# Patient Record
Sex: Female | Born: 1985 | Race: White | Hispanic: No | Marital: Married | Smoking: Never smoker
Health system: Southern US, Community
[De-identification: ages and names within clinical notes are randomized; demographics above are authoritative.]

## PROBLEM LIST (undated history)

## (undated) ENCOUNTER — Inpatient Hospital Stay (HOSPITAL_COMMUNITY): Payer: Self-pay

## (undated) DIAGNOSIS — D649 Anemia, unspecified: Secondary | ICD-10-CM

## (undated) DIAGNOSIS — O26649 Intrahepatic cholestasis of pregnancy, unspecified trimester: Secondary | ICD-10-CM

## (undated) DIAGNOSIS — R030 Elevated blood-pressure reading, without diagnosis of hypertension: Secondary | ICD-10-CM

## (undated) DIAGNOSIS — F32A Depression, unspecified: Secondary | ICD-10-CM

## (undated) DIAGNOSIS — O24419 Gestational diabetes mellitus in pregnancy, unspecified control: Secondary | ICD-10-CM

## (undated) DIAGNOSIS — Z974 Presence of external hearing-aid: Secondary | ICD-10-CM

## (undated) DIAGNOSIS — N920 Excessive and frequent menstruation with regular cycle: Secondary | ICD-10-CM

## (undated) DIAGNOSIS — N946 Dysmenorrhea, unspecified: Secondary | ICD-10-CM

## (undated) DIAGNOSIS — F419 Anxiety disorder, unspecified: Secondary | ICD-10-CM

## (undated) DIAGNOSIS — Z973 Presence of spectacles and contact lenses: Secondary | ICD-10-CM

## (undated) DIAGNOSIS — T7840XA Allergy, unspecified, initial encounter: Secondary | ICD-10-CM

## (undated) DIAGNOSIS — K831 Obstruction of bile duct: Secondary | ICD-10-CM

## (undated) DIAGNOSIS — R011 Cardiac murmur, unspecified: Secondary | ICD-10-CM

## (undated) DIAGNOSIS — E559 Vitamin D deficiency, unspecified: Secondary | ICD-10-CM

## (undated) DIAGNOSIS — H9190 Unspecified hearing loss, unspecified ear: Secondary | ICD-10-CM

## (undated) DIAGNOSIS — O26619 Liver and biliary tract disorders in pregnancy, unspecified trimester: Secondary | ICD-10-CM

## (undated) DIAGNOSIS — I10 Essential (primary) hypertension: Secondary | ICD-10-CM

## (undated) DIAGNOSIS — E538 Deficiency of other specified B group vitamins: Secondary | ICD-10-CM

## (undated) DIAGNOSIS — H6993 Unspecified Eustachian tube disorder, bilateral: Secondary | ICD-10-CM

## (undated) DIAGNOSIS — E119 Type 2 diabetes mellitus without complications: Secondary | ICD-10-CM

## (undated) DIAGNOSIS — F909 Attention-deficit hyperactivity disorder, unspecified type: Secondary | ICD-10-CM

## (undated) DIAGNOSIS — R7303 Prediabetes: Secondary | ICD-10-CM

## (undated) DIAGNOSIS — M1990 Unspecified osteoarthritis, unspecified site: Secondary | ICD-10-CM

## (undated) DIAGNOSIS — F329 Major depressive disorder, single episode, unspecified: Secondary | ICD-10-CM

## (undated) DIAGNOSIS — K219 Gastro-esophageal reflux disease without esophagitis: Secondary | ICD-10-CM

## (undated) HISTORY — PX: CHOLECYSTECTOMY: SHX55

## (undated) HISTORY — DX: Prediabetes: R73.03

## (undated) HISTORY — PX: WISDOM TOOTH EXTRACTION: SHX21

## (undated) HISTORY — DX: Unspecified osteoarthritis, unspecified site: M19.90

## (undated) HISTORY — DX: Gastro-esophageal reflux disease without esophagitis: K21.9

## (undated) HISTORY — DX: Unspecified hearing loss, unspecified ear: H91.90

## (undated) HISTORY — DX: Depression, unspecified: F32.A

## (undated) HISTORY — DX: Anxiety disorder, unspecified: F41.9

## (undated) HISTORY — PX: TONSILLECTOMY: SUR1361

## (undated) HISTORY — DX: Allergy, unspecified, initial encounter: T78.40XA

## (undated) HISTORY — DX: Gestational diabetes mellitus in pregnancy, unspecified control: O24.419

## (undated) HISTORY — DX: Attention-deficit hyperactivity disorder, unspecified type: F90.9

## (undated) HISTORY — DX: Cardiac murmur, unspecified: R01.1

## (undated) HISTORY — DX: Essential (primary) hypertension: I10

## (undated) HISTORY — DX: Major depressive disorder, single episode, unspecified: F32.9

---
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
## 2007-01-12 ENCOUNTER — Emergency Department (HOSPITAL_COMMUNITY): Admission: EM | Admit: 2007-01-12 | Discharge: 2007-01-13 | Payer: Self-pay | Admitting: Emergency Medicine

## 2007-11-25 HISTORY — PX: LAPAROSCOPIC CHOLECYSTECTOMY: SUR755

## 2011-01-07 ENCOUNTER — Other Ambulatory Visit (HOSPITAL_COMMUNITY)
Admission: RE | Admit: 2011-01-07 | Discharge: 2011-01-07 | Disposition: A | Discharge status: Home / Self Care | Payer: BC Managed Care – PPO | Source: Ambulatory Visit | Attending: Family Medicine | Admitting: Family Medicine

## 2011-01-07 DIAGNOSIS — Z124 Encounter for screening for malignant neoplasm of cervix: Secondary | ICD-10-CM | POA: Insufficient documentation

## 2011-11-25 ENCOUNTER — Ambulatory Visit (INDEPENDENT_AMBULATORY_CARE_PROVIDER_SITE_OTHER): Payer: BC Managed Care – PPO

## 2011-11-25 DIAGNOSIS — N3000 Acute cystitis without hematuria: Secondary | ICD-10-CM

## 2012-02-19 ENCOUNTER — Other Ambulatory Visit (HOSPITAL_COMMUNITY)
Admission: RE | Admit: 2012-02-19 | Discharge: 2012-02-19 | Disposition: A | Discharge status: Home / Self Care | Payer: BC Managed Care – PPO | Source: Ambulatory Visit | Attending: Family Medicine | Admitting: Family Medicine

## 2012-02-19 DIAGNOSIS — Z124 Encounter for screening for malignant neoplasm of cervix: Secondary | ICD-10-CM | POA: Insufficient documentation

## 2013-03-24 ENCOUNTER — Ambulatory Visit (INDEPENDENT_AMBULATORY_CARE_PROVIDER_SITE_OTHER): Payer: BC Managed Care – PPO | Admitting: Internal Medicine

## 2013-03-24 VITALS — BP 130/86 | HR 77 | Temp 98.3°F | Resp 18 | Wt 265.0 lb

## 2013-03-24 DIAGNOSIS — R6883 Chills (without fever): Secondary | ICD-10-CM

## 2013-03-24 DIAGNOSIS — M25559 Pain in unspecified hip: Secondary | ICD-10-CM

## 2013-03-24 DIAGNOSIS — R1110 Vomiting, unspecified: Secondary | ICD-10-CM

## 2013-03-24 DIAGNOSIS — M25569 Pain in unspecified knee: Secondary | ICD-10-CM

## 2013-03-24 DIAGNOSIS — K529 Noninfective gastroenteritis and colitis, unspecified: Secondary | ICD-10-CM

## 2013-03-24 LAB — POCT CBC
Granulocyte percent: 77.8 %G (ref 37–80)
HCT, POC: 45.2 % (ref 37.7–47.9)
Hemoglobin: 14.6 g/dL (ref 12.2–16.2)
Lymph, poc: 1.1 (ref 0.6–3.4)
MCV: 88.7 fL (ref 80–97)
POC Granulocyte: 5 (ref 2–6.9)
POC LYMPH PERCENT: 17.6 %L (ref 10–50)
RDW, POC: 13.9 %

## 2013-03-24 LAB — POCT UA - MICROSCOPIC ONLY
Casts, Ur, LPF, POC: NEGATIVE
Mucus, UA: NEGATIVE
RBC, urine, microscopic: NEGATIVE

## 2013-03-24 LAB — POCT URINALYSIS DIPSTICK
Bilirubin, UA: NEGATIVE
Glucose, UA: NEGATIVE
Leukocytes, UA: NEGATIVE
Nitrite, UA: NEGATIVE
pH, UA: 6.5

## 2013-03-24 LAB — POCT URINE PREGNANCY: Preg Test, Ur: NEGATIVE

## 2013-03-24 MED ORDER — IBUPROFEN 600 MG PO TABS: 600.0000 mg | ORAL_TABLET | Freq: Three times a day (TID) | ORAL | Status: DC | PRN

## 2013-03-24 MED ORDER — ONDANSETRON HCL 8 MG PO TABS: 8.0000 mg | ORAL_TABLET | Freq: Three times a day (TID) | ORAL | Status: DC | PRN

## 2013-03-24 NOTE — Progress Notes (Signed)
  Subjective:    Patient ID: Tanya Dorsey, female    DOB: [birth year], 27 y.o.   MRN: [id number]  HPI Nausea and vomiting, no diarrhea or abd pain, has chills and myalgias and skin hurts.    Review of Systems     Objective:   Physical Exam  Vitals reviewed. Constitutional: She is oriented to person, place, and time. She appears well-developed and well-nourished. No distress.  HENT:  Right Ear: External ear normal.  Left Ear: External ear normal.  Nose: Nose normal.  Mouth/Throat: Oropharynx is clear and moist.  Eyes: EOM are normal. Pupils are equal, round, and reactive to light. No scleral icterus.  Neck: Neck supple. No thyromegaly present.  Cardiovascular: Normal rate, regular rhythm and normal heart sounds.   Pulmonary/Chest: Effort normal and breath sounds normal.  Abdominal: Soft. Bowel sounds are normal. She exhibits no distension and no mass. There is tenderness. There is no rebound and no guarding.  Musculoskeletal: Normal range of motion. She exhibits tenderness.  Neurological: She is alert and oriented to person, place, and time. She exhibits normal muscle tone. Coordination normal.  Skin: Skin is warm and dry. No rash noted.  Psychiatric: She has a normal mood and affect. Her behavior is normal.     Results for orders placed in visit on 03/24/13  POCT CBC      Result Value Range   WBC 6.4  4.6 - 10.2 K/uL   Lymph, poc 1.1  0.6 - 3.4   POC LYMPH PERCENT 17.6  10 - 50 %L   MID (cbc) 0.3  0 - 0.9   POC MID % 4.6  0 - 12 %M   POC Granulocyte 5.0  2 - 6.9   Granulocyte percent 77.8  37 - 80 %G   RBC 5.10  4.04 - 5.48 M/uL   Hemoglobin 14.6  12.2 - 16.2 g/dL   HCT, POC 44.0  10.2 - 47.9 %   MCV 88.7  80 - 97 fL   MCH, POC 28.6  27 - 31.2 pg   MCHC 32.3  31.8 - 35.4 g/dL   RDW, POC 72.5     Platelet Count, POC 311  142 - 424 K/uL   MPV 9.7  0 - 99.8 fL  POCT URINE PREGNANCY      Result Value Range   Preg Test, Ur Negative    POCT URINALYSIS DIPSTICK   Result Value Range   Color, UA yellow     Clarity, UA clear     Glucose, UA neg     Bilirubin, UA neg     Ketones, UA trace     Spec Grav, UA 1.015     Blood, UA neg     pH, UA 6.5     Protein, UA neg     Urobilinogen, UA 0.2     Nitrite, UA neg     Leukocytes, UA Negative    POCT UA - MICROSCOPIC ONLY      Result Value Range   WBC, Ur, HPF, POC 0-1     RBC, urine, microscopic neg     Bacteria, U Microscopic trace     Mucus, UA neg     Epithelial cells, urine per micros 2-6     Crystals, Ur, HPF, POC neg     Casts, Ur, LPF, POC neg     Yeast, UA neg          Assessment & Plan:  Gastroenteritis Arthralgias

## 2013-03-24 NOTE — Patient Instructions (Signed)
Viral Gastroenteritis Viral gastroenteritis is also known as stomach flu. This condition affects the stomach and intestinal tract. It can cause sudden diarrhea and vomiting. The illness typically lasts 3 to 8 days. Most people develop an immune response that eventually gets rid of the virus. While this natural response develops, the virus can make you quite ill. CAUSES  Many different viruses can cause gastroenteritis, such as rotavirus or noroviruses. You can catch one of these viruses by consuming contaminated food or water. You may also catch a virus by sharing utensils or other personal items with an infected person or by touching a contaminated surface. SYMPTOMS  The most common symptoms are diarrhea and vomiting. These problems can cause a severe loss of body fluids (dehydration) and a body salt (electrolyte) imbalance. Other symptoms may include:  Fever.  Headache.  Fatigue.  Abdominal pain. DIAGNOSIS  Your caregiver can usually diagnose viral gastroenteritis based on your symptoms and a physical exam. A stool sample may also be taken to test for the presence of viruses or other infections. TREATMENT  This illness typically goes away on its own. Treatments are aimed at rehydration. The most serious cases of viral gastroenteritis involve vomiting so severely that you are not able to keep fluids down. In these cases, fluids must be given through an intravenous line (IV). HOME CARE INSTRUCTIONS   Drink enough fluids to keep your urine clear or pale yellow. Drink small amounts of fluids frequently and increase the amounts as tolerated.  Ask your caregiver for specific rehydration instructions.  Avoid:  Foods high in sugar.  Alcohol.  Carbonated drinks.  Tobacco.  Juice.  Caffeine drinks.  Extremely hot or cold fluids.  Fatty, greasy foods.  Too much intake of anything at one time.  Dairy products until 24 to 48 hours after diarrhea stops.  You may consume probiotics.  Probiotics are active cultures of beneficial bacteria. They may lessen the amount and number of diarrheal stools in adults. Probiotics can be found in yogurt with active cultures and in supplements.  Wash your hands well to avoid spreading the virus.  Only take over-the-counter or prescription medicines for pain, discomfort, or fever as directed by your caregiver. Do not give aspirin to children. Antidiarrheal medicines are not recommended.  Ask your caregiver if you should continue to take your regular prescribed and over-the-counter medicines.  Keep all follow-up appointments as directed by your caregiver. SEEK IMMEDIATE MEDICAL CARE IF:   You are unable to keep fluids down.  You do not urinate at least once every 6 to 8 hours.  You develop shortness of breath.  You notice blood in your stool or vomit. This may look like coffee grounds.  You have abdominal pain that increases or is concentrated in one small area (localized).  You have persistent vomiting or diarrhea.  You have a fever.  The patient is a child younger than 3 months, and he or she has a fever.  The patient is a child older than 3 months, and he or she has a fever and persistent symptoms.  The patient is a child older than 3 months, and he or she has a fever and symptoms suddenly get worse.  The patient is a baby, and he or she has no tears when crying. MAKE SURE YOU:   Understand these instructions.  Will watch your condition.  Will get help right away if you are not doing well or get worse. Document Released: 11/10/2005 Document Revised: 02/02/2012 Document Reviewed: 08/27/2011   ExitCare Patient Information 2013 [employer].  

## 2013-09-06 ENCOUNTER — Ambulatory Visit (INDEPENDENT_AMBULATORY_CARE_PROVIDER_SITE_OTHER): Payer: BC Managed Care – PPO | Admitting: Internal Medicine

## 2013-09-06 VITALS — BP 130/80 | HR 99 | Temp 98.8°F | Resp 16 | Ht 65.5 in | Wt 257.0 lb

## 2013-09-06 DIAGNOSIS — S61409A Unspecified open wound of unspecified hand, initial encounter: Secondary | ICD-10-CM

## 2013-09-06 DIAGNOSIS — M79609 Pain in unspecified limb: Secondary | ICD-10-CM

## 2013-09-06 DIAGNOSIS — S61401A Unspecified open wound of right hand, initial encounter: Secondary | ICD-10-CM

## 2013-09-06 NOTE — Progress Notes (Signed)
This chart was scribed for Tanya Sia, MD by Tanya Dorsey, ED Scribe. This patient was seen in room Room 7 and the patient's care was started at 2:28 PM   Subjective:    Patient ID: Tanya Dorsey, female    DOB: 1986/11/03, 27 y.o.   MRN: [id number]  HPI Tanya Dorsey is a 27 y.o. female who presents to the [facility] complaining of right pointer digit injury. Pt presents with a wound and controled bleeding. Pt states she was outdoors working in the yard and injured her finger on a ceramic metal bird feeder that shattered. Pts last tetanus was within the last 5 years. Pt has pain with bending.  There are no active problems to display for this patient.    Review of Systems  Skin: Positive for wound.      Objective:   Physical Exam  Nursing note and vitals reviewed. Constitutional: She is oriented to person, place, and time. She appears well-developed and well-nourished. No distress.  HENT:  Head: Normocephalic and atraumatic.  Eyes: Pupils are equal, round, and reactive to light.  Neck: Neck supple.  Cardiovascular: Normal rate and regular rhythm.   Pulmonary/Chest: Effort normal. No respiratory distress.  Musculoskeletal: Normal range of motion.  R index with 1.5-2 cm lac at rad aspect of 2nd PIP sens intact incl 2pt discr Tendon not visible Pain w/ extens ag resis jt space not invaded  Neurological: She is alert and oriented to person, place, and time.  Skin: Skin is warm and dry.  Psychiatric: She has a normal mood and affect. Her behavior is normal.    Proc by Tanya Dorsey    Assessment & Plan:  Wound finger  Wound care  sr 7-10d I have completed the patient encounter in its entirety as documented by the scribe, with editing by me where necessary. Tanya Dorsey P. Merla Riches, M.D.

## 2013-09-06 NOTE — Progress Notes (Signed)
Procedure Note: Verbal consent obtained from the patient.  Digital block with 3 cc Lidocaine 2% without epinephrine.  Wound scrubbed with soap and water.  Wound explored.  No foreign bodies or deep structure injury noted.  Wound closed with #3 sutures of 5-0 ethilon (#2 HM, #1SI).  Area cleansed and dressed.  Wound care discussed.  Pt tolerated very well.  Anticipate suture removal in 10 days

## 2013-09-06 NOTE — Patient Instructions (Signed)

## 2013-12-20 LAB — OB RESULTS CONSOLE ANTIBODY SCREEN: Antibody Screen: NEGATIVE

## 2013-12-20 LAB — OB RESULTS CONSOLE HIV ANTIBODY (ROUTINE TESTING): HIV: NONREACTIVE

## 2013-12-20 LAB — OB RESULTS CONSOLE RUBELLA ANTIBODY, IGM: RUBELLA: IMMUNE

## 2013-12-20 LAB — OB RESULTS CONSOLE HEPATITIS B SURFACE ANTIGEN: HEP B S AG: NEGATIVE

## 2013-12-20 LAB — OB RESULTS CONSOLE ABO/RH: ABO/RH(D): NEGATIVE

## 2013-12-20 LAB — OB RESULTS CONSOLE GC/CHLAMYDIA
CHLAMYDIA, DNA PROBE: NEGATIVE
Gonorrhea: NEGATIVE

## 2013-12-20 LAB — OB RESULTS CONSOLE RPR: RPR: NONREACTIVE

## 2013-12-31 ENCOUNTER — Encounter (HOSPITAL_COMMUNITY): Payer: Self-pay | Admitting: Obstetrics and Gynecology

## 2013-12-31 ENCOUNTER — Inpatient Hospital Stay (HOSPITAL_COMMUNITY)
Admission: AD | Admit: 2013-12-31 | Discharge: 2013-12-31 | Disposition: A | Discharge status: Home / Self Care | Payer: 59 | Source: Ambulatory Visit | Attending: Obstetrics and Gynecology | Admitting: Obstetrics and Gynecology

## 2013-12-31 DIAGNOSIS — O360990 Maternal care for other rhesus isoimmunization, unspecified trimester, not applicable or unspecified: Secondary | ICD-10-CM | POA: Insufficient documentation

## 2013-12-31 DIAGNOSIS — N390 Urinary tract infection, site not specified: Secondary | ICD-10-CM | POA: Insufficient documentation

## 2013-12-31 DIAGNOSIS — O209 Hemorrhage in early pregnancy, unspecified: Secondary | ICD-10-CM

## 2013-12-31 DIAGNOSIS — O26859 Spotting complicating pregnancy, unspecified trimester: Secondary | ICD-10-CM | POA: Insufficient documentation

## 2013-12-31 DIAGNOSIS — O2390 Unspecified genitourinary tract infection in pregnancy, unspecified trimester: Secondary | ICD-10-CM | POA: Insufficient documentation

## 2013-12-31 LAB — HCG, QUANTITATIVE, PREGNANCY: HCG, BETA CHAIN, QUANT, S: 14817 m[IU]/mL — AB (ref <5)

## 2013-12-31 LAB — ABO/RH: ABO/RH(D): O NEG

## 2013-12-31 MED ORDER — RHO D IMMUNE GLOBULIN 1500 UNIT/2ML IJ SOLN
300.0000 ug | Freq: Once | INTRAMUSCULAR | Status: AC
  Administered 2013-12-31: 300 ug via INTRAMUSCULAR
  Filled 2013-12-31: qty 2

## 2013-12-31 MED ORDER — AMPICILLIN 500 MG PO CAPS: 500.0000 mg | ORAL_CAPSULE | Freq: Three times a day (TID) | ORAL | Status: DC

## 2013-12-31 NOTE — MAU Provider Note (Signed)
History   CSN: [id number]  Arrival date and time: 12/31/13 1503   Chief Complaint  Patient presents with  . Labs Only   HPI Pt presents at 6w 5d for repeat quant hcg and rhogam as instructed by Dr. Stefano GaulStringer.  Pt reports scant amt of brown spotting this past Tuesday but none since.  She denies pain, weakness or cramping.  Per Dr. Debria GarretStringer's note, pt also to be treated for UTI due to positive urine culture with enterococcus.  Pt had been called by [facility] and messages left.  She returned the call this AM.   OB History   Grav Para Term Preterm Abortions TAB SAB Ect Mult Living   1               Past Medical History  Diagnosis Date  . Depression   . Hypertension     Past Surgical History  Procedure Laterality Date  . Cholecystectomy      Family History  Problem Relation Age of Onset  . Diabetes Mother   . Hypertension Mother   . Hypertension Father     History  Substance Use Topics  . Smoking status: Never Smoker   . Smokeless tobacco: Not on file  . Alcohol Use: No    Allergies: No Known Allergies  Prescriptions prior to admission  Medication Sig Dispense Refill  . BuPROPion HCl ER, XL, (FORFIVO XL) 450 MG TB24 Take by mouth.      Marland Kitchen. ibuprofen (ADVIL,MOTRIN) 600 MG tablet Take 1 tablet (600 mg total) by mouth every 8 (eight) hours as needed for pain.  30 tablet  0  . lisdexamfetamine (VYVANSE) 40 MG capsule Take 40 mg by mouth every morning.      . Norgestimate-Ethinyl Estradiol Triphasic (ORTHO TRI-CYCLEN LO) 0.18/0.215/0.25 MG-25 MCG tab Take 1 tablet by mouth daily.      . ondansetron (ZOFRAN) 8 MG tablet Take 1 tablet (8 mg total) by mouth every 8 (eight) hours as needed for nausea.  20 tablet  0    Review of Systems  Constitutional: Negative.   HENT: Negative.   Eyes: Negative.   Respiratory: Negative.   Cardiovascular: Negative.   Gastrointestinal: Negative.   Genitourinary: Negative.   Musculoskeletal: Negative.   Skin: Negative.   Neurological:  Negative.   Endo/Heme/Allergies: Negative.   Psychiatric/Behavioral: Negative.    Physical Exam   Blood pressure 133/82, pulse 98, temperature 98 F (36.7 C), resp. rate 18, last menstrual period 11/14/2013.  Physical Exam  Constitutional: She is oriented to person, place, and time. She appears well-developed and well-nourished.  HENT:  Head: Normocephalic.  Right Ear: External ear normal.  Left Ear: External ear normal.  Nose: Nose normal.  Eyes: Conjunctivae are normal. Pupils are equal, round, and reactive to light.  Neck: Normal range of motion.  Cardiovascular: Normal rate, regular rhythm and intact distal pulses.   Respiratory: Effort normal and breath sounds normal.  GI: Soft. Bowel sounds are normal. There is no tenderness.  Genitourinary:  Pelvic exam deferred  Musculoskeletal: Normal range of motion.  Neurological: She is alert and oriented to person, place, and time. She has normal reflexes.  Skin: Skin is warm and dry.  Psychiatric: She has a normal mood and affect. Her behavior is normal.    MAU Course  Procedures Results for orders placed during the hospital encounter of 12/31/13 (from the past 24 hour(s))  RH IG WORKUP (INCLUDES ABO/RH)     Status: None   Collection Time  12/31/13  3:32 PM      Result Value Range   Gestational Age(Wks) 6     ABO/RH(D) O NEG     Antibody Screen NEG     Unit Number [id number]     Blood Component Type RHIG     Unit division 00     Status of Unit ISSUED     Transfusion Status OK TO TRANSFUSE    HCG, QUANTITATIVE, PREGNANCY     Status: Abnormal   Collection Time    12/31/13  3:32 PM      Result Value Range   hCG, Beta Chain, Quant, S 14817 (*) <5 mIU/mL    Assessment and Plan  Pregnancy at 6w 5d with increased quant hCG Vaginal bleeding - resolved Rh negative Enterococcus UTI  Rhogam ordered and pt received. Rx Ampicillin 500mg  tid x 7 days to pharmacy with instructions to complete all medications. Pt to f/u  at [facility] within the upcoming week for ultrasound to confirm viability-pt to call office to sched appt on Monday  Teri Legacy O. 12/31/2013, 4:36 PM

## 2013-12-31 NOTE — MAU Note (Signed)
Pt presents for repeat labs and rhogam. Denies any pain but had some spotting on Tuesday that brownish in color

## 2013-12-31 NOTE — Discharge Instructions (Signed)
° °Pelvic Rest °Pelvic rest is sometimes recommended for women when:  °· The placenta is partially or completely covering the opening of the cervix (placenta previa). °· There is bleeding between the uterine wall and the amniotic sac in the first trimester (subchorionic hemorrhage). °· The cervix begins to open without labor starting (incompetent cervix, cervical insufficiency). °· The labor is too early (preterm labor). °HOME CARE INSTRUCTIONS °· Do not have sexual intercourse, stimulation, or an orgasm. °· Do not use tampons, douche, or put anything in the vagina. °· Do not lift anything over 10 pounds (4.5 kg). °· Avoid strenuous activity or straining your pelvic muscles. °SEEK MEDICAL CARE IF:  °· You have any vaginal bleeding during pregnancy. Treat this as a potential emergency. °· You have cramping pain felt low in the stomach (stronger than menstrual cramps). °· You notice vaginal discharge (watery, mucus, or bloody). °· You have a low, dull backache. °· There are regular contractions or uterine tightening. °SEEK IMMEDIATE MEDICAL CARE IF: °You have vaginal bleeding and have placenta previa.  °Document Released: 03/07/2011 Document Revised: 02/02/2012 Document Reviewed: 03/07/2011 °ExitCare® Patient Information ©2014 [employer]. ° °Vaginal Bleeding During Pregnancy, First Trimester °A small amount of bleeding (spotting) from the vagina is relatively common in early pregnancy. It usually stops on its own. Various things may cause bleeding or spotting in early pregnancy. Some bleeding may be related to the pregnancy, and some may not. In most cases, the bleeding is normal and is not a problem. However, bleeding can also be a sign of something serious. Be sure to tell your health care provider about any vaginal bleeding right away. °Some possible causes of vaginal bleeding during the first trimester include: °· Infection or inflammation of the cervix. °· Growths (polyps) on the cervix. °· Miscarriage or  threatened miscarriage. °· Pregnancy tissue has developed outside of the uterus and in a fallopian tube (tubal pregnancy). °· Tiny cysts have developed in the uterus instead of pregnancy tissue (molar pregnancy). °HOME CARE INSTRUCTIONS  °Watch your condition for any changes. The following actions may help to lessen any discomfort you are feeling: °· Follow your health care provider's instructions for limiting your activity. If your health care provider orders bed rest, you may need to stay in bed and only get up to use the bathroom. However, your health care provider may allow you to continue light activity. °· If needed, make plans for someone to help with your regular activities and responsibilities while you are on bed rest. °· Keep track of the number of pads you use each day, how often you change pads, and how soaked (saturated) they are. Write this down. °· Do not use tampons. Do not douche. °· Do not have sexual intercourse or orgasms until approved by your health care provider. °· If you pass any tissue from your vagina, save the tissue so you can show it to your health care provider. °· Only take over-the-counter or prescription medicines as directed by your health care provider. °· Do not take aspirin because it can make you bleed. °· Keep all follow-up appointments as directed by your health care provider. °SEEK MEDICAL CARE IF: °· You have any vaginal bleeding during any part of your pregnancy. °· You have cramps or labor pains. °SEEK IMMEDIATE MEDICAL CARE IF:  °· You have severe cramps in your back or belly (abdomen). °· You have a fever, not controlled by medicine. °· You pass large clots or tissue from your vagina. °· Your bleeding   increases. °· You feel lightheaded or weak, or you have fainting episodes. °· You have chills. °· You are leaking fluid or have a gush of fluid from your vagina. °· You pass out while having a bowel movement. °MAKE SURE YOU: °· Understand these instructions. °· Will watch  your condition. °· Will get help right away if you are not doing well or get worse. °Document Released: 08/20/2005 Document Revised: 08/31/2013 Document Reviewed: 07/18/2013 °ExitCare® Patient Information ©2014 [employer]. ° °

## 2014-01-01 LAB — RH IG WORKUP (INCLUDES ABO/RH)
ABO/RH(D): O NEG
Antibody Screen: NEGATIVE
GESTATIONAL AGE(WKS): 6
Unit division: 0

## 2014-06-28 ENCOUNTER — Encounter: Payer: 59 | Attending: Certified Nurse Midwife

## 2014-06-28 VITALS — Ht 64.0 in | Wt 270.8 lb

## 2014-06-28 DIAGNOSIS — Z713 Dietary counseling and surveillance: Secondary | ICD-10-CM | POA: Diagnosis present

## 2014-06-28 DIAGNOSIS — O99810 Abnormal glucose complicating pregnancy: Secondary | ICD-10-CM | POA: Insufficient documentation

## 2014-06-30 NOTE — Progress Notes (Signed)
  Patient was seen on 06/28/14 for Gestational Diabetes self-management class at the [facility]. The following learning objectives were met by the patient during this course:   States the definition of Gestational Diabetes  States why dietary management is important in controlling blood glucose  Describes the effects of carbohydrates on blood glucose levels  Demonstrates ability to create a balanced meal plan  Demonstrates carbohydrate counting   States when to check blood glucose levels  Demonstrates proper blood glucose monitoring techniques  States the effect of stress and exercise on blood glucose levels  States the importance of limiting caffeine and abstaining from alcohol and smoking  Plan:  Aim for 2 Carb Choices per meal (30 grams) +/- 1 either way for breakfast Aim for 3 Carb Choices per meal (45 grams) +/- 1 either way from lunch and dinner Aim for 1-2 Carbs per snack Begin reading food labels for Total Carbohydrate and sugar grams of foods Consider  increasing your activity level by walking daily as tolerated Begin checking BG before breakfast and 1-2 hours after first bit of breakfast, lunch and dinner after  as directed by MD  Take medication  as directed by MD  Blood glucose monitor given: Accu Chek Nano BG Monitoring Kit Lot [id number] Exp: 02/22/15 Blood glucose reading:119m/dl  Patient instructed to monitor glucose levels: FBS: 60 - <90 2 hour: <120  Patient received the following handouts:  Nutrition Diabetes and Pregnancy  Carbohydrate Counting List  Meal Planning worksheet  Patient will be seen for follow-up as needed.

## 2014-07-18 ENCOUNTER — Other Ambulatory Visit: Payer: Self-pay

## 2014-07-25 ENCOUNTER — Other Ambulatory Visit (HOSPITAL_COMMUNITY): Payer: 59

## 2014-07-26 ENCOUNTER — Telehealth (HOSPITAL_COMMUNITY): Payer: Self-pay | Admitting: *Deleted

## 2014-07-26 ENCOUNTER — Encounter (HOSPITAL_COMMUNITY): Payer: Self-pay | Admitting: *Deleted

## 2014-07-26 NOTE — Telephone Encounter (Signed)
Preadmission screen  

## 2014-07-28 ENCOUNTER — Ambulatory Visit (HOSPITAL_COMMUNITY)
Admission: RE | Admit: 2014-07-28 | Discharge: 2014-07-28 | Disposition: A | Discharge status: Home / Self Care | Payer: 59 | Source: Ambulatory Visit | Attending: Family Medicine | Admitting: Family Medicine

## 2014-07-28 ENCOUNTER — Encounter (HOSPITAL_COMMUNITY): Payer: Self-pay

## 2014-07-28 ENCOUNTER — Ambulatory Visit (HOSPITAL_COMMUNITY)
Admission: RE | Admit: 2014-07-28 | Discharge: 2014-07-28 | Disposition: A | Discharge status: Home / Self Care | Payer: 59 | Source: Ambulatory Visit | Attending: Obstetrics and Gynecology | Admitting: Obstetrics and Gynecology

## 2014-07-28 DIAGNOSIS — O26619 Liver and biliary tract disorders in pregnancy, unspecified trimester: Secondary | ICD-10-CM | POA: Insufficient documentation

## 2014-07-28 DIAGNOSIS — K838 Other specified diseases of biliary tract: Secondary | ICD-10-CM | POA: Insufficient documentation

## 2014-07-28 DIAGNOSIS — O26613 Liver and biliary tract disorders in pregnancy, third trimester: Secondary | ICD-10-CM

## 2014-07-28 DIAGNOSIS — O99810 Abnormal glucose complicating pregnancy: Secondary | ICD-10-CM | POA: Diagnosis not present

## 2014-07-28 DIAGNOSIS — K831 Obstruction of bile duct: Secondary | ICD-10-CM

## 2014-07-28 HISTORY — DX: Liver and biliary tract disorders in pregnancy, unspecified trimester: O26.619

## 2014-07-28 HISTORY — DX: Intrahepatic cholestasis of pregnancy, unspecified trimester: O26.649

## 2014-07-28 HISTORY — DX: Obstruction of bile duct: K83.1

## 2014-07-28 NOTE — Consult Note (Signed)
Maternal Fetal Medicine Consultation  Requesting Provider(s): Eustace Moore, NP; Kirkland Hun, MD  Reason for consultation: Cholestasis of pregnancy   HPI: Tanya Dorsey is a 28 year-old G1P0 currently at 36w 4d who was seen for consultation due to a recent diagnosis of Cholestasis of pregnancy.  Tanya Dorsey reports that in late August, she developed diffuse itching and swelling of Tanya lower extremities.  She underwent a work up for cholestasis that revealed mildly elevated bile Dorsey of 11 and normal LFTs.  She was started on Ursodeoxycholic acid with some improvement in her symptoms.  Since that time, she has been undergoing antepartum fetal testing and is tentatively scheduled for induction of labor at 37 weeks.  Tanya Dorsey prenatal course has also been complicated by A2 GDM.  She is currently on Glyburide 5 mg q AM and 2.5 mg qHS.  She reports that her fingerstick values are mostly within target range and well-controlled on Tanya regimen. She is without complaints today.  She denies uterine contractions, vaginal bleeding or leakage of fluid.  OB History: OB History   Grav Para Term Preterm Abortions TAB SAB Ect Mult Living        PMH:  Past Medical History  Diagnosis Date  . Depression   . Hypertension   . Gestational diabetes mellitus, antepartum   . Cholestasis of pregnancy     PSH:  Past Surgical History  Procedure Laterality Date  . Cholecystectomy    . Wisdom tooth extraction     Meds:  Actigal, Glyburide, Prenatal vitamins  Allergies: No Known Allergies  FH:  Family History  Problem Relation Age of Onset  . Diabetes Mother   . Hypertension Mother   . Hypertension Father    Soc:  History   Social History  . Marital Status: Married    Spouse Name: N/A    Number of Children: N/A  . Years of Education: N/A   Occupational History  . Not on file.   Social History Main Topics  . Smoking status: Never Smoker   . Smokeless tobacco: Not  on file  . Alcohol Use: No  . Drug Use: No  . Sexual Activity: Yes    Birth Control/ Protection: Pill   Other Topics Concern  . Not on file   Social History Narrative  . No narrative on file     NST - reactive.  No contractions noted.  PE:  124/88, 98, 278#  A/P: 1) Single IUP at 36w 4d         2) A2 GDM - currently well controlled on Glyburide 2.5 mg / 5 mg daily per Dorsey report.         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  We briefly reviewed Tanya management of cholestasis of pregnancy.  There is a higher risk of stillbirth with cholestasis of pregnancy.  While Tanya risk of still birth appears to be correlated to Tanya level of bile Dorsey abnormality, there is still a risk of stillbirth at lower abnormal values.  Recommend continued antepartum fetal testing and concur with planned induction at 37 weeks as scheduled.   Thank you for Tanya opportunity to be a part of Tanya care of [facility]. Please contact our office if we can be of further assistance.   I spent approximately 15 minutes with Tanya Dorsey with over  50% of time spent in face-to-face counseling.  Alpha Gula, MD Maternal Fetal Medicine

## 2014-07-29 DIAGNOSIS — O24419 Gestational diabetes mellitus in pregnancy, unspecified control: Secondary | ICD-10-CM

## 2014-07-29 DIAGNOSIS — O26613 Liver and biliary tract disorders in pregnancy, third trimester: Secondary | ICD-10-CM

## 2014-07-29 DIAGNOSIS — F988 Other specified behavioral and emotional disorders with onset usually occurring in childhood and adolescence: Secondary | ICD-10-CM | POA: Insufficient documentation

## 2014-07-29 DIAGNOSIS — F32A Depression, unspecified: Secondary | ICD-10-CM | POA: Insufficient documentation

## 2014-07-29 DIAGNOSIS — O26899 Other specified pregnancy related conditions, unspecified trimester: Secondary | ICD-10-CM | POA: Insufficient documentation

## 2014-07-29 DIAGNOSIS — K831 Obstruction of bile duct: Secondary | ICD-10-CM | POA: Insufficient documentation

## 2014-07-29 DIAGNOSIS — F411 Generalized anxiety disorder: Secondary | ICD-10-CM | POA: Insufficient documentation

## 2014-07-29 DIAGNOSIS — Z6791 Unspecified blood type, Rh negative: Secondary | ICD-10-CM | POA: Insufficient documentation

## 2014-07-29 DIAGNOSIS — F329 Major depressive disorder, single episode, unspecified: Secondary | ICD-10-CM | POA: Insufficient documentation

## 2014-07-29 DIAGNOSIS — M722 Plantar fascial fibromatosis: Secondary | ICD-10-CM | POA: Insufficient documentation

## 2014-07-29 HISTORY — DX: Gestational diabetes mellitus in pregnancy, unspecified control: O24.419

## 2014-07-29 NOTE — H&P (Signed)
Tanya Dorsey is a 28 y.o. female, G1P0 at 37.1 weeks, presenting for IOL for ICP. Also A2GDM. Pt reports active fetus. Denies VB, LOF or ctxs.  Patient Active Problem List   Diagnosis Date Noted  . GDM, class A2 07/29/2014  . 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 Depression 07/29/2014  . ADD (attention deficit disorder) 07/29/2014  . Rh negative, maternal 07/29/2014  . Severe obesity (BMI >= 40) 07/29/2014  . Plantar fasciitis 07/29/2014  . Anxiety state, unspecified 07/29/2014    History of present pregnancy: Patient entered care at 5.1 weeks.   EDC of 08/21/14 was established by sure LMP and that was consistent w/ 7.4 wk sono.   Anatomy scan: 19.4 weeks, with anterior placenta. 4 chamber, LVOT, 3VC and placental cord insertion not well seen; f/u suggested.   Additional [place] evaluations: 7.4 wks for dating and viability: No obvious sign of SCH. 12.4 wks; first trimester screen - Singleton pregnancy. Anteverted uterus. Amnion seen. Normal fluid. Placenta appears to be anterior. Normal NT. Cervix closed. Normal adnexa. 24 wks: EFW 63rd%tile, AFI normal, anterior placenta, normal linear growth, transverse presentation (head left), 4 chamber and LVOT seen; cx closed, anatomy scan complete. 32.3 wks: EFW 6lbs6oz, >97th%tile, normal fluid, BPP 8/8, vtx. 33.4 wks: BPP 8/8, cephalic, AFI 17 cm. 34.2 wks: EFW 6lbs11oz (96.7%), AFI 19.46 cm (70%), BPP 8/8, anterior placenta. 35.4 wks: EFW 3737g (8lbs 4 oz) (90%), AFI 20.6 cm (75%tile), BPP 8/8. 36.1 wks: BPP 8/8.  Significant prenatal events: Spotting around 5 wks, evaluated in MAU and received Rhophylac, received injection again at 28 wks. Struggled w/ plantar fascitis throughout the pregnancy and diffuse itching and swelling of lower extremities  around 35 wks. Was started on UDCA for suspected ICP which improved her sxs some. Was to f/u with Podiatrist for persistent issues w/ plantar fascitis and the  possible need for cortisone shots. Elevated 1hr and abnormal 3hr; started on Glyburide at 33.1 wks. Underwent antepartum fetal testing at 32 wks.  Last evaluation: @ 36.1 wks. Cervix 0/20/-3.  OB History   Grav Para Term Preterm Abortions TAB SAB Ect Mult Living       Past Medical History  Diagnosis Date  . Depression   . Hypertension   . Gestational diabetes mellitus, antepartum   . Cholestasis of pregnancy    Past Surgical History  Procedure Laterality Date  . Cholecystectomy    . Wisdom tooth extraction     Family History: family history includes Diabetes in her mother; Hypertension in her father and mother.Heart disease, mitral valve d/o and h/o tuberculosis in her MGM, heart disease, asthma, DM, h/o CVA in her MGF, hypothyroidism in her mother and h/o kidney infection in her father and sister. Social History:  reports that she has never smoked. She does not have any smokeless tobacco history on file. She reports that she does not drink alcohol or use illicit drugs.Pt is a Caucasian female who has a post graduate degree and works as a Veterinary surgeon. She is married to [place] who is present and supportive. Pt received both Tdap and flu vaccines this pregnancy.   Prenatal Transfer Tool  Maternal Diabetes: Yes:  Diabetes Type:  Insulin/Medication controlled Genetic Screening: Normal first trimester screen and AFP Maternal Ultrasounds/Referrals: Normal Fetal Ultrasounds or other Referrals:  Referred to [facility] Fetal Medicine MFM consultation on 07/28/14 due to ICP; MFM rec IOL at 37 wks and to continue  APFT until that time. Reactive NST Maternal Substance Abuse:  No Significant Maternal Medications:  Meds include: Other: Glyburide 5 mg q AM and 2.5 mg QHS, PNV, Ursodeoxycholic acid 300 mg po bid (started around 35 wks)  Significant Maternal Lab Results: Lab values include: Group B Strep negative, Rh negative    ROS: +FM  No Known Allergies other than seasonal      Last menstrual period 11/14/2013.  Chest clear Heart RRR without murmur Abd gravid, NT, FH = S>D Pelvic: closed/thick/high Ext: 2+DTRs bilaterally, no clonus, trace edema.  FHR: Reactive NST at 35.4, 36.1 and 36.4 wks UCs: None  Prenatal labs: ABO, Rh: --/--/O NEG (02/07 1532) Antibody: NEG (02/07 1532) Rubella:   Immune RPR: Nonreactive (01/27 0000)  HBsAg: Negative (01/27 0000)  HIV: Non-reactive (01/27 0000)  GBS:  Neg Sickle cell/Hgb electrophoresis: Not done Pap: 11/26/11 GC: Neg on 12/20/13 Chlamydia: Neg on 12/20/13 Genetic screenings: Normal first trimester screen, normal AFP Glucola: Abnormal 1hr and 3hr Other: NOB Hbg 14.1   Urine culture pos for >100K enterococcus species - treated; TOC neg 07/18/14: Mildly elevated bile acids of 11; normal LFTs    Assessment/Plan: IUP at 37.1 wks IOL for ICP A2GDM Unfavorable cervix Elevated BMI (>40) RH neg H/O depression - no meds H/O anxiety - no meds ADD - no meds GBS neg  Plan: Admitted to BS per c/w Dr. Sallye Ober. Routine [facility] orders. CBGs q 4 hrs Continue UDCA Continue Glyburide Rv'd options for induction w/ pt, including cytotec, foley bulb, AROM and pitocin. In light of unfavorable cx, will proceed w/ cervical ripening w/ cytotec. R&B of induction rv'd w/ pt and family, including failure method, need for serial induction, requirement for C/S, need for pitocin or further intervention. Pt and family seem to understand these risks and wish to proceed w/ induction.  Robyne Askew, MS 08/01/14 @ 8:50 PM

## 2014-08-01 ENCOUNTER — Inpatient Hospital Stay (HOSPITAL_COMMUNITY)
Admission: RE | Admit: 2014-08-01 | Discharge: 2014-08-05 | DRG: 765 | Disposition: A | Discharge status: Home / Self Care | Payer: 59 | Source: Ambulatory Visit | Attending: Obstetrics and Gynecology | Admitting: Obstetrics and Gynecology

## 2014-08-01 ENCOUNTER — Encounter (HOSPITAL_COMMUNITY): Payer: Self-pay

## 2014-08-01 VITALS — BP 119/65 | HR 104 | Temp 97.8°F | Resp 18 | Ht 64.0 in | Wt 278.0 lb

## 2014-08-01 DIAGNOSIS — Z8249 Family history of ischemic heart disease and other diseases of the circulatory system: Secondary | ICD-10-CM | POA: Diagnosis not present

## 2014-08-01 DIAGNOSIS — Z825 Family history of asthma and other chronic lower respiratory diseases: Secondary | ICD-10-CM

## 2014-08-01 DIAGNOSIS — K838 Other specified diseases of biliary tract: Secondary | ICD-10-CM | POA: Diagnosis present

## 2014-08-01 DIAGNOSIS — Z6841 Body Mass Index (BMI) 40.0 and over, adult: Secondary | ICD-10-CM

## 2014-08-01 DIAGNOSIS — O99814 Abnormal glucose complicating childbirth: Secondary | ICD-10-CM | POA: Diagnosis present

## 2014-08-01 DIAGNOSIS — O99344 Other mental disorders complicating childbirth: Secondary | ICD-10-CM | POA: Diagnosis present

## 2014-08-01 DIAGNOSIS — M722 Plantar fascial fibromatosis: Secondary | ICD-10-CM | POA: Diagnosis present

## 2014-08-01 DIAGNOSIS — F329 Major depressive disorder, single episode, unspecified: Secondary | ICD-10-CM | POA: Diagnosis present

## 2014-08-01 DIAGNOSIS — Z833 Family history of diabetes mellitus: Secondary | ICD-10-CM

## 2014-08-01 DIAGNOSIS — O9902 Anemia complicating childbirth: Secondary | ICD-10-CM | POA: Diagnosis present

## 2014-08-01 DIAGNOSIS — O3663X1 Maternal care for excessive fetal growth, third trimester, fetus 1: Secondary | ICD-10-CM

## 2014-08-01 DIAGNOSIS — F3289 Other specified depressive episodes: Secondary | ICD-10-CM | POA: Diagnosis present

## 2014-08-01 DIAGNOSIS — O26619 Liver and biliary tract disorders in pregnancy, unspecified trimester: Secondary | ICD-10-CM | POA: Diagnosis present

## 2014-08-01 DIAGNOSIS — O3660X0 Maternal care for excessive fetal growth, unspecified trimester, not applicable or unspecified: Secondary | ICD-10-CM | POA: Diagnosis present

## 2014-08-01 DIAGNOSIS — K831 Obstruction of bile duct: Secondary | ICD-10-CM | POA: Diagnosis present

## 2014-08-01 DIAGNOSIS — O26649 Intrahepatic cholestasis of pregnancy, unspecified trimester: Secondary | ICD-10-CM | POA: Diagnosis present

## 2014-08-01 DIAGNOSIS — Z823 Family history of stroke: Secondary | ICD-10-CM | POA: Diagnosis not present

## 2014-08-01 DIAGNOSIS — O99214 Obesity complicating childbirth: Secondary | ICD-10-CM

## 2014-08-01 DIAGNOSIS — O24419 Gestational diabetes mellitus in pregnancy, unspecified control: Secondary | ICD-10-CM

## 2014-08-01 DIAGNOSIS — F32A Depression, unspecified: Secondary | ICD-10-CM

## 2014-08-01 DIAGNOSIS — D649 Anemia, unspecified: Secondary | ICD-10-CM | POA: Diagnosis present

## 2014-08-01 DIAGNOSIS — E669 Obesity, unspecified: Secondary | ICD-10-CM | POA: Diagnosis present

## 2014-08-01 DIAGNOSIS — F411 Generalized anxiety disorder: Secondary | ICD-10-CM

## 2014-08-01 DIAGNOSIS — F988 Other specified behavioral and emotional disorders with onset usually occurring in childhood and adolescence: Secondary | ICD-10-CM

## 2014-08-01 DIAGNOSIS — O360990 Maternal care for other rhesus isoimmunization, unspecified trimester, not applicable or unspecified: Secondary | ICD-10-CM | POA: Diagnosis present

## 2014-08-01 DIAGNOSIS — O26613 Liver and biliary tract disorders in pregnancy, third trimester: Secondary | ICD-10-CM

## 2014-08-01 LAB — OB RESULTS CONSOLE GBS: GBS: NEGATIVE

## 2014-08-01 LAB — CBC
HEMATOCRIT: 35.1 % — AB (ref 36.0–46.0)
Hemoglobin: 11.6 g/dL — ABNORMAL LOW (ref 12.0–15.0)
MCH: 26.7 pg (ref 26.0–34.0)
MCHC: 33 g/dL (ref 30.0–36.0)
MCV: 80.9 fL (ref 78.0–100.0)
Platelets: 254 10*3/uL (ref 150–400)
RBC: 4.34 MIL/uL (ref 3.87–5.11)
RDW: 14.1 % (ref 11.5–15.5)
WBC: 11.6 10*3/uL — ABNORMAL HIGH (ref 4.0–10.5)

## 2014-08-01 LAB — GLUCOSE, CAPILLARY: GLUCOSE-CAPILLARY: 111 mg/dL — AB (ref 70–99)

## 2014-08-01 MED ORDER — ACETAMINOPHEN 325 MG PO TABS: 650.0000 mg | ORAL_TABLET | ORAL | Status: DC | PRN

## 2014-08-01 MED ORDER — FLEET ENEMA 7-19 GM/118ML RE ENEM: 1.0000 | ENEMA | RECTAL | Status: DC | PRN

## 2014-08-01 MED ORDER — TERBUTALINE SULFATE 1 MG/ML IJ SOLN: 0.2500 mg | Freq: Once | INTRAMUSCULAR | Status: AC | PRN

## 2014-08-01 MED ORDER — LACTATED RINGERS IV SOLN
INTRAVENOUS | Status: DC
  Administered 2014-08-01: 21:00:00 via INTRAVENOUS

## 2014-08-01 MED ORDER — GLYBURIDE 2.5 MG PO TABS
2.5000 mg | ORAL_TABLET | Freq: Every day | ORAL | Status: DC
  Administered 2014-08-01: 2.5 mg via ORAL
  Filled 2014-08-01 (×2): qty 1

## 2014-08-01 MED ORDER — ONDANSETRON HCL 4 MG/2ML IJ SOLN: 4.0000 mg | Freq: Four times a day (QID) | INTRAMUSCULAR | Status: DC | PRN

## 2014-08-01 MED ORDER — GLYBURIDE 5 MG PO TABS
5.0000 mg | ORAL_TABLET | Freq: Every day | ORAL | Status: DC
  Administered 2014-08-02: 5 mg via ORAL
  Filled 2014-08-01 (×2): qty 1

## 2014-08-01 MED ORDER — CITRIC ACID-SODIUM CITRATE 334-500 MG/5ML PO SOLN
30.0000 mL | ORAL | Status: DC | PRN
Start: 2014-08-01 — End: 2014-08-02
  Administered 2014-08-02: 30 mL via ORAL
  Filled 2014-08-01: qty 15

## 2014-08-01 MED ORDER — OXYTOCIN 40 UNITS IN LACTATED RINGERS INFUSION - SIMPLE MED: 62.5000 mL/h | INTRAVENOUS | Status: DC

## 2014-08-01 MED ORDER — MISOPROSTOL 25 MCG QUARTER TABLET
25.0000 ug | ORAL_TABLET | ORAL | Status: DC | PRN
  Administered 2014-08-01 – 2014-08-02 (×4): 25 ug via VAGINAL
  Filled 2014-08-01 (×4): qty 0.25

## 2014-08-01 MED ORDER — OXYTOCIN BOLUS FROM INFUSION: 500.0000 mL | INTRAVENOUS | Status: DC

## 2014-08-01 MED ORDER — OXYCODONE-ACETAMINOPHEN 5-325 MG PO TABS: 1.0000 | ORAL_TABLET | ORAL | Status: DC | PRN

## 2014-08-01 MED ORDER — LIDOCAINE HCL (PF) 1 % IJ SOLN: 30.0000 mL | INTRAMUSCULAR | Status: DC | PRN

## 2014-08-01 MED ORDER — OXYCODONE-ACETAMINOPHEN 5-325 MG PO TABS: 2.0000 | ORAL_TABLET | ORAL | Status: DC | PRN

## 2014-08-01 MED ORDER — URSODIOL 300 MG PO CAPS
300.0000 mg | ORAL_CAPSULE | Freq: Two times a day (BID) | ORAL | Status: DC
  Administered 2014-08-01 – 2014-08-02 (×2): 300 mg via ORAL
  Filled 2014-08-01 (×4): qty 1

## 2014-08-01 MED ORDER — LACTATED RINGERS IV SOLN: 500.0000 mL | INTRAVENOUS | Status: DC | PRN

## 2014-08-01 MED ORDER — NALBUPHINE HCL 10 MG/ML IJ SOLN: 5.0000 mg | INTRAMUSCULAR | Status: DC | PRN

## 2014-08-02 ENCOUNTER — Encounter (HOSPITAL_COMMUNITY): Payer: Self-pay

## 2014-08-02 ENCOUNTER — Encounter (HOSPITAL_COMMUNITY): Payer: 59 | Admitting: Anesthesiology

## 2014-08-02 ENCOUNTER — Inpatient Hospital Stay (HOSPITAL_COMMUNITY): Payer: 59 | Admitting: Anesthesiology

## 2014-08-02 ENCOUNTER — Encounter (HOSPITAL_COMMUNITY)
Admission: RE | Disposition: A | Discharge status: Home / Self Care | Payer: Self-pay | Source: Ambulatory Visit | Attending: Obstetrics and Gynecology

## 2014-08-02 ENCOUNTER — Inpatient Hospital Stay (HOSPITAL_COMMUNITY): Payer: 59

## 2014-08-02 LAB — CBC
HCT: 35 % — ABNORMAL LOW (ref 36.0–46.0)
HEMOGLOBIN: 11.4 g/dL — AB (ref 12.0–15.0)
MCH: 26.2 pg (ref 26.0–34.0)
MCHC: 32.6 g/dL (ref 30.0–36.0)
MCV: 80.5 fL (ref 78.0–100.0)
Platelets: 264 10*3/uL (ref 150–400)
RBC: 4.35 MIL/uL (ref 3.87–5.11)
RDW: 14.3 % (ref 11.5–15.5)
WBC: 16.3 10*3/uL — ABNORMAL HIGH (ref 4.0–10.5)

## 2014-08-02 LAB — GLUCOSE, CAPILLARY
GLUCOSE-CAPILLARY: 89 mg/dL (ref 70–99)
Glucose-Capillary: 124 mg/dL — ABNORMAL HIGH (ref 70–99)
Glucose-Capillary: 75 mg/dL (ref 70–99)
Glucose-Capillary: 77 mg/dL (ref 70–99)
Glucose-Capillary: 82 mg/dL (ref 70–99)
Glucose-Capillary: 93 mg/dL (ref 70–99)

## 2014-08-02 LAB — CREATININE, SERUM
CREATININE: 0.64 mg/dL (ref 0.50–1.10)
GFR calc Af Amer: >90 mL/min (ref >90)
GFR calc non Af Amer: >90 mL/min (ref >90)

## 2014-08-02 LAB — RPR

## 2014-08-02 SURGERY — Surgical Case
Anesthesia: Spinal

## 2014-08-02 MED ORDER — BUPIVACAINE HCL (PF) 0.25 % IJ SOLN
INTRAMUSCULAR | Status: AC
  Filled 2014-08-02: qty 10

## 2014-08-02 MED ORDER — DIPHENHYDRAMINE HCL 50 MG/ML IJ SOLN: 12.5000 mg | INTRAMUSCULAR | Status: DC | PRN

## 2014-08-02 MED ORDER — ONDANSETRON HCL 4 MG/2ML IJ SOLN
INTRAMUSCULAR | Status: DC | PRN
Start: 2014-08-02 — End: 2014-08-02
  Administered 2014-08-02: 4 mg via INTRAVENOUS

## 2014-08-02 MED ORDER — OXYTOCIN 10 UNIT/ML IJ SOLN
INTRAMUSCULAR | Status: AC
  Filled 2014-08-02: qty 4

## 2014-08-02 MED ORDER — PHENYLEPHRINE 40 MCG/ML (10ML) SYRINGE FOR IV PUSH (FOR BLOOD PRESSURE SUPPORT)
PREFILLED_SYRINGE | INTRAVENOUS | Status: AC
  Filled 2014-08-02: qty 5

## 2014-08-02 MED ORDER — DIPHENHYDRAMINE HCL 50 MG/ML IJ SOLN: 25.0000 mg | INTRAMUSCULAR | Status: DC | PRN

## 2014-08-02 MED ORDER — MEPERIDINE HCL 25 MG/ML IJ SOLN: 6.2500 mg | INTRAMUSCULAR | Status: DC | PRN

## 2014-08-02 MED ORDER — LACTATED RINGERS IV SOLN
INTRAVENOUS | Status: DC
Start: 2014-08-02 — End: 2014-08-05
  Administered 2014-08-03: 02:00:00 via INTRAVENOUS

## 2014-08-02 MED ORDER — SIMETHICONE 80 MG PO CHEW
80.0000 mg | CHEWABLE_TABLET | ORAL | Status: DC
  Administered 2014-08-02 – 2014-08-04 (×3): 80 mg via ORAL
  Filled 2014-08-02 (×3): qty 1

## 2014-08-02 MED ORDER — LACTATED RINGERS IV SOLN
INTRAVENOUS | Status: DC | PRN
  Administered 2014-08-02: 18:00:00 via INTRAVENOUS

## 2014-08-02 MED ORDER — METHYLERGONOVINE MALEATE 0.2 MG PO TABS: 0.2000 mg | ORAL_TABLET | ORAL | Status: DC | PRN

## 2014-08-02 MED ORDER — SENNOSIDES-DOCUSATE SODIUM 8.6-50 MG PO TABS
2.0000 | ORAL_TABLET | ORAL | Status: DC
  Administered 2014-08-02 – 2014-08-04 (×3): 2 via ORAL
  Filled 2014-08-02 (×3): qty 2

## 2014-08-02 MED ORDER — FENTANYL CITRATE 0.05 MG/ML IJ SOLN
INTRAMUSCULAR | Status: AC
  Filled 2014-08-02: qty 2

## 2014-08-02 MED ORDER — SIMETHICONE 80 MG PO CHEW: 80.0000 mg | CHEWABLE_TABLET | ORAL | Status: DC | PRN

## 2014-08-02 MED ORDER — EPHEDRINE 5 MG/ML INJ
INTRAVENOUS | Status: AC
  Filled 2014-08-02: qty 10

## 2014-08-02 MED ORDER — MEASLES, MUMPS & RUBELLA VAC ~~LOC~~ INJ
0.5000 mL | INJECTION | Freq: Once | SUBCUTANEOUS | Status: DC
  Filled 2014-08-02: qty 0.5

## 2014-08-02 MED ORDER — ONDANSETRON HCL 4 MG/2ML IJ SOLN
INTRAMUSCULAR | Status: AC
  Filled 2014-08-02: qty 2

## 2014-08-02 MED ORDER — CEFAZOLIN SODIUM-DEXTROSE 2-3 GM-% IV SOLR
INTRAVENOUS | Status: AC
  Filled 2014-08-02: qty 50

## 2014-08-02 MED ORDER — METHYLERGONOVINE MALEATE 0.2 MG/ML IJ SOLN: 0.2000 mg | INTRAMUSCULAR | Status: DC | PRN

## 2014-08-02 MED ORDER — DIPHENHYDRAMINE HCL 25 MG PO CAPS: 25.0000 mg | ORAL_CAPSULE | Freq: Four times a day (QID) | ORAL | Status: DC | PRN

## 2014-08-02 MED ORDER — NALBUPHINE HCL 10 MG/ML IJ SOLN: 5.0000 mg | INTRAMUSCULAR | Status: DC | PRN

## 2014-08-02 MED ORDER — IBUPROFEN 600 MG PO TABS
600.0000 mg | ORAL_TABLET | Freq: Four times a day (QID) | ORAL | Status: DC
  Administered 2014-08-02 – 2014-08-05 (×11): 600 mg via ORAL
  Filled 2014-08-02 (×11): qty 1

## 2014-08-02 MED ORDER — MENTHOL 3 MG MT LOZG: 1.0000 | LOZENGE | OROMUCOSAL | Status: DC | PRN

## 2014-08-02 MED ORDER — BUPIVACAINE IN DEXTROSE 0.75-8.25 % IT SOLN
INTRATHECAL | Status: DC | PRN
  Administered 2014-08-02: 1.6 mL via INTRATHECAL

## 2014-08-02 MED ORDER — SCOPOLAMINE 1 MG/3DAYS TD PT72
1.0000 | MEDICATED_PATCH | Freq: Once | TRANSDERMAL | Status: DC
  Administered 2014-08-02: 1.5 mg via TRANSDERMAL

## 2014-08-02 MED ORDER — FERROUS SULFATE 325 (65 FE) MG PO TABS
325.0000 mg | ORAL_TABLET | Freq: Two times a day (BID) | ORAL | Status: DC
  Administered 2014-08-03 – 2014-08-05 (×5): 325 mg via ORAL
  Filled 2014-08-02 (×5): qty 1

## 2014-08-02 MED ORDER — OXYTOCIN 40 UNITS IN LACTATED RINGERS INFUSION - SIMPLE MED: 62.5000 mL/h | INTRAVENOUS | Status: AC

## 2014-08-02 MED ORDER — PHENYLEPHRINE 8 MG IN D5W 100 ML (0.08MG/ML) PREMIX OPTIME
INJECTION | INTRAVENOUS | Status: DC | PRN
  Administered 2014-08-02: 60 ug/min via INTRAVENOUS

## 2014-08-02 MED ORDER — MORPHINE SULFATE 0.5 MG/ML IJ SOLN
INTRAMUSCULAR | Status: AC
  Filled 2014-08-02: qty 10

## 2014-08-02 MED ORDER — SCOPOLAMINE 1 MG/3DAYS TD PT72
MEDICATED_PATCH | TRANSDERMAL | Status: AC
  Filled 2014-08-02: qty 1

## 2014-08-02 MED ORDER — WITCH HAZEL-GLYCERIN EX PADS: 1.0000 "application " | MEDICATED_PAD | CUTANEOUS | Status: DC | PRN

## 2014-08-02 MED ORDER — KETOROLAC TROMETHAMINE 30 MG/ML IJ SOLN: 30.0000 mg | Freq: Four times a day (QID) | INTRAMUSCULAR | Status: AC | PRN

## 2014-08-02 MED ORDER — DIBUCAINE 1 % RE OINT: 1.0000 "application " | TOPICAL_OINTMENT | RECTAL | Status: DC | PRN

## 2014-08-02 MED ORDER — SIMETHICONE 80 MG PO CHEW
80.0000 mg | CHEWABLE_TABLET | Freq: Three times a day (TID) | ORAL | Status: DC
  Administered 2014-08-03 – 2014-08-05 (×7): 80 mg via ORAL
  Filled 2014-08-02 (×7): qty 1

## 2014-08-02 MED ORDER — ZOLPIDEM TARTRATE 5 MG PO TABS: 5.0000 mg | ORAL_TABLET | Freq: Every evening | ORAL | Status: DC | PRN

## 2014-08-02 MED ORDER — FENTANYL CITRATE 0.05 MG/ML IJ SOLN
INTRAMUSCULAR | Status: DC | PRN
  Administered 2014-08-02: 10 ug via INTRATHECAL

## 2014-08-02 MED ORDER — MORPHINE SULFATE (PF) 0.5 MG/ML IJ SOLN
INTRAMUSCULAR | Status: DC | PRN
  Administered 2014-08-02: .2 mg via INTRATHECAL

## 2014-08-02 MED ORDER — HEPARIN SODIUM (PORCINE) 5000 UNIT/ML IJ SOLN
5000.0000 [IU] | Freq: Three times a day (TID) | INTRAMUSCULAR | Status: DC
  Administered 2014-08-03 – 2014-08-05 (×7): 5000 [IU] via SUBCUTANEOUS
  Filled 2014-08-02 (×11): qty 1

## 2014-08-02 MED ORDER — PHENYLEPHRINE HCL 10 MG/ML IJ SOLN
INTRAMUSCULAR | Status: AC
  Filled 2014-08-02: qty 1

## 2014-08-02 MED ORDER — KETOROLAC TROMETHAMINE 30 MG/ML IJ SOLN
15.0000 mg | Freq: Once | INTRAMUSCULAR | Status: DC | PRN
Start: 2014-08-02 — End: 2014-08-02

## 2014-08-02 MED ORDER — SODIUM CHLORIDE 0.9 % IJ SOLN: 3.0000 mL | INTRAMUSCULAR | Status: DC | PRN

## 2014-08-02 MED ORDER — IBUPROFEN 600 MG PO TABS: 600.0000 mg | ORAL_TABLET | Freq: Four times a day (QID) | ORAL | Status: DC

## 2014-08-02 MED ORDER — LACTATED RINGERS IV SOLN
INTRAVENOUS | Status: DC | PRN
  Administered 2014-08-02 (×3): via INTRAVENOUS

## 2014-08-02 MED ORDER — CEFAZOLIN SODIUM-DEXTROSE 2-3 GM-% IV SOLR
INTRAVENOUS | Status: DC | PRN
  Administered 2014-08-02: 2 g via INTRAVENOUS

## 2014-08-02 MED ORDER — DIPHENHYDRAMINE HCL 25 MG PO CAPS: 25.0000 mg | ORAL_CAPSULE | ORAL | Status: DC | PRN

## 2014-08-02 MED ORDER — HYDROMORPHONE HCL PF 1 MG/ML IJ SOLN: 0.2500 mg | INTRAMUSCULAR | Status: DC | PRN

## 2014-08-02 MED ORDER — PRENATAL MULTIVITAMIN CH
1.0000 | ORAL_TABLET | Freq: Every day | ORAL | Status: DC
  Administered 2014-08-03 – 2014-08-05 (×3): 1 via ORAL
  Filled 2014-08-02 (×3): qty 1

## 2014-08-02 MED ORDER — NALOXONE HCL 0.4 MG/ML IJ SOLN: 0.4000 mg | INTRAMUSCULAR | Status: DC | PRN

## 2014-08-02 MED ORDER — OXYCODONE-ACETAMINOPHEN 5-325 MG PO TABS
1.0000 | ORAL_TABLET | ORAL | Status: DC | PRN
  Administered 2014-08-04: 1 via ORAL
  Filled 2014-08-02: qty 1

## 2014-08-02 MED ORDER — PROMETHAZINE HCL 25 MG/ML IJ SOLN: 6.2500 mg | INTRAMUSCULAR | Status: DC | PRN

## 2014-08-02 MED ORDER — FENTANYL 2.5 MCG/ML BUPIVACAINE 1/10 % EPIDURAL INFUSION (WH - ANES): INTRAMUSCULAR | Status: DC | PRN

## 2014-08-02 MED ORDER — URSODIOL 300 MG PO CAPS
300.0000 mg | ORAL_CAPSULE | Freq: Two times a day (BID) | ORAL | Status: DC
Start: 2014-08-02 — End: 2014-08-03
  Administered 2014-08-02: 300 mg via ORAL
  Filled 2014-08-02 (×4): qty 1

## 2014-08-02 MED ORDER — ZOLPIDEM TARTRATE 5 MG PO TABS
5.0000 mg | ORAL_TABLET | Freq: Every evening | ORAL | Status: DC | PRN
  Administered 2014-08-02: 5 mg via ORAL
  Filled 2014-08-02: qty 1

## 2014-08-02 MED ORDER — LANOLIN HYDROUS EX OINT: 1.0000 "application " | TOPICAL_OINTMENT | CUTANEOUS | Status: DC | PRN

## 2014-08-02 MED ORDER — OXYCODONE-ACETAMINOPHEN 5-325 MG PO TABS
2.0000 | ORAL_TABLET | ORAL | Status: DC | PRN
  Administered 2014-08-03 – 2014-08-04 (×2): 2 via ORAL
  Filled 2014-08-02 (×2): qty 2

## 2014-08-02 MED ORDER — KETOROLAC TROMETHAMINE 30 MG/ML IJ SOLN
INTRAMUSCULAR | Status: AC
  Administered 2014-08-02: 30 mg via INTRAMUSCULAR
  Filled 2014-08-02: qty 1

## 2014-08-02 MED ORDER — ONDANSETRON HCL 4 MG/2ML IJ SOLN: 4.0000 mg | Freq: Three times a day (TID) | INTRAMUSCULAR | Status: DC | PRN

## 2014-08-02 MED ORDER — METOCLOPRAMIDE HCL 5 MG/ML IJ SOLN: 10.0000 mg | Freq: Three times a day (TID) | INTRAMUSCULAR | Status: DC | PRN

## 2014-08-02 MED ORDER — KETOROLAC TROMETHAMINE 30 MG/ML IJ SOLN
30.0000 mg | Freq: Four times a day (QID) | INTRAMUSCULAR | Status: AC | PRN
  Administered 2014-08-02: 30 mg via INTRAMUSCULAR

## 2014-08-02 MED ORDER — ONDANSETRON HCL 4 MG PO TABS: 4.0000 mg | ORAL_TABLET | ORAL | Status: DC | PRN

## 2014-08-02 MED ORDER — CEFAZOLIN SODIUM-DEXTROSE 2-3 GM-% IV SOLR
2.0000 g | Freq: Once | INTRAVENOUS | Status: DC
  Filled 2014-08-02: qty 50

## 2014-08-02 MED ORDER — TETANUS-DIPHTH-ACELL PERTUSSIS 5-2.5-18.5 LF-MCG/0.5 IM SUSP: 0.5000 mL | Freq: Once | INTRAMUSCULAR | Status: DC

## 2014-08-02 MED ORDER — BUPIVACAINE HCL (PF) 0.25 % IJ SOLN
INTRAMUSCULAR | Status: DC | PRN
  Administered 2014-08-02: 20 mL

## 2014-08-02 MED ORDER — PHENYLEPHRINE 8 MG IN D5W 100 ML (0.08MG/ML) PREMIX OPTIME
INJECTION | INTRAVENOUS | Status: AC
  Filled 2014-08-02: qty 100

## 2014-08-02 MED ORDER — OXYTOCIN 40 UNITS IN LACTATED RINGERS INFUSION - SIMPLE MED
INTRAVENOUS | Status: DC | PRN
  Administered 2014-08-02: 40 [IU] via INTRAVENOUS

## 2014-08-02 MED ORDER — PHENYLEPHRINE HCL 10 MG/ML IJ SOLN
INTRAMUSCULAR | Status: DC | PRN
  Administered 2014-08-02: 80 ug via INTRAVENOUS

## 2014-08-02 MED ORDER — NALOXONE HCL 1 MG/ML IJ SOLN
1.0000 ug/kg/h | INTRAVENOUS | Status: DC | PRN
  Filled 2014-08-02: qty 2

## 2014-08-02 MED ORDER — IBUPROFEN 600 MG PO TABS: 600.0000 mg | ORAL_TABLET | Freq: Four times a day (QID) | ORAL | Status: DC | PRN

## 2014-08-02 MED ORDER — ONDANSETRON HCL 4 MG/2ML IJ SOLN: 4.0000 mg | INTRAMUSCULAR | Status: DC | PRN

## 2014-08-02 SURGICAL SUPPLY — 42 items
APL SKNCLS STERI-STRIP NONHPOA (GAUZE/BANDAGES/DRESSINGS) ×1
BENZOIN TINCTURE PRP APPL 2/3 (GAUZE/BANDAGES/DRESSINGS) ×2 IMPLANT
BLADE SURG 10 STRL SS (BLADE) ×4 IMPLANT
BOOTIES KNEE HIGH SLOAN (MISCELLANEOUS) ×4 IMPLANT
CLAMP CORD UMBIL (MISCELLANEOUS) IMPLANT
CLOTH BEACON ORANGE TIMEOUT ST (SAFETY) ×2 IMPLANT
DRAIN JACKSON PRT FLT 10 (DRAIN) ×1 IMPLANT
DRAPE LG THREE QUARTER DISP (DRAPES) IMPLANT
DRSG OPSITE POSTOP 4X10 (GAUZE/BANDAGES/DRESSINGS) ×2 IMPLANT
DRSG PAD ABDOMINAL 8X10 ST (GAUZE/BANDAGES/DRESSINGS) ×1 IMPLANT
DURAPREP 26ML APPLICATOR (WOUND CARE) ×2 IMPLANT
ELECT REM PT RETURN 9FT ADLT (ELECTROSURGICAL) ×2
ELECTRODE REM PT RTRN 9FT ADLT (ELECTROSURGICAL) ×1 IMPLANT
EVACUATOR SILICONE 100CC (DRAIN) IMPLANT
EXTRACTOR VACUUM M CUP 4 TUBE (SUCTIONS) IMPLANT
GLOVE BIOGEL PI IND STRL 7.0 (GLOVE) ×1 IMPLANT
GLOVE BIOGEL PI INDICATOR 7.0 (GLOVE) ×1
GLOVE ECLIPSE 6.5 STRL STRAW (GLOVE) ×2 IMPLANT
GOWN STRL REUS W/TWL LRG LVL3 (GOWN DISPOSABLE) ×4 IMPLANT
KIT ABG SYR 3ML LUER SLIP (SYRINGE) IMPLANT
NDL HYPO 25X5/8 SAFETYGLIDE (NEEDLE) IMPLANT
NEEDLE HYPO 22GX1.5 SAFETY (NEEDLE) ×2 IMPLANT
NEEDLE HYPO 25X5/8 SAFETYGLIDE (NEEDLE) IMPLANT
NS IRRIG 1000ML POUR BTL (IV SOLUTION) ×4 IMPLANT
PACK C SECTION WH (CUSTOM PROCEDURE TRAY) ×2 IMPLANT
PAD OB MATERNITY 4.3X12.25 (PERSONAL CARE ITEMS) ×2 IMPLANT
RETRACTOR WOUND ALXS 34CM XLRG (MISCELLANEOUS) IMPLANT
RTRCTR C-SECT PINK 25CM LRG (MISCELLANEOUS) ×2 IMPLANT
RTRCTR WOUND ALEXIS 34CM XLRG (MISCELLANEOUS) ×2
STRIP CLOSURE SKIN 1/2X4 (GAUZE/BANDAGES/DRESSINGS) ×2 IMPLANT
SUT CHROMIC GUT AB #0 18 (SUTURE) IMPLANT
SUT MNCRL AB 3-0 PS2 27 (SUTURE) ×2 IMPLANT
SUT PLAIN 2 0 XLH (SUTURE) ×2 IMPLANT
SUT SILK 2 0 FS (SUTURE) ×1 IMPLANT
SUT SILK 2 0 FSL 18 (SUTURE) IMPLANT
SUT VIC AB 0 CTX 36 (SUTURE) ×4
SUT VIC AB 0 CTX36XBRD ANBCTRL (SUTURE) ×2 IMPLANT
SUT VIC AB 1 CT1 36 (SUTURE) ×4 IMPLANT
SYRINGE 20CC LL (MISCELLANEOUS) ×2 IMPLANT
TOWEL OR 17X24 6PK STRL BLUE (TOWEL DISPOSABLE) ×2 IMPLANT
TRAY FOLEY CATH 14FR (SET/KITS/TRAYS/PACK) ×2 IMPLANT
WATER STERILE IRR 1000ML POUR (IV SOLUTION) ×2 IMPLANT

## 2014-08-02 NOTE — Anesthesia Procedure Notes (Signed)
Spinal  Patient location during procedure: OR Start time: 08/02/2014 5:03 PM End time: 08/02/2014 5:06 PM Staffing Anesthesiologist: Leilani Able Performed by: anesthesiologist  Preanesthetic Checklist Completed: patient identified, surgical consent, pre-op evaluation, timeout performed, IV checked, risks and benefits discussed and monitors and equipment checked Spinal Block Patient position: sitting Prep: site prepped and draped and DuraPrep Patient monitoring: heart rate, cardiac monitor, continuous pulse ox and blood pressure Approach: midline Location: L3-4 Injection technique: single-shot Needle Needle type: Pencan  Needle gauge: 24 G Needle length: 9 cm Needle insertion depth: 5 cm Assessment Sensory level: T4

## 2014-08-02 NOTE — Transfer of Care (Signed)
Immediate Anesthesia Transfer of Care Note  Patient: Tanya Dorsey  Procedure(s) Performed: Procedure(s): CESAREAN SECTION (N/A)  Patient Location: PACU  Anesthesia Type:Spinal  Level of Consciousness: awake  Airway & Oxygen Therapy: Patient Spontanous Breathing  Post-op Assessment: Report given to PACU RN  Post vital signs: Reviewed and stable  Complications: No apparent anesthesia complications

## 2014-08-02 NOTE — Progress Notes (Signed)
Clinical findings reviewed:   Has completed Cytotec x 4 with no improvement on cervical exam Bedside ultrasound with EFW 4534 g ( 9 lbs 15 oz) Class A2 diabetes Discussed with patient and husband recommendation to proceed with cesarean birth due to increased risk of shoulder dystocia. Shoulder dystocia reviewed as an obstetrical emergency that is unpredictable and requires a series of maneuvers that could lead to neonatal complications such as clavicle fracture, Erb's palsy and fetal death.  Cesarean section was also reviewed with pt with R&B including but not limited to:  bleeding, infection, injury to other organs. Low transverse approach planned which will allow vaginal delivery with future pregnancies. Should a vertical incision or inverted T be needed, patient is aware that repeat cesarean sections would be recommended in the future. Expected hospital stay and recovery also discussed.  All questions were answered and patient declines cesarean birth and desires to proceed with Induction of labor. Patient and husband are informed that once they are in active labor, we will need to follow a strict labor curve and that under no circumstances will we offer delivery assistance with forceps / vacuum.  Both voiced understanding/

## 2014-08-02 NOTE — Progress Notes (Signed)
Labor Progress  Subjective: Pt disappointed that she can't have a waterbirth as planned.  FOB at the bedside  Objective: BP 143/89  Pulse 102  Temp(Src) 98.5 F (36.9 C) (Oral)  Resp 18  Ht  (1.626 m)  Wt 126.1 kg (278 lb)  BMI 47.70 kg/m2  LMP 11/14/2013     FHT: 150, + accel, moderate variability, no decel CTX:  occassiona; Uterus gravid, soft non tender SVE:  Dilation: Fingertip Effacement (%): Thick Station: -3 Exam by:: V. Boomer Winders   Assessment:  IUP at 37.2 weeks NICHD: Category 1 Membranes:  intact Labor progress: IOL GBS: negative  Plan: Continue labor plan Continuous monitoring Rest Ambulate Frequent position changes to facilitate fetal descent  Will reassess with cervical exam at 1000 or earlier if necessary    Tanya Dorsey, CNM, MSN 08/02/2014. 3:26 PM

## 2014-08-02 NOTE — Progress Notes (Signed)
  Subjective: Unable to sleep. Not feeling ctxs, but very active fetus. Denies VB.  On admission, stated was planning a WB. Has doula on stand-by.  Objective: BP 130/76  Pulse 98  Temp(Src) 98.2 F (36.8 C) (Oral)  Resp 18  Ht  (1.626 m)  Wt 278 lb (126.1 kg)  BMI 47.70 kg/m2  LMP 11/14/2013     FHT: BL FHR 150 w/ mod variability, abundant accels, no decels UC: Irregular, uterine irritability SVE:   Dilation: Closed Effacement (%): Thick Station: Ballotable Exam by:: Caryl Never CNM Cytotec #2 placed w/o difficulty CBG 124  Assessment:  IUP at 37.2 wks IOL for ICP A2GDM GBS neg  Plan: Reviewed exclusionary criteria for WB and hydrotherapy, i.e. A2GDM and ICP. Pt. Verbalized understanding. Continue current plan. Re-evaluate in 4 hrs, sooner if needed. Ambien.  Sherre Scarlet CNM 08/02/2014, 12:50 AM

## 2014-08-02 NOTE — Anesthesia Postprocedure Evaluation (Signed)
Anesthesia Post Note  Patient: Tanya Dorsey  Procedure(s) Performed: Procedure(s) (LRB): CESAREAN SECTION (N/A)  Anesthesia type: Spinal  Patient location: PACU  Post pain: Pain level controlled  Post assessment: Post-op Vital signs reviewed  Last Vitals:  Filed Vitals:   08/02/14 1600  BP: 118/79  Pulse: 99  Temp: 36.7 C  Resp: 18    Post vital signs: Reviewed  Level of consciousness: awake  Complications: No apparent anesthesia complications

## 2014-08-02 NOTE — Progress Notes (Signed)
Labor Progress  Subjective: Benefits and risk of proceeding with a vaginal delivery of a macrosomic infant was reviewed.  Q&A on the CS process and recovery.  Pt very teary eyed.  Pt agreed to proceed with CS  Objective: BP 143/89  Pulse 102  Temp(Src) 98.5 F (36.9 C) (Oral)  Resp 18  Ht  (1.626 m)  Wt 126.1 kg (278 lb)  BMI 47.70 kg/m2  LMP 11/14/2013     FHT: 160 CTX:  irregular Uterus gravid, soft non tender SVE:  Dilation: Fingertip Effacement (%): Thick Station: -3 Exam by:: V. Mithcell Schumpert cytotec x4  Assessment:  IUP at 37.2 weeks NICHD: Category 1 Membranes:  intact Labor progress: early GBS: negative Last ate at 0800  Plan: Proceed with primary CS NPO Dr Estanislado Pandy informed  Tanya Dorsey, CNM, MSN 08/02/2014. 2:18 PM

## 2014-08-02 NOTE — Progress Notes (Signed)
Labor Progress  Subjective: Comfortable.  Denies pain, vb or lof w+fm  Objective: BP 143/89  Pulse 102  Temp(Src) 98.5 F (36.9 C) (Oral)  Resp 18  Ht  (1.626 m)  Wt 126.1 kg (278 lb)  BMI 47.70 kg/m2  LMP 11/14/2013     FHT: 155, moderate variability, + accel, no decel CTX:  irregular, every 2-6 minutes Uterus gravid, soft non tender SVE:  Dilation: Fingertip Effacement (%): Thick Station: -3 Exam by:: V. Ilissa Rosner  Assessment:  IUP at 37.2 weeks NICHD: Category Membranes:  intact Labor progress: IOL cytotec x 3 GBS: neg  Plan: Continue labor plan Continuous monitoring Frequent position changes to facilitate fetal descent  cytotec x4 if possible per protocol to be placed by the nurse Will reassess with cervical exam at 1400 or earlier if necessary   Tanya Dorsey, CNM, MSN 08/02/2014. 3:30 PM

## 2014-08-02 NOTE — Progress Notes (Signed)
Addendum  [place] report EFW 9lb 15oz (4513g) and AFI 13.1

## 2014-08-02 NOTE — Progress Notes (Signed)
Addendum Pt informed we will proceed with the CS at 1700 Pt understand she must remain NPO

## 2014-08-02 NOTE — Op Note (Signed)
Preoperative diagnosis: Intrauterine pregnancy at 37 weeks and 1 day with cholestasis of pregnancy, gestational diabetes and suspected macrosomia  Post operative diagnosis: Same  Anesthesia: Spinal  Anesthesiologist: Dr. Arby Barrette  Procedure: Primary low transverse cesarean section  Surgeon: Dr. Dois Davenport Maddon Horton  Assistant: Venus Standard CNM  Estimated blood loss: 900 cc  Procedure:  After being informed of the planned procedure and possible complications including bleeding, infection, injury to other organs, informed consent is obtained. The patient is taken to OR #1 and given spinal anesthesia without complication. She is placed in the dorsal decubitus position with the pelvis tilted to the left. She is then prepped and draped in a sterile fashion. A Foley catheter is inserted in her bladder.  After assessing adequate level of anesthesia, we infiltrate the suprapubic area with 20 cc of Marcaine 0.25 and perform a Pfannenstiel incision which is brought down sharply to the fascia. The fascia is entered in a low transverse fashion. Linea alba is dissected. Peritoneum is entered in a midline fashion. An Alexis retractor is easily positioned.   The myometrium is then entered in a low transverse fashion, 2 cm above the vesico-uterine junction ; first with knife and then extended bluntly. Amniotic fluid is clear. We assist the birth of a female  infant in vertex presentation. Mouth and nose are suctioned. The baby is delivered. The cord is clamped and sectioned. The baby is given to the neonatologist present in the room.  10 cc of blood is drawn from the umbilical vein.The placenta is allowed to deliver spontaneously. It is complete and the cord has 3 vessels. Uterine revision is negative.  We proceed with closure of the myometrium in 2 layers: First with a running locked suture of 0 Vicryl, then with a Lembert suture of 0 Vicryl imbricating the first one. Hemostasis is completed with cauterization  on peritoneal edges.  Both paracolic gutters are cleaned. Both tubes and ovaries are assessed and normal. The pelvis is profusely irrigated with warm saline to confirm a satisfactory hemostasis.  Retractors and sponges are removed. Under fascia hemostasis is completed with cauterization. The fascia is then closed with 2 running sutures of 0 Vicryl meeting midline. The wound is irrigated with warm saline and hemostasis is completed with cauterization. A #10 Jp drain is placed in the incision ans sutured to the skin with 2-0 silk. The subcuticular space is closed with interrupted sutures of 2-0 Plain.The skin is closed with a subcuticular suture of 3-0 Monocryl and Steri-Strips.  Instrument and sponge count is complete x2. Estimated blood loss is 900 cc.  The procedure is well tolerated by the patient who is taken to recovery room in a well and stable condition.  female baby named Autumn was born at 17:38 and received an Apgar of 8  at 1 minute and 9 at 5 minutes.    Specimen: Placenta sent to L & D   Adriana Lina A MD 9/9/20155:04 PM

## 2014-08-02 NOTE — Anesthesia Preprocedure Evaluation (Signed)
Anesthesia Evaluation  Patient identified by MRN, date of birth, ID band Patient awake    Reviewed: Allergy & Precautions, H&P , NPO status , Patient's Chart, lab work & pertinent test results  Airway Mallampati: III TM Distance: >3 FB Neck ROM: full    Dental no notable dental hx.    Pulmonary neg pulmonary ROS,    Pulmonary exam normal       Cardiovascular hypertension, Rhythm:regular     Neuro/Psych negative neurological ROS     GI/Hepatic negative GI ROS, Neg liver ROS,   Endo/Other  diabetes, Gestational, Oral Hypoglycemic AgentsMorbid obesity  Renal/GU negative Renal ROS     Musculoskeletal   Abdominal (+) + obese,   Peds  Hematology negative hematology ROS (+)   Anesthesia Other Findings   Reproductive/Obstetrics (+) Pregnancy                           Anesthesia Physical Anesthesia Plan  ASA: III  Anesthesia Plan: Spinal   Post-op Pain Management:    Induction:   Airway Management Planned:   Additional Equipment:   Intra-op Plan:   Post-operative Plan:   Informed Consent: I have reviewed the patients History and Physical, chart, labs and discussed the procedure including the risks, benefits and alternatives for the proposed anesthesia with the patient or authorized representative who has indicated his/her understanding and acceptance.     Plan Discussed with: CRNA and Surgeon  Anesthesia Plan Comments:         Anesthesia Quick Evaluation

## 2014-08-02 NOTE — Progress Notes (Addendum)
  Subjective: Sleeping, easily aroused. Spouse at bedside. Reports no itching since admission.  Objective: BP 112/78  Pulse 102  Temp(Src) 98.5 F (36.9 C) (Oral)  Resp 18  Ht  (1.626 m)  Wt 278 lb (126.1 kg)  BMI 47.70 kg/m2  LMP 11/14/2013     FHT: BL 140 w/ mod variability, +accels, no decels UC:   irregular SVE:   Dilation: Fingertip Effacement (%): Thick Station: Ballotable Exam by:: Auriel RN   3rd dose of Cytotec placed  CBGs 111, 124, 93  EFW at 35.4 wks = 3737 g or 8lbs 4oz  Assessment:  IOL for ICP at 37.2 wks A2GDM; stable sugars GBS neg Elevated BMI NICHD, Cat 1  Plan: Continue current plan Consult prn  Sherre Scarlet CNM 08/02/2014, 0500

## 2014-08-03 ENCOUNTER — Encounter (HOSPITAL_COMMUNITY): Payer: Self-pay | Admitting: Obstetrics and Gynecology

## 2014-08-03 LAB — CBC
HEMATOCRIT: 31.3 % — AB (ref 36.0–46.0)
Hemoglobin: 10.1 g/dL — ABNORMAL LOW (ref 12.0–15.0)
MCH: 26.2 pg (ref 26.0–34.0)
MCHC: 32.3 g/dL (ref 30.0–36.0)
MCV: 81.1 fL (ref 78.0–100.0)
Platelets: 235 10*3/uL (ref 150–400)
RBC: 3.86 MIL/uL — ABNORMAL LOW (ref 3.87–5.11)
RDW: 14.5 % (ref 11.5–15.5)
WBC: 9.4 10*3/uL (ref 4.0–10.5)

## 2014-08-03 LAB — GLUCOSE, CAPILLARY
Glucose-Capillary: 68 mg/dL — ABNORMAL LOW (ref 70–99)
Glucose-Capillary: 111 mg/dL — ABNORMAL HIGH (ref 70–99)
Glucose-Capillary: 65 mg/dL — ABNORMAL LOW (ref 70–99)

## 2014-08-03 NOTE — Lactation Note (Signed)
This note was copied from the chart of Tanya Halah Whiteside. Lactation Consultation Note     Initial consult with this mom of a NICU baby, now 2 hours old, and 37 3/7 weeks CGA, LGA, weighing over 9 pounds, in NICU with hypoglycemia, IGDM. I assisted mom with latching baby for the first time, in the nICU. Mom has lots of easily hand expressed colostrum, and flat nipples. The baby is used to a bottle, and would not latch without a nipple shield. i fitted mom for a 24 shield, and the baby latched well. Mom has very compressible breast tissue, and I was able to obtain a deep latch for mom and baby. I fed her 10 mls of colostrum into the shield, with a curved tip syringe, and she continued to intermittently suckle for about 10 minutes after this. Mom then supplemented  baby with formula, as per order. Mom and dad very receptive to teaching. I will do mor teaching from the NICU booklet at a later time. Baby left skin to skin with mom, and dad present also. Lactation services and availability of lactation o/p also reviewed with mom.   Patient Name: Tanya Dorsey ONGEX'B Date: 08/03/2014 Reason for consult: Follow-up assessment;NICU baby;Other (Comment) (early term, IGDM, LGA - weighin over 9 pounds, hypoglycemia)   Maternal Data Formula Feeding for Exclusion: Yes (baby in NICU) Does the patient have breastfeeding experience prior to this delivery?: Yes  Feeding Feeding Type: Breast Fed Nipple Type: Regular Length of feed: 15 min  LATCH Score/Interventions Latch: Repeated attempts needed to sustain latch, nipple held in mouth throughout feeding, stimulation needed to elicit sucking reflex. (baby latched with 24 nipple shiled) Intervention(s): Adjust position;Assist with latch;Breast compression  Audible Swallowing: A few with stimulation  Type of Nipple: Flat  Comfort (Breast/Nipple): Soft / non-tender     Hold (Positioning): Assistance needed to correctly position infant at breast and  maintain latch.  LATCH Score: 6  Lactation Tools Discussed/Used WIC Program: No Pump Review: Setup, frequency, and cleaning;Milk Storage;Other (comment) (hand expression taught, with good demonstration from mom, review of NICU booklet on EBM, premie setting) Initiated by:: bedside RN Date initiated:: 08/02/14   Consult Status Consult Status: Follow-up Date: 08/04/14 Follow-up type: In-patient    Alfred Levins 08/03/2014, 11:42 AM

## 2014-08-03 NOTE — Progress Notes (Signed)
Pt Fasting CBG checked at 0514 and pt was pumping at the time, blood sugar result of 68. Pt was given cranberry juice and graham crackers. CBG rechecked 15 minutes later and result of 65. Pt given repeat of apple juice, peanut butter and crackers. CBG checked once again 15 minutes later and blood sugar result of 111. Will continue to monitor pt.

## 2014-08-03 NOTE — Addendum Note (Signed)
Addendum created 08/03/14 0749 by Yolonda Kida, CRNA   Modules edited: Notes Section   Notes Section:  File: [id number]

## 2014-08-03 NOTE — Progress Notes (Signed)
Patient ID: Tanya Dorsey, female   DOB: 1986/10/12, 28 y.o.   MRN: [id number]  I saw patient while she was in NICU visiting her baby.  I agree with findings, assessment and plan as laid out by [facility] in her progress note.

## 2014-08-03 NOTE — Anesthesia Postprocedure Evaluation (Signed)
  Anesthesia Post-op Note  Patient: Tanya Dorsey  Procedure(s) Performed: Procedure(s): CESAREAN SECTION (N/A)  Patient Location: PACU and Women's Unit  Anesthesia Type:Spinal  Level of Consciousness: awake, alert , oriented and patient cooperative  Airway and Oxygen Therapy: Patient Spontanous Breathing  Post-op Pain: none  Post-op Assessment: Post-op Vital signs reviewed, Patient's Cardiovascular Status Stable, Respiratory Function Stable, Patent Airway, No signs of Nausea or vomiting, Pain level controlled, No headache, No backache, No residual numbness and No residual motor weakness  Post-op Vital Signs: Reviewed and stable  Last Vitals:  Filed Vitals:   08/03/14 0601  BP: 117/67  Pulse: 80  Temp: 37.1 C  Resp: 18    Complications: No apparent anesthesia complications

## 2014-08-03 NOTE — Progress Notes (Signed)
Subjective: Postpartum Day 1: Cesarean Delivery due to cholestasis of pregnancy, gestational diabetes and suspected macrosomia Patient up ad lib, reports no syncope or dizziness. Pt ambulating in room unassisted and without difficulty Denies severe pain, needing very little pain medication Feeding:  breastfeeding Contraceptive plan:  unsure  Objective: Vital signs in last 24 hours: Temp:  [98.1 F (36.7 C)-98.8 F (37.1 C)] 98.8 F (37.1 C) (09/10 0601) Pulse Rate:  [70-102] 80 (09/10 0601) Resp:  [14-23] 18 (09/10 0601) BP: (107-143)/(51-89) 117/67 mmHg (09/10 0601) SpO2:  [96 %-100 %] 98 % (09/10 0601)  Physical Exam:  General: alert and cooperative Lochia: appropriate Uterine Fundus: firm Abdomen:  + bowel sounds, non distended Incision: healing well  Pressure dressing CDI DVT Evaluation: No evidence of DVT seen on physical exam. Homan's sign: Negative   Recent Labs  08/01/14 2105 08/02/14 2155  HGB 11.6* 11.4*  HCT 35.1* 35.0*  WBC 11.6* 16.3*   Assessment: Status post Cesarean section day 1. Doing well postoperatively.  Honeycomb dressing in place, no significant drainage JP drain in place, 12 cc last 12 hours  Anemia - hemodynamicly stable.   Infant in NICU d/t low blood sugars  Plan: Continue current care. Breastfeeding and Lactation consult   Kerline Trahan, CNM, MSN 08/03/2014. 8:12 AM

## 2014-08-04 LAB — BIRTH TISSUE RECOVERY COLLECTION (PLACENTA DONATION)

## 2014-08-04 LAB — GLUCOSE, CAPILLARY: GLUCOSE-CAPILLARY: 139 mg/dL — AB (ref 70–99)

## 2014-08-04 MED ORDER — INFLUENZA VAC SPLIT QUAD 0.5 ML IM SUSY
0.5000 mL | PREFILLED_SYRINGE | INTRAMUSCULAR | Status: AC
  Administered 2014-08-05: 0.5 mL via INTRAMUSCULAR
  Filled 2014-08-04: qty 0.5

## 2014-08-04 NOTE — Lactation Note (Signed)
This note was copied from the chart of Tanya Dorsey. Lactation Consultation Note       Follow up consult with this mom and NICU baby, now 55 hours old, and 37 4/7 weeks CGA. I assisted mom with latching the baby in cross cradle hold. I t took a few tries to get her latched, due to flat nipples, but once latched, she suckled well for a good 15 minutes, mom denies pinching or discomfort, with good breast movement noted. On exam, I was able to express only tiny drops of colostrum today. Mom originally had lots of colostrum. I explained that this can be normal, and hopefully by tomorrow at this time, she should see an increase in her supply. I gave mom the paper work for a 2 week DEP rental, and explained to mom that she needed to call her insurance company to begin the process of getting a pump.   Patient Name: Tanya Dorsey [id number] Date: 08/04/2014 Reason for consult: Follow-up assessment;NICU baby   Maternal Data    Feeding Feeding Type: Breast Fed Length of feed: 15 min  LATCH Score/Interventions Latch: Repeated attempts needed to sustain latch, nipple held in mouth throughout feeding, stimulation needed to elicit sucking reflex. Intervention(s): Adjust position;Assist with latch;Breast massage;Breast compression  Audible Swallowing: None Intervention(s): Skin to skin;Hand expression  Type of Nipple: Flat (sosft, compressible tissue - lartched well without nipple shield) Intervention(s): Double electric pump  Comfort (Breast/Nipple): Filling, red/small blisters or bruises, mild/mod discomfort  Problem noted: Mild/Moderate discomfort;Cracked, bleeding, blisters, bruises Interventions  (Cracked/bleeding/bruising/blister): Expressed breast milk to nipple Interventions (Mild/moderate discomfort): Hand expression  Hold (Positioning): Assistance needed to correctly position infant at breast and maintain latch. Intervention(s): Breastfeeding basics reviewed;Support  Pillows;Position options;Skin to skin  LATCH Score: 4  Lactation Tools Discussed/Used     Consult Status Consult Status: Follow-up Date: 08/05/14 Follow-up type: In-patient    Alfred Levins 08/04/2014, 3:30 PM

## 2014-08-04 NOTE — Lactation Note (Signed)
This note was copied from the chart of Tanya Zully Frane. Lactation Consultation Note   Follow up consult with this mom of a NICU baby, now 85 hours old, and 37 4/7 weeks CGA. I rented mom a 2 week DEP. Mom will be discharged tomorrow, and will get to room inwith the baby tomorrow night, and if all goes well, baby will be discharged on Sunday.  Patient Name: Tanya Dorsey [id number] Date: 08/04/2014 Reason for consult: Follow-up assessment   Maternal Data    Feeding Feeding Type: Breast Fed Length of feed: 15 min  LATCH Score/Interventions Latch: Repeated attempts needed to sustain latch, nipple held in mouth throughout feeding, stimulation needed to elicit sucking reflex. Intervention(s): Adjust position;Assist with latch;Breast massage;Breast compression  Audible Swallowing: A few with stimulation Intervention(s): Skin to skin;Hand expression  Type of Nipple: Flat Intervention(s): Double electric pump  Comfort (Breast/Nipple): Filling, red/small blisters or bruises, mild/mod discomfort  Problem noted: Cracked, bleeding, blisters, bruises;Mild/Moderate discomfort Interventions  (Cracked/bleeding/bruising/blister): Expressed breast milk to nipple Interventions (Mild/moderate discomfort): Hand expression  Hold (Positioning): Assistance needed to correctly position infant at breast and maintain latch. Intervention(s): Breastfeeding basics reviewed;Support Pillows;Position options;Skin to skin  LATCH Score: 5  Lactation Tools Discussed/Used Pump Review: Setup, frequency, and cleaning   Consult Status Consult Status: PRN Date: 08/05/14 Follow-up type: In-patient (NICU)    Tanya Dorsey 08/04/2014, 5:27 PM

## 2014-08-04 NOTE — Progress Notes (Signed)
Subjective: Postpartum Day 2: Cesarean Delivery due to cholestasis of pregnancy, gestational diabetes and suspected macrosomia Patient up ad lib, reports no syncope or dizziness. Feeding:  breastfeeding Contraceptive plan:  unsure  Objective: Vital signs in last 24 hours: Temp:  [97.7 F (36.5 C)-98 F (36.7 C)] 97.7 F (36.5 C) (09/10 2153) Pulse Rate:  [80-90] 82 (09/10 2153) Resp:  [18] 18 (09/10 2153) BP: (108-123)/(50-75) 116/50 mmHg (09/10 2153) SpO2:  [100 %] 100 % (09/10 2153)  Physical Exam:  General: alert and cooperative Lochia: appropriate Uterine Fundus: firm Abdomen:  + bowel sounds, non distended Incision: healing well, no significant drainage  Honeycomb dressing CDI DVT Evaluation: No evidence of DVT seen on physical exam. Homan's sign: Negative   Recent Labs  08/01/14 2105 08/02/14 2155 08/03/14 0506  HGB 11.6* 11.4* 10.1*  HCT 35.1* 35.0* 31.3*  WBC 11.6* 16.3* 9.4    Assessment: Status post Cesarean section day 2. Doing well postoperatively.  Pressure dressing in place, no significant drainage JP drain in place,44.5cc last 24 hours Anemia - hemodynamicly stable.    Plan: Continue current care. Plan for discharge tomorrow, Breastfeeding and Lactation consult Remove outer dressing   Tanya Dorsey, CNM, MSN 08/04/2014. 9:33 AM

## 2014-08-05 LAB — TYPE AND SCREEN
ABO/RH(D): O NEG
Antibody Screen: POSITIVE
DAT, IgG: NEGATIVE
Unit division: 0
Unit division: 0

## 2014-08-05 LAB — GLUCOSE, CAPILLARY: GLUCOSE-CAPILLARY: 90 mg/dL (ref 70–99)

## 2014-08-05 MED ORDER — OXYCODONE-ACETAMINOPHEN 5-325 MG PO TABS: 1.0000 | ORAL_TABLET | ORAL | Status: DC | PRN

## 2014-08-05 MED ORDER — BREAST PUMP MISC: Status: DC

## 2014-08-05 MED ORDER — IBUPROFEN 600 MG PO TABS: 600.0000 mg | ORAL_TABLET | Freq: Four times a day (QID) | ORAL | Status: DC | PRN

## 2014-08-05 NOTE — Discharge Instructions (Signed)

## 2014-08-05 NOTE — Discharge Summary (Signed)
Cesarean Section Delivery Discharge Summary  Tanya Dorsey  DOB:    [birth year] MR[place] Morrical045 CSN:    [id number]  Date of admission:                  08/01/14  Date of discharge:                   08/05/14  Procedures this admission:  Primary LTCS due to LGA, magnesium sulfate for pre-eclampsia  Date of Delivery: 08/02/14  Newborn Data:  Live born female  Birth Weight: 9 lb 6.4 oz (4265 g) APGAR: 8, 9  Home with mother. Name: Autumn  History of Present Illness:  Ms. Tanya Dorsey is a 28 y.o. female, G1P1001, who presents at [redacted]w[redacted]d weeks gestation. The patient has been followed at the [facility] division of [facility].    Her pregnancy has been complicated by:  Patient Active Problem List   Diagnosis Date Noted  . Cesarean delivery delivered 08/02/2014  . Intrahepatic cholestasis of pregnancy 08/01/2014  . GDM, class A2 07/29/2014  . 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 Depression 07/29/2014  . ADD (attention deficit disorder) 07/29/2014  . Rh negative, maternal 07/29/2014  . Severe obesity (BMI >= 40) 07/29/2014  . Plantar fasciitis 07/29/2014  . Anxiety state, unspecified 07/29/2014    Hospital Course--Unscheduled Cesarean:  Admitted 08/01/14 for induction due to cholestasis of pregnancy and GDM, managed with Glyburide.  She had planned a waterbirth, but this was not an option due to her GDM with medication management.  Negative GBS.  Cervical ripening was initiated with Cytotech, with 3 doses placed.  She never had any changed in her cervix, which remained FT and thick, with vtx -3.  [place] was done, with EFW 9+15.  Due to GDM and LGA findings, as well as unfavorable cervix, she was consented for C/S.  Dr. Estanislado Pandy performed a primary LTCS under spinal anesthesia, with delivery of a viable female, with weight and Apgars as listed below. Infant was in good condition and remained at  the patient's bedside.  The patient was taken to recovery in good condition.  Patient planned to breastfeed.  Baby was taken to NICU due to hypoglycemia, and remained there for 4 days.  On post-op day 1, patient was doing well, tolerating a regular diet, with Hgb of 11.4.  Throughout her stay, her physical exam was WNL, her incision was CDI, and her vital signs remained stable.  She had a JP drain that was removed on day 3.  By post-op day 3, she was up ad lib, tolerating a regular diet, with good pain control with po med.  She was deemed to have received the full benefit of her hospital stay, and was discharged home in stable condition.  Contraceptive choice was undecided at the time of d/c. She was going to room-in with the baby after her own d/c, with anticipated d/c of baby the following day.  Feeding:  breast  Contraception:  Undecided at present.  Discharge hemoglobin:  Hemoglobin  Date Value Ref Range Status  08/03/2014 10.1* 12.0 - 15.0 g/dL Final  06/01/2955 21.3* 08.6 - 15.0 g/dL Final  03/30/8468 62.9* 52.8 - 15.0 g/dL Final  02/23/3243 01.0  27.2 - 16.2 g/dL Final     HCT  Date Value Ref Range Status  08/03/2014 31.3* 36.0 - 46.0 % Final  08/02/2014 35.0* 36.0 - 46.0 % Final  08/01/2014  35.1* 36.0 - 46.0 % Final     HCT, POC  Date Value Ref Range Status  03/24/2013 45.2  37.7 - 47.9 % Final     WBC  Date Value Ref Range Status  08/03/2014 9.4  4.0 - 10.5 K/uL Final  08/02/2014 16.3* 4.0 - 10.5 K/uL Final  08/01/2014 11.6* 4.0 - 10.5 K/uL Final  03/24/2013 6.4  4.6 - 10.2 K/uL Final    Discharge Physical Exam:   General: alert Lochia: appropriate Uterine Fundus: firm Abdomen:  + bowel sounds Incision: Honeycomb dressing CDI DVT Evaluation: No evidence of DVT seen on physical exam. Negative Homan's sign.  Intrapartum Procedures: cesarean: low cervical, transverse Postpartum Procedures: Rho(D) Ig Complications-Operative and Postpartum: none  Discharge Diagnoses: Term  Pregnancy-delivered and primary LTCS due to LGA, unfavorable cervix, cholestasis of pregnancy, gestational diabetes  Discharge Information:  Activity:           pelvic rest Diet:                routine Medications: Ibuprofen and Percocet Condition:      stable Instructions:  Discharge to: home  Follow-up Information   Follow up with [facility]. Schedule an appointment as soon as possible for a visit in 5 weeks. (Call for any questions or concerns.)    Specialty:  Obstetrics and Gynecology   Contact information:   [address] [place] [address] [phone]       Nigel Bridgeman [facility] 08/05/2014 9:37 AM

## 2014-08-06 ENCOUNTER — Emergency Department (HOSPITAL_COMMUNITY)
Admission: EM | Admit: 2014-08-06 | Discharge: 2014-08-06 | Disposition: A | Discharge status: Home / Self Care | Payer: 59 | Attending: Emergency Medicine | Admitting: Emergency Medicine

## 2014-08-06 ENCOUNTER — Encounter (HOSPITAL_COMMUNITY): Payer: Self-pay | Admitting: Emergency Medicine

## 2014-08-06 DIAGNOSIS — Z79899 Other long term (current) drug therapy: Secondary | ICD-10-CM | POA: Insufficient documentation

## 2014-08-06 DIAGNOSIS — Z8632 Personal history of gestational diabetes: Secondary | ICD-10-CM | POA: Diagnosis not present

## 2014-08-06 DIAGNOSIS — Z8659 Personal history of other mental and behavioral disorders: Secondary | ICD-10-CM | POA: Insufficient documentation

## 2014-08-06 DIAGNOSIS — H539 Unspecified visual disturbance: Secondary | ICD-10-CM | POA: Diagnosis not present

## 2014-08-06 DIAGNOSIS — O909 Complication of the puerperium, unspecified: Secondary | ICD-10-CM | POA: Diagnosis not present

## 2014-08-06 DIAGNOSIS — M7989 Other specified soft tissue disorders: Secondary | ICD-10-CM

## 2014-08-06 DIAGNOSIS — O26899 Other specified pregnancy related conditions, unspecified trimester: Secondary | ICD-10-CM | POA: Insufficient documentation

## 2014-08-06 DIAGNOSIS — O165 Unspecified maternal hypertension, complicating the puerperium: Secondary | ICD-10-CM | POA: Diagnosis present

## 2014-08-06 DIAGNOSIS — O1200 Gestational edema, unspecified trimester: Secondary | ICD-10-CM

## 2014-08-06 DIAGNOSIS — R000 Tachycardia, unspecified: Secondary | ICD-10-CM | POA: Diagnosis not present

## 2014-08-06 LAB — COMPREHENSIVE METABOLIC PANEL
ALT: 54 U/L — AB (ref 0–35)
AST: 39 U/L — AB (ref 0–37)
Albumin: 2.4 g/dL — ABNORMAL LOW (ref 3.5–5.2)
Alkaline Phosphatase: 109 U/L (ref 39–117)
Anion gap: 14 (ref 5–15)
BUN: 9 mg/dL (ref 6–23)
CALCIUM: 8.8 mg/dL (ref 8.4–10.5)
CO2: 20 meq/L (ref 19–32)
CREATININE: 0.61 mg/dL (ref 0.50–1.10)
Chloride: 105 mEq/L (ref 96–112)
GFR calc Af Amer: >90 mL/min (ref >90)
GFR calc non Af Amer: >90 mL/min (ref >90)
Glucose, Bld: 101 mg/dL — ABNORMAL HIGH (ref 70–99)
Potassium: 4.1 mEq/L (ref 3.7–5.3)
SODIUM: 139 meq/L (ref 137–147)
TOTAL PROTEIN: 6.2 g/dL (ref 6.0–8.3)
Total Bilirubin: <0.2 mg/dL — ABNORMAL LOW (ref 0.3–1.2)

## 2014-08-06 LAB — URINE MICROSCOPIC-ADD ON

## 2014-08-06 LAB — URINALYSIS, ROUTINE W REFLEX MICROSCOPIC
Bilirubin Urine: NEGATIVE
Glucose, UA: NEGATIVE mg/dL
KETONES UR: NEGATIVE mg/dL
LEUKOCYTES UA: NEGATIVE
NITRITE: NEGATIVE
Protein, ur: NEGATIVE mg/dL
SPECIFIC GRAVITY, URINE: 1.008 (ref 1.005–1.030)
Urobilinogen, UA: 0.2 mg/dL (ref 0.0–1.0)
pH: 6.5 (ref 5.0–8.0)

## 2014-08-06 LAB — CBC
HCT: 33 % — ABNORMAL LOW (ref 36.0–46.0)
Hemoglobin: 10.6 g/dL — ABNORMAL LOW (ref 12.0–15.0)
MCH: 26 pg (ref 26.0–34.0)
MCHC: 32.1 g/dL (ref 30.0–36.0)
MCV: 80.9 fL (ref 78.0–100.0)
PLATELETS: 367 10*3/uL (ref 150–400)
RBC: 4.08 MIL/uL (ref 3.87–5.11)
RDW: 14.9 % (ref 11.5–15.5)
WBC: 9 10*3/uL (ref 4.0–10.5)

## 2014-08-06 LAB — PROTIME-INR
INR: 0.87 (ref 0.00–1.49)
PROTHROMBIN TIME: 11.8 s (ref 11.6–15.2)

## 2014-08-06 NOTE — Progress Notes (Signed)
VASCULAR LAB PRELIMINARY  PRELIMINARY  PRELIMINARY  PRELIMINARY  Bilateral lower extremity venous Dopplers completed.    Preliminary report:  There is no DVT or SVT noted in the bilateral lower extremities.    Dennette Faulconer, RVT 08/06/2014, 7:01 PM

## 2014-08-06 NOTE — ED Notes (Signed)
MD at the bedside  

## 2014-08-06 NOTE — ED Notes (Signed)
Initially she had some visual problems also

## 2014-08-06 NOTE — Discharge Instructions (Signed)
Peripheral Edema °You have swelling in your legs (peripheral edema). This swelling is due to excess accumulation of salt and water in your body. Edema may be a sign of heart, kidney or liver disease, or a side effect of a medication. It may also be due to problems in the leg veins. Elevating your legs and using special support stockings may be very helpful, if the cause of the swelling is due to poor venous circulation. Avoid long periods of standing, whatever the cause. °Treatment of edema depends on identifying the cause. Chips, pretzels, pickles and other salty foods should be avoided. Restricting salt in your diet is almost always needed. Water pills (diuretics) are often used to remove the excess salt and water from your body via urine. These medicines prevent the kidney from reabsorbing sodium. This increases urine flow. °Diuretic treatment may also result in lowering of potassium levels in your body. Potassium supplements may be needed if you have to use diuretics daily. Daily weights can help you keep track of your progress in clearing your edema. You should call your caregiver for follow up care as recommended. °SEEK IMMEDIATE MEDICAL CARE IF:  °· You have increased swelling, pain, redness, or heat in your legs. °· You develop shortness of breath, especially when lying down. °· You develop chest or abdominal pain, weakness, or fainting. °· You have a fever. °Document Released: 12/18/2004 Document Revised: 02/02/2012 Document Reviewed: 11/28/2009 °ExitCare® Patient Information ©2015 [employer]. This information is not intended to replace advice given to you by your health care provider. Make sure you discuss any questions you have with your health care provider. ° °

## 2014-08-06 NOTE — ED Notes (Signed)
Patient returned from Vascular 

## 2014-08-06 NOTE — ED Notes (Signed)
The pt just had a c-section this past Wednesday.  Normal everything  Today she has high bp and she has lower extremity swelling rt leg more swollen than the lt.  No calf pain.  Swollen lt wrist area also.

## 2014-08-06 NOTE — ED Provider Notes (Signed)
CSN: [id number]     Arrival date & time 08/06/14  1614 History   First MD Initiated Contact with Patient 08/06/14 1724     Chief Complaint  Patient presents with  . Hypertension     (Consider location/radiation/quality/duration/timing/severity/associated sxs/prior Treatment) Patient is a 28 y.o. female presenting with hypertension and eye problem. The history is provided by the patient.  Hypertension This is a new problem. The current episode started 1 to 2 hours ago. The problem occurs constantly. The problem has been gradually improving. Pertinent negatives include no abdominal pain and no shortness of breath. Nothing aggravates the symptoms. Nothing relieves the symptoms.  Eye Problem Location:  Both Quality: Lightning bolt across her vision. Severity:  Moderate Onset quality:  Sudden Duration:  10 minutes Timing:  Constant Progression:  Resolved Chronicity:  New Context: not chemical exposure, not contact lens problem, not smoke exposure and not tanning booth use   Relieved by:  Nothing Worsened by:  Nothing tried Associated symptoms: no vomiting     Past Medical History  Diagnosis Date  . Depression   . Hypertension   . Gestational diabetes mellitus, antepartum   . Cholestasis of pregnancy    Past Surgical History  Procedure Laterality Date  . Cholecystectomy    . Wisdom tooth extraction    . Cesarean section N/A 08/02/2014    Procedure: CESAREAN SECTION;  Surgeon: Esmeralda Arthur, MD;  Location: [facility];  Service: Obstetrics;  Laterality: N/A;   Family History  Problem Relation Age of Onset  . Diabetes Mother   . Hypertension Mother   . Hypertension Father    History  Substance Use Topics  . Smoking status: Never Smoker   . Smokeless tobacco: Not on file  . Alcohol Use: No   OB History   Grav Para Term Preterm Abortions TAB SAB Ect Mult Living   0 0 0 0 0 0 1     Review of Systems  Constitutional: Negative for fever.  Respiratory: Negative for  cough and shortness of breath.   Gastrointestinal: Negative for vomiting and abdominal pain.  All other systems reviewed and are negative.     Allergies  Review of patient's allergies indicates no known allergies.  Home Medications   Prior to Admission medications   Medication Sig Start Date End Date Taking? Authorizing Provider  ibuprofen (ADVIL,MOTRIN) 600 MG tablet Take 1 tablet (600 mg total) by mouth every 6 (six) hours as needed for mild pain. 08/05/14   Nigel Bridgeman, CNM  Misc. Devices (BREAST PUMP) MISC Dispense 1 breast pump to patient. 08/05/14   Nigel Bridgeman, CNM  oxyCODONE-acetaminophen (PERCOCET/ROXICET) 5-325 MG per tablet Take 1 tablet by mouth every 4 (four) hours as needed (for pain scale less than 7). 08/05/14   Nigel Bridgeman, CNM  Prenatal Vit-Fe Fumarate-FA (PRENATAL MULTIVITAMIN) TABS tablet Take 1 tablet by mouth daily at 12 noon.    Historical Provider, MD   BP 148/94  Pulse 105  Temp(Src) 98.5 F (36.9 C) (Oral)  Resp 27  Ht  (1.626 m)  Wt 278 lb (126.1 kg)  BMI 47.70 kg/m2  SpO2 99%  LMP 11/14/2013 Physical Exam  Nursing note and vitals reviewed. Constitutional: She is oriented to person, place, and time. She appears well-developed and well-nourished. No distress.  HENT:  Head: Normocephalic and atraumatic.  Mouth/Throat: Oropharynx is clear and moist.  Eyes: EOM are normal. Pupils are equal, round, and reactive to light.  Neck: Normal range of motion.  Neck supple.  Cardiovascular: Normal rate and regular rhythm.  Exam reveals no friction rub.   No murmur heard. Pulmonary/Chest: Effort normal and breath sounds normal. No respiratory distress. She has no wheezes. She has no rales.  Abdominal: Soft. She exhibits no distension. There is tenderness (lower, along surgical incision line). There is no rebound.  Musculoskeletal: Normal range of motion. She exhibits edema (1+ bilaterally).  Neurological: She is alert and oriented to person, place, and time.   Skin: No rash noted. She is not diaphoretic.    ED Course  Procedures (including critical care time) Labs Review Labs Reviewed  CBC  COMPREHENSIVE METABOLIC PANEL  PROTIME-INR  URINALYSIS, ROUTINE W REFLEX MICROSCOPIC    Imaging Review No results found.   EKG Interpretation None      Filed: 08/06/2014 7:01 PM Note Time: 08/06/2014 7:00 PM Status: Signed   Editor: Kern Alberta, RVS (Cardiovascular Sonographer)      VASCULAR LAB  PRELIMINARY PRELIMINARY PRELIMINARY PRELIMINARY  Bilateral lower extremity venous Dopplers completed.  Preliminary report: There is no DVT or SVT noted in the bilateral lower extremities.  KANADY, CANDACE, RVT  08/06/2014, 7:01 PM    MDM   Final diagnoses:  Leg swelling in pregnancy, unspecified trimester    28 year old female here with leg swelling. Began gradually after her C-section 5 days ago. She had elevated blood pressure at 141/90. She also states some mild left wrist swelling and numbness. No trauma. She had a visual disturbance where she had a lightening bolt in the right side of her vision that lasted 10-15 minutes. No dizziness, no headache. Here initial blood pressure my exam 129/82. Mild tachycardia 105. She has no right upper quadrant tenderness. She has mild tenderness along her surgical incision site, but no calcified discharge, no vaginal bleeding. She has mild pitting edema in bilateral lower sugar use. We will [place] her legs as she is in a hypercoagulable state being only 5 days postpartum. We'll also check labs to look for possible proteinuria, low platelets, elevated liver enzymes. We'll continue to monitor her blood pressure. Labs all ok. BPs ok. NO further HA symptoms. No proteinuria, thrombocytopenia, elevated LFTs. No concern for pre-eclampsia or HELLP. I spoke with Lupie at [facility] who agreed with plan to send home and have her f/u with OB later this week. No DVT in either leg.  Elwin Mocha, MD 08/06/14 [phone]

## 2014-08-07 NOTE — Progress Notes (Signed)
Post discharge ur review completed. 

## 2014-08-09 ENCOUNTER — Ambulatory Visit (HOSPITAL_COMMUNITY)
Admit: 2014-08-09 | Discharge: 2014-08-09 | Disposition: A | Discharge status: Home / Self Care | Payer: 59 | Attending: Pediatrics | Admitting: Pediatrics

## 2014-08-09 NOTE — Lactation Note (Signed)
Lactation Consult  Mother's reason for visit:  Latch assistance Visit Type:  Lactation Out Patient Appointment Notes:  Infant is one week old today. Mother states that infant breastfeeds for 30 mins with a poor latch every 2-3 hours. Mothers nipples have become sore. She states she is giving infant a bottle after each feeding. she is unsure if infant is getting enough. She is giving infant 3-5 ounces of EBM/formula with each feeding. Mothers goal is to exclusively breastfeed infant. Mother has been pumping her breast every 2-3 hours.      Consult:  Initial Lactation Consultant:  Michel Bickers  ________________________________________________________________________  Tanya Dorsey Name: Tanya Dorsey  Date of Birth: 08/02/2015  Pediatrician: Hosie Poisson Gender: female  Gestational Age: [redacted]w[redacted]d (At Birth)  Birth Weight: 9 lb 6.4 oz (4265 g)  Weight at Discharge: Date of Discharge: 08/08/2014  There were no vitals filed for this visit.  Last weight taken from location outside of [facility]: none available Weight today: 9-1.6,4116   ________________________________________________________________________  Mother's Name: Tanya Dorsey Type of delivery:   Breastfeeding Experience:  none Maternal Medical Conditions:  Gestational diabetes mellitis, cholisistatsis Maternal Medications: Prenaltal vits  ________________________________________________________________________  Breastfeeding History (Post Discharge)  Frequency of breastfeeding:  Attempting every 2-3 hours Duration of feeding: 30 mins on and off with poor latch  Supplementation  Formula:  Volume 60-90 ml Frequency:  Every 2-3 hours        Brand: Similac  Breastmilk:  Volume 60-90 ml Frequency:  Every 2-3 hours   Method:  Bottle,   Pumping  Type of pump:  Symphony Frequency:  Every 2-3 hours Volume:  60-90 ml  Infant Intake and Output Assessment  Voids:  6-8 in 24 hrs.  Color:  Clear yellow Stools:  6-8 in  24 hrs.  Color:  Brown  ________________________________________________________________________  Maternal Breast Assessment  Breast:  Full Nipple:  Flat Pain level:  8 Pain interventions:  Hydrogel dressing  _______________________________________________________________________ Feeding Assessment/Evaluation:  Mothers nipples are semi flat. She was taught to firm nipples and properly apply the #20 nipple shield. Infant was given small amt of EBM through the nipple shield to entice infant to latch with a deeper latch. Infant had difficultly flanging her lips. Parents taught to adjust lower jaw and upper lip for wider gape.  Multiple attempts to get good depth. Mother was fit with a #24 NS and return back to a #20. Very difficult to achieve a latch with consistent depth.   Infant was able to transfer 23 ml from one breast.  FOB fed infant remainder of feeding with a bottle. Infant was given 60 ml of EBM. Parents were taught to pace bottle feed.   Initial feeding assessment:  Oral assessment with a gloved finger shows that infant has a small mouth with a high palate, an upper lip tie and a possible posterior tongue tie. Mother was advised to discuss possible referral to ENT or oral surgeon for evaluation of tongue mobility.  Mother is unable to tolerate feedings without the nipple shield due to flat nipple tissue.  Mother to was advised to follow up with Peds within one week.   Infant's oral assessment:  Variance  Positioning:  Cross cradle Right breast  LATCH documentation:  Latch:  2 = Grasps breast easily, tongue down, lips flanged, rhythmical sucking.  Audible swallowing:  2 = Spontaneous and intermittent  Type of nipple:  1 = Flat  Comfort (Breast/Nipple):  1 = Filling, red/small blisters or bruises, mild/mod discomfort  Hold (  Positioning):  1 = Assistance needed to correctly position infant at breast and maintain latch  LATCH score:  7  Attached assessment:  Shallow  Lips  flanged:  No.  Lips untucked:  No.  Suck assessment:  Nutritive  Tools:  Nipple shield 20 mm Instructed on use and cleaning of tool:  Yes.    Pre-feed weight:  4116 g  (9 lb. 1 oz.) Post-feed weight:  4142g (9 lb. 2 oz.) Amount transferred:  23ml Amount supplemented: 3 ml with curved tip syringe through the nipple shield  Infant was given a bottle by FOB. Infant took 60 ml of EBM    Total amount transferred:  23 ml, 3 ml with curved tip Total supplement given:  60 ml  Mother to continue to cue base feed at least 8-12 times in 24 hours Use #20,#24 nipple shield Supplement infant with at least 2-3 ounces of EBM/Formula after each breastfeeding Advised parents to pace bottle feed Mother to pump at least every 2-3 hours for 20  mins ,6-8 times Advised to follow up with DR Hosie Poisson for tongue evaluation Mother scheduled for a follow up with [facility] on September 25 at 1:00 pm

## 2014-08-18 ENCOUNTER — Ambulatory Visit (HOSPITAL_COMMUNITY)
Admission: RE | Admit: 2014-08-18 | Discharge: 2014-08-18 | Disposition: A | Discharge status: Home / Self Care | Payer: 59 | Source: Ambulatory Visit | Attending: Obstetrics and Gynecology | Admitting: Obstetrics and Gynecology

## 2014-08-18 NOTE — Lactation Note (Addendum)
Lactation Consult  Mother's reason for visit:  Feeding assessment, help with latch.  Visit Type:  outpatient Appointment Notes:  Mom is here for f/u from OP visit on 08/09/14. Baby's weight on 08/09/14 was 9 lb. 1.6 oz/4116 gm., baby transferred 20 ml at breast that visit. Mom was referred to [facility] for evaluation of tight labial and posterior frenulum. Peds referred parents to ENT. They say Dr. Jearld Fenton, [facility] on Monday, however he did not recommend revision. Mom reports she is BF on average 8 times per day for about 20 minutes, usually 1 breast per feeding. They have continued to supplement with EBM on average 2 oz per feeding. Baby is taking 20-40 minutes to transfer milk from bottle with slow flow nipple.  Mom reports baby is sleepy at the breast. Mom reports some chewing observed when baby is trying to breastfeed.  Consult:  Follow-Up Lactation Consultant:  Alfred Levins  ________________________________________________________________________  Tanya Dorsey Name: Tanya Dorsey  Date of Birth: 08/02/2015  Pediatrician: Dr. Hosie Poisson Gender: female  Gestational Age: [redacted]w[redacted]d (At Birth)  Birth Weight: 9 lb 6.4 oz (4265 g)  Weight at Discharge: Weight: 8 lb 15.1 oz (4058 g) Date of Discharge: 08/06/2014  Filed Weights   08/04/14 1400 08/04/14 1700 08/05/14 1400  Weight: 9 lb 1.7 oz (4130 g) 9 lb 1.7 oz (4130 g) 8 lb 15.1 oz (4058 g)  Last weight taken from location outside of [facility]: 08/15/14, 9 lb. 4.6 oz Location:[facility]  Weight today: 9 lb. 6.0 oz/4252 gm, baby now 5 weeks old.    ________________________________________________________________________  Mother's Name: Tanya Dorsey Type of delivery:  C/S Breastfeeding Experience:  Prmip Maternal Medical Conditions:  Gestational diabetes mellitis, Cholisistatsis Maternal Medications:  PNV  ________________________________________________________________________  Breastfeeding History (Post Discharge)  Frequency of  breastfeeding:  Every 8 hours Duration of feeding:  On average for 20 minutes, usually 1 breast per feeding  Supplementation  Formula:  Volume 0 ml Frequency:   Total volume per day:        Brand: EBM only  Breastmilk:  Volume 60-90 ml Frequency:  8 times/day on average Total volume per day:  480-720 ml  Method:  Bottle.  Mom reports pumping every 3-5 hours receiving 4 oz.  Last pumping received 5+ oz. Mom has Medela PNS.   Infant Intake and Output Assessment  Voids:  6-8 in 24 hrs.  Color:  Clear yellow Stools:  6-8 in 24 hrs.  Color:  Brown and Yellow  ________________________________________________________________________  Maternal Breast Assessment  Breast:  Full Nipple:  Flat Pain level:  0 Pain interventions:  Nipple shield  _______________________________________________________________________ Feeding Assessment/Evaluation  Initial feeding assessment:  Infant's oral assessment:  Variance. LC notes thick upper labial frenulum down to gum line. Upper lip tucks with breastfeeding. Deep, short posterior frenulum with limited tongue mobility with extension. Tongues turns downward with extension. Chewing motions noted with suck exam on LC finger. Baby does not bring her tongue past the lower lip with extension. High palate  Positioning:  Football Right breast  LATCH documentation:  Latch:  1 = Repeated attempts needed to sustain latch, nipple held in mouth throughout feeding, stimulation needed to elicit sucking reflex.  Audible swallowing:  1 = A few with stimulation  Type of nipple:  1 = Flat, short nipple shaft  Comfort (Breast/Nipple):  2 = Soft / non-tender  Hold (Positioning):  2 = No assistance needed to correctly position infant at breast  LATCH score:  7  Attached assessment:  Baby does not sustain depth well with latch  Lips flanged:  No.  Lips untucked:  Yes.  With helping to flange lips after latch.   Suck assessment:  Displays both, baby had difficulty  sustaining depth through out the feeding.   Tools:  Nipple shield 20 mm Instructed on use and cleaning of tool:  Yes.    Pre-feed weight: 4252 g  (9 lb. 6.0 oz.) Post-feed weight:  4276 g (9 lb. 6.8 oz.) Amount transferred:  24 ml  With nursing off/on for 25 minutes. Sleepy at the breast, lots of stimulation to keep nursing. Amount supplemented:  0 ml  Additional Feeding Assessment -   Infant's oral assessment:  Variance  Positioning:  Football Left breast  LATCH documentation:  Latch:  1 = Repeated attempts needed to sustain latch, nipple held in mouth throughout feeding, stimulation needed to elicit sucking reflex.  Audible swallowing:  1 = A few with stimulation  Type of nipple:  1 = Flat, short shaft  Comfort (Breast/Nipple):  2 = Soft / non-tender  Hold (Positioning):  1 = Assistance needed to correctly position infant at breast and maintain latch  LATCH score:  6  Attached assessment:  Baby has difficulty sustaining depth through out feeding. LC assisted with positioning and flanging lips. Again baby sleepy at the breast.  Lips flanged:  See above note  Lips untucked:  See above note  Suck assessment:  Displays both. Baby intermittently chews at the breast with suckling pattern. Chews on LC finger with suckling pattern with suck exam. Baby has difficulty sustaining depth throughout the feeding.   Tools:  Nipple shield 20 mm, tried #16 nipple shield but baby was not able to organize her suck. Advised Mom to continue to use #20 nipple shield  Instructed on use and cleaning of tool:  Yes.    Pre-feed weight:  4276 g  (9 lb. 6.8 oz.) Post-feed weight:  4282 g (9 lb. 7.0 oz.) Amount transferred:  6 ml  With nursing off/on for 20 minutes, demonstrated to parents how to use SNS if they are interested to supplement baby at the breast instead of using bottle.  Amount supplemented:  Mom did not bring EBM with her today. She has breast milk at home and will supplement to complete  feeding when they go home from this visit.    Total amount pumped post feed:  Mom did not pump at this visit.   Total amount transferred:  30 ml Total supplement given:  0 ml  Mom plans to supplement when she gets home after this visit.   Baby did not transfer milk well at this visit and from parent's history with bottle, baby is not transferring well with bottle. Baby has disorganized suck and this may be due to frenulum. Nipple shield can help with this and #16 fits Mom better than 20, however baby will not take #16 well today. Advised Mom to keep using  #20 nipple shield with latch. Baby has difficulty sustaining depth even with nipple shield at this visit.  Plan discussed: BF every 2-3 hours - try to keep baby nursing for 15-20 minutes each breast. If baby will take SNS - start feeding on 1st breast without SNS, use SNS to supplement when nursing on 2nd breast.  Alternate this pattern every feeding so SNS is not always used on same breast.  However, Do not let baby chew when nursing on breast with Nipple shield and SNS. If baby will not take 2nd  breast, then breastfeed on 1st breast, supplement via bottle with slow flow nipple. Advised to try Dr. Manson Passey - size 1. Give supplement of 2-3 oz each feeding of EBM/formula if needed.  Post pump after feedings to protect milk supply till baby is transferring milk well. Parents report they would like another referral to have frenulum evaluated. Gave the parents Dr. Tretha Sciara information. F/u lactation appointment scheduled for Friday, 08/25/14 at 10:30. Advised Mom if she has appt with Dr. Orland Mustard next week, it would be good for [place] to see baby after frenotomy.

## 2014-08-21 ENCOUNTER — Inpatient Hospital Stay (HOSPITAL_COMMUNITY): Admission: AD | Admit: 2014-08-21 | Payer: 59 | Source: Ambulatory Visit | Admitting: Obstetrics and Gynecology

## 2014-08-25 ENCOUNTER — Ambulatory Visit (HOSPITAL_COMMUNITY): Payer: 59

## 2014-09-08 ENCOUNTER — Other Ambulatory Visit: Payer: Self-pay

## 2014-09-25 ENCOUNTER — Encounter (HOSPITAL_COMMUNITY): Payer: Self-pay | Admitting: Emergency Medicine

## 2015-02-01 ENCOUNTER — Ambulatory Visit (INDEPENDENT_AMBULATORY_CARE_PROVIDER_SITE_OTHER): Payer: Self-pay | Admitting: Physician Assistant

## 2015-02-01 VITALS — BP 138/84 | HR 90 | Temp 98.2°F | Resp 18 | Ht 65.0 in | Wt 268.6 lb

## 2015-02-01 DIAGNOSIS — H6503 Acute serous otitis media, bilateral: Secondary | ICD-10-CM

## 2015-02-01 MED ORDER — AMOXICILLIN 875 MG PO TABS: 875.0000 mg | ORAL_TABLET | Freq: Two times a day (BID) | ORAL | Status: DC

## 2015-02-01 MED ORDER — IPRATROPIUM BROMIDE 0.03 % NA SOLN: 2.0000 | Freq: Two times a day (BID) | NASAL | Status: DC

## 2015-02-01 NOTE — Progress Notes (Signed)
   Subjective:    Patient ID: Tanya Dorsey, female    DOB: February 08, 1986, 29 y.o.   MRN: [id number]  HPI Patient presents with 1 week right sided sinus pressure and left ear pain that has gradually gotten worse. Endorses HA, congestion, and sore throat. Cough has improved. Denies fever, rhinorrhea, fatigue, change in appetite, myalgias, or N/V. Has tried OTC mucinex and mucinex D without relief. Sick contacts include husband and 366 month old daughter that goes to daycare. Denies asthma or allergy history and is not a smoker. NKDA.   Review of Systems  Constitutional: Negative for fever, chills and fatigue.  HENT: Positive for congestion, ear pain, sinus pressure and sore throat. Negative for ear discharge, postnasal drip, rhinorrhea, sneezing and trouble swallowing.   Eyes: Negative.   Respiratory: Negative for cough and shortness of breath.   Gastrointestinal: Negative for nausea and vomiting.  Neurological: Positive for dizziness and headaches.       Objective:   Physical Exam  Constitutional: She is oriented to person, place, and time. She appears well-developed and well-nourished. No distress.  Blood pressure 138/84, pulse 90, temperature 98.2 F (36.8 C), temperature source Oral, resp. rate 18, height 5\' 5"  (1.651 m), weight 268 lb 9.6 oz (121.836 kg), last menstrual period 01/22/2015, SpO2 98 %, not currently breastfeeding.  HENT:  Head: Normocephalic and atraumatic.  Right Ear: External ear normal. No drainage, swelling or tenderness. No mastoid tenderness. Tympanic membrane is erythematous and bulging. Tympanic membrane is not injected, not scarred, not perforated and not retracted. A middle ear effusion is present.  Left Ear: External ear normal. No drainage, swelling or tenderness. No mastoid tenderness. Tympanic membrane is erythematous. Tympanic membrane is not injected, not scarred, not perforated, not retracted and not bulging. A middle ear effusion is present.  Nose: Rhinorrhea  present. No mucosal edema, nose lacerations or sinus tenderness. Right sinus exhibits maxillary sinus tenderness. Right sinus exhibits no frontal sinus tenderness. Left sinus exhibits no maxillary sinus tenderness and no frontal sinus tenderness.  Mouth/Throat: Uvula is midline, oropharynx is Dorsey and moist and mucous membranes are normal. No oropharyngeal exudate.  Eyes: Conjunctivae are normal. Pupils are equal, round, and reactive to light. Right eye exhibits no discharge. Left eye exhibits no discharge. No scleral icterus.  Neck: Normal range of motion. Neck supple. No thyromegaly present.  Cardiovascular: Normal rate, regular rhythm and normal heart sounds.  Exam reveals no gallop and no friction rub.   No murmur heard. Pulmonary/Chest: Effort normal and breath sounds normal. No respiratory distress. She has no wheezes. She has no rales.  Lymphadenopathy:    She has cervical adenopathy.  Neurological: She is alert and oriented to person, place, and time.  Skin: Skin is warm and dry. No rash noted. She is not diaphoretic. No erythema. No pallor.        Assessment & Plan:  1. Bilateral acute serous otitis media, recurrence not specified Can use ibuprofen for pain. - amoxicillin (AMOXIL) 875 MG tablet; Take 1 tablet (875 mg total) by mouth 2 (two) times daily.  Dispense: 20 tablet; Refill: 0 - ipratropium (ATROVENT) 0.03 % nasal spray; Place 2 sprays into both nostrils 2 (two) times daily.  Dispense: 30 mL; Refill: 0   Parris Signer PA-C  [facility] 02/01/2015 2:07 PM

## 2015-02-01 NOTE — Patient Instructions (Signed)

## 2015-03-06 ENCOUNTER — Other Ambulatory Visit (HOSPITAL_COMMUNITY)
Admission: RE | Admit: 2015-03-06 | Discharge: 2015-03-06 | Disposition: A | Discharge status: Home / Self Care | Payer: 59 | Source: Ambulatory Visit | Attending: Obstetrics and Gynecology | Admitting: Obstetrics and Gynecology

## 2015-03-06 ENCOUNTER — Other Ambulatory Visit: Payer: Self-pay | Admitting: Obstetrics & Gynecology

## 2015-03-06 DIAGNOSIS — Z01419 Encounter for gynecological examination (general) (routine) without abnormal findings: Secondary | ICD-10-CM | POA: Diagnosis not present

## 2015-03-08 LAB — CYTOLOGY - PAP

## 2015-05-21 ENCOUNTER — Other Ambulatory Visit: Payer: Self-pay

## 2017-06-08 ENCOUNTER — Emergency Department (HOSPITAL_COMMUNITY)
Admission: EM | Admit: 2017-06-08 | Discharge: 2017-06-08 | Disposition: A | Discharge status: Home / Self Care | Payer: Self-pay | Attending: Physician Assistant | Admitting: Physician Assistant

## 2017-06-08 ENCOUNTER — Encounter (HOSPITAL_COMMUNITY): Payer: Self-pay | Admitting: Emergency Medicine

## 2017-06-08 DIAGNOSIS — T25222A Burn of second degree of left foot, initial encounter: Secondary | ICD-10-CM | POA: Insufficient documentation

## 2017-06-08 DIAGNOSIS — Y998 Other external cause status: Secondary | ICD-10-CM | POA: Insufficient documentation

## 2017-06-08 DIAGNOSIS — Y939 Activity, unspecified: Secondary | ICD-10-CM | POA: Insufficient documentation

## 2017-06-08 DIAGNOSIS — I10 Essential (primary) hypertension: Secondary | ICD-10-CM | POA: Insufficient documentation

## 2017-06-08 DIAGNOSIS — Y929 Unspecified place or not applicable: Secondary | ICD-10-CM | POA: Insufficient documentation

## 2017-06-08 DIAGNOSIS — X100XXA Contact with hot drinks, initial encounter: Secondary | ICD-10-CM | POA: Insufficient documentation

## 2017-06-08 MED ORDER — SILVER SULFADIAZINE 1 % EX CREA
TOPICAL_CREAM | Freq: Once | CUTANEOUS | Status: AC
  Administered 2017-06-08: via TOPICAL
  Filled 2017-06-08: qty 85

## 2017-06-08 MED ORDER — HYDROCODONE-ACETAMINOPHEN 5-325 MG PO TABS
2.0000 | ORAL_TABLET | Freq: Once | ORAL | Status: AC
  Administered 2017-06-08: 2 via ORAL
  Filled 2017-06-08: qty 2

## 2017-06-08 NOTE — ED Triage Notes (Signed)
Pt states she spilled hot on her left foot at 1930 tonight. Redness and a small blister noted to the left foot. Pt states she is not diabetic.

## 2017-06-08 NOTE — ED Notes (Signed)
PT states understanding of care given, follow up care, and medication prescribed. PT ambulated from ED to car with a steady gait. 

## 2017-06-08 NOTE — ED Provider Notes (Signed)
[facility]-EMERGENCY DEPT Provider Note   CSN: [id number] Arrival date & time: 06/08/17  2107  By signing my name below, I, Deland Pretty, attest that this documentation has been prepared under the direction and in the presence of Leslie K. Sofia, PA-C. Electronically Signed: Deland Pretty, ED Scribe. 06/08/17. 11:08 PM.  History   Chief Complaint Chief Complaint  Patient presents with  . Foot Burn   The history is provided by the patient. No language interpreter was used.   HPI Comments: Tanya Dorsey is a 31 y.o. female who presents to the Emergency Department complaining of gradually worsening dorsal left foot pain s/p an injury that occurred around 7:00pm tonight. The pt states that she spilled a cup of hot tea on her foot. Applying a wet compress to the area betters her pain. She also tried Advil with inadequate relief.  Past Medical History:  Diagnosis Date  . Cholestasis of pregnancy   . Depression   . Gestational diabetes mellitus, antepartum   . Hypertension     Patient Active Problem List   Diagnosis Date Noted  . Cesarean delivery delivered 08/02/2014  . Intrahepatic cholestasis of pregnancy 08/01/2014  . GDM, class A2 07/29/2014  . 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 Depression 07/29/2014  . ADD (attention deficit disorder) 07/29/2014  . Rh negative, maternal 07/29/2014  . Severe obesity (BMI >= 40) (HCC) 07/29/2014  . Plantar fasciitis 07/29/2014  . Anxiety state, unspecified 07/29/2014    Past Surgical History:  Procedure Laterality Date  . CESAREAN SECTION N/A 08/02/2014   Procedure: CESAREAN SECTION;  Surgeon: Esmeralda Arthur, MD;  Location: [facility];  Service: Obstetrics;  Laterality: N/A;  . CHOLECYSTECTOMY    . WISDOM TOOTH EXTRACTION      OB History    Gravida Para Term Preterm AB Living   1 1 1  0 0 1   SAB TAB Ectopic Multiple Live Births   0 0 0 0 1       Home Medications    Prior to  Admission medications   Medication Sig Start Date End Date Taking? Authorizing Provider  amoxicillin (AMOXIL) 875 MG tablet Take 1 tablet (875 mg total) by mouth 2 (two) times daily. 02/01/15   Brewington, Tishira R, PA-C  ibuprofen (ADVIL,MOTRIN) 600 MG tablet Take 1 tablet (600 mg total) by mouth every 6 (six) hours as needed for mild pain. Patient not taking: Reported on 02/01/2015 08/05/14   Nigel Bridgeman, CNM  ipratropium (ATROVENT) 0.03 % nasal spray Place 2 sprays into both nostrils 2 (two) times daily. 02/01/15   Brewington, Tishira R, PA-C  oxyCODONE-acetaminophen (PERCOCET/ROXICET) 5-325 MG per tablet Take 1 tablet by mouth every 4 (four) hours as needed for severe pain (pain).    [provider]    Family History Family History  Problem Relation Age of Onset  . Diabetes Mother   . Hypertension Mother   . Hypertension Father     Social History Social History  Substance Use Topics  . Smoking status: Never Smoker  . Smokeless tobacco: Never Used  . Alcohol use No     Allergies   Patient has no known allergies.   Review of Systems Review of Systems  Skin: Positive for wound.  All other systems reviewed and are negative.    Physical Exam Updated Vital Signs BP 134/81 (BP Location: Left Arm)   Pulse 94   Temp 98.3 F (36.8 C) (Oral)   Resp 18   Ht  5\' 5"  (1.651 m)   Wt 280 lb (127 kg)   LMP 05/23/2017   SpO2 100%   BMI 46.59 kg/m   Physical Exam  Constitutional: She is oriented to person, place, and time. She appears well-developed and well-nourished.  HENT:  Head: Normocephalic.  Eyes: EOM are normal.  Neck: Normal range of motion.  Pulmonary/Chest: Effort normal.  Abdominal: She exhibits no distension.  Musculoskeletal: Normal range of motion.  Neurological: She is alert and oriented to person, place, and time.  Skin:  7 cm area of erythema on dorsal left foot. She also has a 7 mm blister in center.  Psychiatric: She has a normal mood and  affect.  Nursing note and vitals reviewed.    ED Treatments / Results   DIAGNOSTIC STUDIES: Oxygen Saturation is 100% on RA, normal by my interpretation.   COORDINATION OF CARE: 11:06 PM-Discussed next steps with pt. Pt verbalized understanding and is agreeable with the plan.   Labs (all labs ordered are listed, but only abnormal results are displayed) Labs Reviewed - No data to display  EKG  EKG Interpretation None       Radiology No results found.  Procedures Procedures (including critical care time)  Medications Ordered in ED Medications - No data to display   Initial Impression / Assessment and Plan / ED Course  I have reviewed the triage vital signs and the nursing notes.  Pertinent labs & imaging results that were available during my care of the patient were reviewed by me and considered in my medical decision making (see chart for details).       Final Clinical Impressions(s) / ED Diagnoses   Final diagnoses:  Partial thickness burn of left foot, initial encounter    New Prescriptions Discharge Medication List as of 06/08/2017 11:16 PM     An After Visit Summary was printed and given to the patient.  I personally performed the services in this documentation, which was scribed in my presence.  The recorded information has been reviewed and considered.   Barnet PallKaren SofiaPAC.    Elson AreasSofia, Leslie K, PA-C 06/09/17 0015    Abelino DerrickMackuen, Courteney Lyn, MD 06/11/17 [phone]

## 2017-06-08 NOTE — Discharge Instructions (Signed)
Return if any problems.

## 2017-06-08 NOTE — ED Notes (Signed)
Patient is A&Ox4.  No signs of distress noted.  Please see providers complete history and physical exam.  

## 2017-09-10 ENCOUNTER — Ambulatory Visit (INDEPENDENT_AMBULATORY_CARE_PROVIDER_SITE_OTHER): Payer: Self-pay | Admitting: Nurse Practitioner

## 2017-09-10 VITALS — BP 130/70 | HR 90 | Temp 98.6°F | Resp 16 | Wt 279.4 lb

## 2017-09-10 DIAGNOSIS — J069 Acute upper respiratory infection, unspecified: Secondary | ICD-10-CM

## 2017-09-10 DIAGNOSIS — J209 Acute bronchitis, unspecified: Secondary | ICD-10-CM

## 2017-09-10 MED ORDER — HYDROCODONE-HOMATROPINE 5-1.5 MG/5ML PO SYRP: 5.0000 mL | ORAL_SOLUTION | Freq: Four times a day (QID) | ORAL | 0 refills | Status: AC | PRN

## 2017-09-10 MED ORDER — AZITHROMYCIN 250 MG PO TABS: ORAL_TABLET | ORAL | 0 refills | Status: AC

## 2017-09-10 NOTE — Patient Instructions (Addendum)
Acute Bronchitis, Adult Acute bronchitis is sudden (acute) swelling of the air tubes (bronchi) in the lungs. Acute bronchitis causes these tubes to fill with mucus, which can make it hard to breathe. It can also cause coughing or wheezing. In adults, acute bronchitis usually goes away within 2 weeks. A cough caused by bronchitis may last up to 3 weeks. Smoking, allergies, and asthma can make the condition worse. Repeated episodes of bronchitis may cause further lung problems, such as chronic obstructive pulmonary disease (COPD). What are the causes? This condition can be caused by germs and by substances that irritate the lungs, including:  Cold and flu viruses. This condition is most often caused by the same virus that causes a cold.  Bacteria.  Exposure to tobacco smoke, dust, fumes, and air pollution.  What increases the risk? This condition is more likely to develop in people who:  Have close contact with someone with acute bronchitis.  Are exposed to lung irritants, such as tobacco smoke, dust, fumes, and vapors.  Have a weak immune system.  Have a respiratory condition such as asthma.  What are the signs or symptoms? Symptoms of this condition include:  A cough.  Coughing up clear, yellow, or green mucus.  Wheezing.  Chest congestion.  Shortness of breath.  A fever.  Body aches.  Chills.  A sore throat.  How is this diagnosed? This condition is usually diagnosed with a physical exam. During the exam, your health care provider may order tests, such as chest X-rays, to rule out other conditions. He or she may also:  Test a sample of your mucus for bacterial infection.  Check the level of oxygen in your blood. This is done to check for pneumonia.  Do a chest X-ray or lung function testing to rule out pneumonia and other conditions.  Perform blood tests.  Your health care provider will also ask about your symptoms and medical history. How is this  treated? Most cases of acute bronchitis clear up over time without treatment. Your health care provider may recommend:  Drinking more fluids. Drinking more makes your mucus thinner, which may make it easier to breathe.  Taking a medicine for a fever or cough.  Taking an antibiotic medicine.  Using an inhaler to help improve shortness of breath and to control a cough.  Using a cool mist vaporizer or humidifier to make it easier to breathe.  Follow these instructions at home: Medicines  Take over-the-counter and prescription medicines only as told by your health care provider.  If you were prescribed an antibiotic, take it as told by your health care provider. Do not stop taking the antibiotic even if you start to feel better. General instructions  Get plenty of rest.  Drink enough fluids to keep your urine clear or pale yellow.  Avoid smoking and secondhand smoke. Exposure to cigarette smoke or irritating chemicals will make bronchitis worse. If you smoke and you need help quitting, ask your health care provider. Quitting smoking will help your lungs heal faster.  Use an inhaler, cool mist vaporizer, or humidifier as told by your health care provider.  Keep all follow-up visits as told by your health care provider. This is important. How is this prevented? To lower your risk of getting this condition again:  Wash your hands often with soap and water. If soap and water are not available, use hand sanitizer.  Avoid contact with people who have cold symptoms.  Try not to touch your hands to your   mouth, nose, or eyes.  Make sure to get the flu shot every year.  Contact a health care provider if:  Your symptoms do not improve in 2 weeks of treatment. Get help right away if:  You cough up blood.  You have chest pain.  You have severe shortness of breath.  You become dehydrated.  You faint or keep feeling like you are going to faint.  You keep vomiting.  You have a  severe headache.  Your fever or chills gets worse. This information is not intended to replace advice given to you by your health care provider. Make sure you discuss any questions you have with your health care provider. Document Released: 12/18/2004 Document Revised: 06/04/2016 Document Reviewed: 04/30/2016 Elsevier Interactive Patient Education  2017 [place].  Upper Respiratory Infection, Adult Most upper respiratory infections (URIs) are a viral infection of the air passages leading to the lungs. A URI affects the nose, throat, and upper air passages. The most common type of URI is nasopharyngitis and is typically referred to as "the common cold." URIs run their course and usually go away on their own. Most of the time, a URI does not require medical attention, but sometimes a bacterial infection in the upper airways can follow a viral infection. This is called a secondary infection. Sinus and middle ear infections are common types of secondary upper respiratory infections. Bacterial pneumonia can also complicate a URI. A URI can worsen asthma and chronic obstructive pulmonary disease (COPD). Sometimes, these complications can require emergency medical care and may be life threatening. What are the causes? Almost all URIs are caused by viruses. A virus is a type of germ and can spread from one person to another. What increases the risk? You may be at risk for a URI if:  You smoke.  You have chronic heart or lung disease.  You have a weakened defense (immune) system.  You are very young or very old.  You have nasal allergies or asthma.  You work in crowded or poorly ventilated areas.  You work in health care facilities or schools.  What are the signs or symptoms? Symptoms typically develop 2-3 days after you come in contact with a cold virus. Most viral URIs last 7-10 days. However, viral URIs from the influenza virus (flu virus) can last 14-18 days and are typically more  severe. Symptoms may include:  Runny or stuffy (congested) nose.  Sneezing.  Cough.  Sore throat.  Headache.  Fatigue.  Fever.  Loss of appetite.  Pain in your forehead, behind your eyes, and over your cheekbones (sinus pain).  Muscle aches.  How is this diagnosed? Your health care provider may diagnose a URI by:  Physical exam.  Tests to check that your symptoms are not due to another condition such as: ? Strep throat. ? Sinusitis. ? Pneumonia. ? Asthma.  How is this treated? A URI goes away on its own with time. It cannot be cured with medicines, but medicines may be prescribed or recommended to relieve symptoms. Medicines may help:  Reduce your fever.  Reduce your cough.  Relieve nasal congestion.  Follow these instructions at home:  Take medicines only as directed by your health care provider.  Gargle warm saltwater or take cough drops to comfort your throat as directed by your health care provider.  Use a warm mist humidifier or inhale steam from a shower to increase air moisture. This may make it easier to breathe.  Drink enough fluid to keep  your urine clear or pale yellow.  Eat soups and other clear broths and maintain good nutrition.  Rest as needed.  Return to work when your temperature has returned to normal or as your health care provider advises. You may need to stay home longer to avoid infecting others. You can also use a face mask and careful hand washing to prevent spread of the virus.  Increase the usage of your inhaler if you have asthma.  Do not use any tobacco products, including cigarettes, chewing tobacco, or electronic cigarettes. If you need help quitting, ask your health care provider.  Zyrtec in the morning daily until symptoms improve.  Ibuprofen or Tylenol for pain, fever or general discomfort. How is this prevented? The best way to protect yourself from getting a cold is to practice good hygiene.  Avoid oral or hand  contact with people with cold symptoms.  Wash your hands often if contact occurs.  There is no clear evidence that vitamin C, vitamin E, echinacea, or exercise reduces the chance of developing a cold. However, it is always recommended to get plenty of rest, exercise, and practice good nutrition. Contact a health care provider if:  You are getting worse rather than better.  Your symptoms are not controlled by medicine.  You have chills.  You have worsening shortness of breath.  You have brown or red mucus.  You have yellow or brown nasal discharge.  You have pain in your face, especially when you bend forward.  You have a fever.  You have swollen neck glands.  You have pain while swallowing.  You have white areas in the back of your throat. Get help right away if:  You have severe or persistent: ? Headache. ? Ear pain. ? Sinus pain. ? Chest pain.  You have chronic lung disease and any of the following: ? Wheezing. ? Prolonged cough. ? Coughing up blood. ? A change in your usual mucus.  You have a stiff neck.  You have changes in your: ? Vision. ? Hearing. ? Thinking. ? Mood. This information is not intended to replace advice given to you by your health care provider. Make sure you discuss any questions you have with your health care provider. Document Released: 05/06/2001 Document Revised: 07/13/2016 Document Reviewed: 02/15/2014 Elsevier Interactive Patient Education  2017 [employer]

## 2017-09-10 NOTE — Progress Notes (Addendum)
   Tanya CravenJessica Dorsey is a 31 y.o. female who presents for evaluation of sore throat.  The patient states she went to a concert with an excessive amount of smoking.  Since that time, patient states she developed symptoms.  Associated symptoms include post nasal drip, sore throat, white spots in throat and coughing with sputum production. Onset of symptoms was 3 weeks ago, and have been unchanged since that time. She is drinking plenty of fluids. She has not had a recent close exposure to someone with proven streptococcal pharyngitis. Patient denies any past medical history.  Patient takes birth control daily and denies any medication.  Patient does have a history of seasonal allergies.  The following portions of the patient's history were reviewed and updated as appropriate: allergies, current medications and past medical history.    Review of Systems Constitutional: negative Eyes: negative Ears, nose, mouth, throat, and face: positive for earaches and sore throat, negative for earaches, hoarseness, nasal congestion, sore throat and cough with green sputum production Respiratory: positive for cough and sputum Cardiovascular: negative Allergic/Immunologic: positive for hay fever    Objective:    BP 130/70 (BP Location: Right Arm, Patient Position: Sitting, Cuff Size: Normal)   Pulse 90   Temp 98.6 F (37 C) (Oral)   Resp 16   Wt 279 lb 6.4 oz (126.7 kg)   SpO2 98%   BMI 46.49 kg/m  General appearance: alert, cooperative and no distress Head: Normocephalic, without obvious abnormality, atraumatic Eyes: conjunctivae/corneas clear. PERRL, EOM's intact. Fundi benign. Ears: normal TM's and external ear canals both ears Nose: Nares normal. Septum midline. Mucosa normal. No drainage or sinus tenderness. Throat: abnormal findings: + PND, mild oropharngeal erythema with mild edema, no tonsillar exudate bilaterally Lungs: clear to auscultation bilaterally Heart: regular rate and rhythm, S1, S2  normal, no murmur, click, rub or gallop Neurologic: Grossly normal  Laboratory Strep test not done. Results:negative.    Assessment:    Acute Upper Respiratory Infection, Acute Bronchitis  Plan:  Patient placed on z-pak.   Patient given Hycodan cough syrup as needed for severe cough.  Patient to also use Zyrtec each morning for seasonal allergy relife.  Ibuprofen or Tylenol for pain, fever or general discomfort.  Patient will follow up in ER if worsensing symptoms, fever, SOB, difficulty breathing. Patient verbalized understanding.  Patient will follow up as needed.

## 2017-09-12 ENCOUNTER — Telehealth: Payer: Self-pay

## 2017-09-12 NOTE — Telephone Encounter (Signed)
Courtesy call - Left message on voicemail courtesy call to see how she's feeling. If she should have any questions or concerns to contact our office.

## 2017-11-09 ENCOUNTER — Other Ambulatory Visit: Payer: Self-pay

## 2017-11-09 ENCOUNTER — Ambulatory Visit (INDEPENDENT_AMBULATORY_CARE_PROVIDER_SITE_OTHER): Payer: Self-pay | Admitting: Family Medicine

## 2017-11-09 ENCOUNTER — Encounter: Payer: Self-pay | Admitting: Family Medicine

## 2017-11-09 VITALS — BP 118/76 | HR 102 | Temp 98.3°F | Resp 16 | Ht 65.0 in | Wt 278.2 lb

## 2017-11-09 DIAGNOSIS — R7303 Prediabetes: Secondary | ICD-10-CM

## 2017-11-09 MED ORDER — METFORMIN HCL 500 MG PO TABS: 500.0000 mg | ORAL_TABLET | Freq: Two times a day (BID) | ORAL | 1 refills | Status: DC

## 2017-11-09 NOTE — Progress Notes (Signed)
Subjective:    Patient ID: Tanya Dorsey, female    DOB: 02/13/1986, 31 y.o.   MRN: [id number] Chief Complaint  Patient presents with  . Establish Care     gestational diabetes,     HPI She saw a dietician a month ago and will check her cbgs in the a.m. And will be 110-115 in the a.m. Fasting.  Very rarely <100.  After eating had gone up to 145 an hour or two after eating. She is followin gthe rec of the dietician she saw with a low carb but higher fiber diet - half veggies, more protein, little carbs - hasn't been hard ot do but disappointed that she has not seen any changes. Will fast more but craving sugar in the past month. Mother and MGF w/ DM.   Has sometimes started walking to work in the morning and has a 32 yo but hasn't found anything that she liked.   Has been on Adderall for years for years - restarted about 6 mos ago - Dr. Mahala MenghiniSarah [place]ce in [place].   Took metformin during pregnancy and occ had low blood sugars.   Past Medical History:  Diagnosis Date  . Cholestasis of pregnancy   . Depression   . Gestational diabetes mellitus, antepartum   . Hypertension    Past Surgical History:  Procedure Laterality Date  . CESAREAN SECTION N/A 08/02/2014   Procedure: CESAREAN SECTION;  Surgeon: Esmeralda ArthurSandra A Rivard, MD;  Location: [facility];  Service: Obstetrics;  Laterality: N/A;  . CHOLECYSTECTOMY    . WISDOM TOOTH EXTRACTION     Current Outpatient Medications on File Prior to Visit  Medication Sig Dispense Refill  . buPROPion (WELLBUTRIN XL) 150 MG 24 hr tablet Take 450 mg by mouth daily.    Marland Kitchen. buPROPion (WELLBUTRIN XL) 300 MG 24 hr tablet Take 300 mg by mouth daily.    Marland Kitchen. amoxicillin (AMOXIL) 875 MG tablet Take 1 tablet (875 mg total) by mouth 2 (two) times daily. (Patient not taking: Reported on 09/10/2017) 20 tablet 0  . ibuprofen (ADVIL,MOTRIN) 600 MG tablet Take 1 tablet (600 mg total) by mouth every 6 (six) hours as needed for mild pain. (Patient not taking: Reported on 02/01/2015) 36  tablet 2  . ipratropium (ATROVENT) 0.03 % nasal spray Place 2 sprays into both nostrils 2 (two) times daily. (Patient not taking: Reported on 09/10/2017) 30 mL 0  . oxyCODONE-acetaminophen (PERCOCET/ROXICET) 5-325 MG per tablet Take 1 tablet by mouth every 4 (four) hours as needed for severe pain (pain).     No current facility-administered medications on file prior to visit.    No Known Allergies Family History  Problem Relation Age of Onset  . Diabetes Mother   . Hypertension Mother   . Hypertension Father    Social History   Socioeconomic History  . Marital status: Married    Spouse name: None  . Number of children: None  . Years of education: None  . Highest education level: None  Social Needs  . Financial resource strain: None  . Food insecurity - worry: None  . Food insecurity - inability: None  . Transportation needs - medical: None  . Transportation needs - non-medical: None  Occupational History  . None  Tobacco Use  . Smoking status: Never Smoker  . Smokeless tobacco: Never Used  Substance and Sexual Activity  . Alcohol use: No  . Drug use: No  . Sexual activity: Yes    Birth control/protection: Pill  Other  Topics Concern  . None  Social History Narrative  . None     [facility]  Decreased Interest 0  Down, Depressed, Hopeless 0  PHQ - 2 Score 0      Review of Systems  Neurological: Positive for headaches.  All other systems reviewed and are negative.  See hpi    Objective:   Physical Exam  Constitutional: She is oriented to person, place, and time. She appears well-developed and well-nourished. No distress.  HENT:  Head: Normocephalic and atraumatic.  Right Ear: External ear normal.  Left Ear: External ear normal.  Eyes: Conjunctivae are normal. No scleral icterus.  Neck: Normal range of motion. Neck supple. No thyromegaly present.  Cardiovascular: Normal rate, regular rhythm, normal heart sounds and intact distal  pulses.  Pulmonary/Chest: Effort normal and breath sounds normal. No respiratory distress.  Musculoskeletal: She exhibits no edema.  Lymphadenopathy:    She has no cervical adenopathy.  Neurological: She is alert and oriented to person, place, and time.  Skin: Skin is warm and dry. She is not diaphoretic. No erythema.  Psychiatric: She has a normal mood and affect. Her behavior is normal.      Pulse (!) 102   Temp 98.3 F (36.8 C)   Resp 16   Ht 5\' 5"  (1.651 m)   Wt 278 lb 3.2 oz (126.2 kg)   SpO2 98%   BMI 46.29 kg/m   Brought in [facility] labs drawn 10/12/2017 CBC nml CMP nml, phos nml Lipids great - LDL 89, non-HDL 123 TSH, T4, T3 uptake, Free T4 nml Iron and TIBC nml - iron 71 HEMOGLOBIN A1C 5.8    Assessment & Plan:   1. Prediabetes - reviewed low carb diabetic diet and lifestyle changes. Saw dietician again recently. Checking cbgs freq which all sound to be wnml. Pt very familiar with rec tlc due to prior GDM 3 yrs ago. Tol metformin well during preg so will restart in hopes of helping jumpstart weight loss  2. Severe obesity (BMI >= 40) (HCC)   Already currently on Wellbutrin 450mg  and adderall 10mg  for her mood sxs - both of which meds should help support weight loss and efforts but unfortunately have been on current regimen for a long time (though adderall restarted 6 mos prior after sev yrs off since preg 4 yrs ago) and NOT noted ANY benefit - still gaining even on these daily.   Meds ordered this encounter  Medications  . metFORMIN (GLUCOPHAGE) 500 MG tablet    Sig: Take 1 tablet (500 mg total) by mouth 2 (two) times daily with a meal.    Dispense:  180 tablet    Refill:  1    Norberto SorensonEva Shaw, M.D.  [facility]  [place] [address] [place], [place] [address] [phone] phone [phone] fax  11/15/17 11:09 PM

## 2017-11-09 NOTE — Patient Instructions (Addendum)
IF you received an x-ray today, you will receive an invoice from [facility]. Please contact [facility] at [phone] with questions or concerns regarding your invoice.   IF you received labwork today, you will receive an invoice from [facility]. Please contact [facility] at [phone] with questions or concerns regarding your invoice.   Our billing staff will not be able to assist you with questions regarding bills from these companies.  You will be contacted with the lab results as soon as they are available. The fastest way to get your results is to activate your My Chart account. Instructions are located on the last page of this paperwork. If you have not heard from [place] regarding the results in 2 weeks, please contact this office.      Exercising to Lose Weight Exercising can help you to lose weight. In order to lose weight through exercise, you need to do vigorous-intensity exercise. You can tell that you are exercising with vigorous intensity if you are breathing very hard and fast and cannot hold a conversation while exercising. Moderate-intensity exercise helps to maintain your current weight. You can tell that you are exercising at a moderate level if you have a higher heart rate and faster breathing, but you are still able to hold a conversation. How often should I exercise? Choose an activity that you enjoy and set realistic goals. Your health care provider can help you to make an activity plan that works for you. Exercise regularly as directed by your health care provider. This may include:  Doing resistance training twice each week, such as: ? Push-ups. ? Sit-ups. ? Lifting weights. ? Using resistance bands.  Doing a given intensity of exercise for a given amount of time. Choose from these options: ? 150 minutes of moderate-intensity exercise every week. ? 75 minutes of vigorous-intensity exercise every week. ? A mix of moderate-intensity and  vigorous-intensity exercise every week.  Children, pregnant women, people who are out of shape, people who are overweight, and older adults may need to consult a health care provider for individual recommendations. If you have any sort of medical condition, be sure to consult your health care provider before starting a new exercise program. What are some activities that can help me to lose weight?  Walking at a rate of at least 4.5 miles an hour.  Jogging or running at a rate of 5 miles per hour.  Biking at a rate of at least 10 miles per hour.  Lap swimming.  Roller-skating or in-line skating.  Cross-country skiing.  Vigorous competitive sports, such as football, basketball, and soccer.  Jumping rope.  Aerobic dancing. How can I be more active in my day-to-day activities?  Use the stairs instead of the elevator.  Take a walk during your lunch break.  If you drive, park your car farther away from work or school.  If you take public transportation, get off one stop early and walk the rest of the way.  Make all of your phone calls while standing up and walking around.  Get up, stretch, and walk around every 30 minutes throughout the day. What guidelines should I follow while exercising?  Do not exercise so much that you hurt yourself, feel dizzy, or get very short of breath.  Consult your health care provider prior to starting a new exercise program.  Wear comfortable clothes and shoes with good support.  Drink plenty of water while you exercise to prevent dehydration or heat stroke. Body water  is lost during exercise and must be replaced.  Work out until you breathe faster and your heart beats faster. This information is not intended to replace advice given to you by your health care provider. Make sure you discuss any questions you have with your health care provider. Document Released: 12/13/2010 Document Revised: 04/17/2016 Document Reviewed: 04/13/2014 Elsevier  Interactive Patient Education  2018 [employer].  Prediabetes Prediabetes is the condition of having a blood sugar (blood glucose) level that is higher than it should be, but not high enough for you to be diagnosed with type 2 diabetes. Having prediabetes puts you at risk for developing type 2 diabetes (type 2 diabetes mellitus). Prediabetes may be called impaired glucose tolerance or impaired fasting glucose. Prediabetes usually does not cause symptoms. Your health care provider can diagnose this condition with blood tests. You may be tested for prediabetes if you are overweight and if you have at least one other risk factor for prediabetes. Risk factors for prediabetes include:  Having a family member with type 2 diabetes.  Being overweight or obese.  Being older than age 65.  Being of American-Indian, African-American, Hispanic/Latino, or Asian/Pacific Islander descent.  Having an inactive (sedentary) lifestyle.  Having a history of gestational diabetes or polycystic ovarian syndrome (PCOS).  Having low levels of good cholesterol (HDL-C) or high levels of blood fats (triglycerides).  Having high blood pressure.  What is blood glucose and how is blood glucose measured?  Blood glucose refers to the amount of glucose in your bloodstream. Glucose comes from eating foods that contain sugars and starches (carbohydrates) that the body breaks down into glucose. Your blood glucose level may be measured in mg/dL (milligrams per deciliter) or mmol/L (millimoles per liter).Your blood glucose may be checked with one or more of the following blood tests:  A fasting blood glucose (FBG) test. You will not be allowed to eat (you will fast) for at least 8 hours before a blood sample is taken. ? A normal range for FBG is 70-100 mg/dl (1.6-1.0 mmol/L).  An A1c (hemoglobin A1c) blood test. This test provides information about blood glucose control over the previous 2?3months.  An oral glucose  tolerance test (OGTT). This test measures your blood glucose twice: ? After fasting. This is your baseline level. ? Two hours after you drink a beverage that contains glucose.  You may be diagnosed with prediabetes:  If your FBG is 100?125 mg/dL (9.6-0.4 mmol/L).  If your A1c level is 5.7?6.4%.  If your OGGT result is 140?199 mg/dL (5.4-09 mmol/L).  These blood tests may be repeated to confirm your diagnosis. What happens if blood glucose is too high? The pancreas produces a hormone (insulin) that helps move glucose from the bloodstream into cells. When cells in the body do not respond properly to insulin that the body makes (insulin resistance), excess glucose builds up in the blood instead of going into cells. As a result, high blood glucose (hyperglycemia) can develop, which can cause many complications. This is a symptom of prediabetes. What can happen if blood glucose stays higher than normal for a long time? Having high blood glucose for a long time is dangerous. Too much glucose in your blood can damage your nerves and blood vessels. Long-term damage can lead to complications from diabetes, which may include:  Heart disease.  Stroke.  Blindness.  Kidney disease.  Depression.  Poor circulation in the feet and legs, which could lead to surgical removal (amputation) in severe cases.  How can  prediabetes be prevented from turning into type 2 diabetes?  To help prevent type 2 diabetes, take the following actions:  Be physically active. ? Do moderate-intensity physical activity for at least 30 minutes on at least 5 days of the week, or as much as told by your health care provider. This could be brisk walking, biking, or water aerobics. ? Ask your health care provider what activities are safe for you. A mix of physical activities may be best, such as walking, swimming, cycling, and strength training.  Lose weight as told by your health care provider. ? Losing 5-7% of your body  weight can reverse insulin resistance. ? Your health care provider can determine how much weight loss is best for you and can help you lose weight safely.  Follow a healthy meal plan. This includes eating lean proteins, complex carbohydrates, fresh fruits and vegetables, low-fat dairy products, and healthy fats. ? Follow instructions from your health care provider about eating or drinking restrictions. ? Make an appointment to see a diet and nutrition specialist (registered dietitian) to help you create a healthy eating plan that is right for you.  Do not smoke or use any tobacco products, such as cigarettes, chewing tobacco, and e-cigarettes. If you need help quitting, ask your health care provider.  Take over-the-counter and prescription medicines as told by your health care provider. You may be prescribed medicines that help lower the risk of type 2 diabetes.  This information is not intended to replace advice given to you by your health care provider. Make sure you discuss any questions you have with your health care provider. Document Released: 03/03/2016 Document Revised: 04/17/2016 Document Reviewed: 01/01/2016 Elsevier Interactive Patient Education  2018 [employer]  Preventing Type 2 Diabetes Mellitus Type 2 diabetes (type 2 diabetes mellitus) is a long-term (chronic) disease that affects blood sugar (glucose) levels. Normally, a hormone called insulin allows glucose to enter cells in the body. The cells use glucose for energy. In type 2 diabetes, one or both of these problems may be present:  The body does not make enough insulin.  The body does not respond properly to insulin that it makes (insulin resistance).  Insulin resistance or lack of insulin causes excess glucose to build up in the blood instead of going into cells. As a result, high blood glucose (hyperglycemia) develops, which can cause many complications. Being overweight or obese and having an inactive (sedentary)  lifestyle can increase your risk for diabetes. Type 2 diabetes can be delayed or prevented by making certain nutrition and lifestyle changes. What nutrition changes can be made?  Eat healthy meals and snacks regularly. Keep a healthy snack with you for when you get hungry between meals, such as fruit or a handful of nuts.  Eat lean meats and proteins that are low in saturated fats, such as chicken, fish, egg whites, and beans. Avoid processed meats.  Eat plenty of fruits and vegetables and plenty of grains that have not been processed (whole grains). It is recommended that you eat: ? 1?2 cups of fruit every day. ? 2?3 cups of vegetables every day. ? 6?8 oz of whole grains every day, such as oats, whole wheat, bulgur, brown rice, quinoa, and millet.  Eat low-fat dairy products, such as milk, yogurt, and cheese.  Eat foods that contain healthy fats, such as nuts, avocado, olive oil, and canola oil.  Drink water throughout the day. Avoid drinks that contain added sugar, such as soda or sweet tea.  Follow  instructions from your health care provider about specific eating or drinking restrictions.  Control how much food you eat at a time (portion size). ? Check food labels to find out the serving sizes of foods. ? Use a kitchen scale to weigh amounts of foods.  Saute or steam food instead of frying it. Cook with water or broth instead of oils or butter.  Limit your intake of: ? Salt (sodium). Have no more than 1 tsp (2,400 mg) of sodium a day. If you have heart disease or high blood pressure, have less than ? tsp (1,500 mg) of sodium a day. ? Saturated fat. This is fat that is solid at room temperature, such as butter or fat on meat. What lifestyle changes can be made?  Activity  Do moderate-intensity physical activity for at least 30 minutes on at least 5 days of the week, or as much as told by your health care provider.  Ask your health care provider what activities are safe for  you. A mix of physical activities may be best, such as walking, swimming, cycling, and strength training.  Try to add physical activity into your day. For example: ? Park in spots that are farther away than usual, so that you walk more. For example, park in a far corner of the parking lot when you go to the office or the grocery store. ? Take a walk during your lunch break. ? Use stairs instead of elevators or escalators. Weight Loss  Lose weight as directed. Your health care provider can determine how much weight loss is best for you and can help you lose weight safely.  If you are overweight or obese, you may be instructed to lose at least 5?7 % of your body weight. Alcohol and Tobacco   Limit alcohol intake to no more than 1 drink a day for nonpregnant women and 2 drinks a day for men. One drink equals 12 oz of beer, 5 oz of wine, or 1 oz of hard liquor.  Do not use any tobacco products, such as cigarettes, chewing tobacco, and e-cigarettes. If you need help quitting, ask your health care provider. Work With Your Health Care Provider  Have your blood glucose tested regularly, as told by your health care provider.  Discuss your risk factors and how you can reduce your risk for diabetes.  Get screening tests as told by your health care provider. You may have screening tests regularly, especially if you have certain risk factors for type 2 diabetes.  Make an appointment with a diet and nutrition specialist (registered dietitian). A registered dietitian can help you make a healthy eating plan and can help you understand portion sizes and food labels. Why are these changes important?  It is possible to prevent or delay type 2 diabetes and related health problems by making lifestyle and nutrition changes.  It can be difficult to recognize signs of type 2 diabetes. The best way to avoid possible damage to your body is to take actions to prevent the disease before you develop  symptoms. What can happen if changes are not made?  Your blood glucose levels may keep increasing. Having high blood glucose for a long time is dangerous. Too much glucose in your blood can damage your blood vessels, heart, kidneys, nerves, and eyes.  You may develop prediabetes or type 2 diabetes. Type 2 diabetes can lead to many chronic health problems and complications, such as: ? Heart disease. ? Stroke. ? Blindness. ? Kidney disease. ?  Depression. ? Poor circulation in the feet and legs, which could lead to surgical removal (amputation) in severe cases. Where to find support:  Ask your health care provider to recommend a registered dietitian, diabetes educator, or weight loss program.  Look for local or online weight loss groups.  Join a gym, fitness club, or outdoor activity group, such as a walking club. Where to find more information: To learn more about diabetes and diabetes prevention, visit:  American Diabetes Association (ADA): www.diabetes.[employer]  National Institute of Diabetes and Digestive and Kidney Diseases: ToyArticles.cawww.niddk.nih.gov/health-information/diabetes  To learn more about healthy eating, visit:  The U.S. Department of Agriculture Architect(USDA), Choose My Plate: http://yates.biz/www.choosemyplate.gov/food-groups  Office of Disease Prevention and Health Promotion (ODPHP), Dietary Guidelines: ListingMagazine.siwww.health.gov/dietaryguidelines  Summary  You can reduce your risk for type 2 diabetes by increasing your physical activity, eating healthy foods, and losing weight as directed.  Talk with your health care provider about your risk for type 2 diabetes. Ask about any blood tests or screening tests that you need to have. This information is not intended to replace advice given to you by your health care provider. Make sure you discuss any questions you have with your health care provider. Document Released: 03/03/2016 Document Revised: 04/17/2016 Document Reviewed: 01/01/2016 Elsevier Interactive  Patient Education  [employer]2018 Elsevier Inc.

## 2017-11-15 ENCOUNTER — Encounter: Payer: Self-pay | Admitting: Family Medicine

## 2018-05-03 ENCOUNTER — Ambulatory Visit: Payer: Self-pay | Admitting: Family Medicine

## 2018-05-03 DIAGNOSIS — R7303 Prediabetes: Secondary | ICD-10-CM | POA: Insufficient documentation

## 2018-05-10 ENCOUNTER — Encounter: Payer: Self-pay | Admitting: Emergency Medicine

## 2018-05-27 ENCOUNTER — Ambulatory Visit: Payer: Self-pay | Admitting: Nurse Practitioner

## 2018-05-27 VITALS — BP 130/75 | HR 84 | Temp 98.7°F | Resp 16 | Wt 271.6 lb

## 2018-05-27 DIAGNOSIS — R42 Dizziness and giddiness: Secondary | ICD-10-CM

## 2018-05-27 MED ORDER — MECLIZINE HCL 25 MG PO TABS: 25.0000 mg | ORAL_TABLET | Freq: Three times a day (TID) | ORAL | 0 refills | Status: AC | PRN

## 2018-05-27 NOTE — Patient Instructions (Signed)
Vertigo Vertigo is the feeling that you or your surroundings are moving when they are not. Vertigo can be dangerous if it occurs while you are doing something that could endanger you or others, such as driving. What are the causes? This condition is caused by a disturbance in the signals that are sent by your body's sensory systems to your brain. Different causes of a disturbance can lead to vertigo, including:  Infections, especially in the inner ear.  A bad reaction to a drug, or misuse of alcohol and medicines.  Withdrawal from drugs or alcohol.  Quickly changing positions, as when lying down or rolling over in bed.  Migraine headaches.  Decreased blood flow to the brain.  Decreased blood pressure.  Increased pressure in the brain from a head or neck injury, stroke, infection, tumor, or bleeding.  Central nervous system disorders.  What are the signs or symptoms? Symptoms of this condition usually occur when you move your head or your eyes in different directions. Symptoms may start suddenly, and they usually last for less than a minute. Symptoms may include:  Loss of balance and falling.  Feeling like you are spinning or moving.  Feeling like your surroundings are spinning or moving.  Nausea and vomiting.  Blurred vision or double vision.  Difficulty hearing.  Slurred speech.  Dizziness.  Involuntary eye movement (nystagmus).  Symptoms can be mild and cause only slight annoyance, or they can be severe and interfere with daily life. Episodes of vertigo may return (recur) over time, and they are often triggered by certain movements. Symptoms may improve over time. How is this diagnosed? This condition may be diagnosed based on medical history and the quality of your nystagmus. Your health care provider may test your eye movements by asking you to quickly change positions to trigger the nystagmus. This may be called the Dix-Hallpike test, head thrust test, or roll test.  You may be referred to a health care provider who specializes in ear, nose, and throat (ENT) problems (otolaryngologist) or a provider who specializes in disorders of the central nervous system (neurologist). You may have additional testing, including:  A physical exam.  Blood tests.  MRI.  A CT scan.  An electrocardiogram (ECG). This records electrical activity in your heart.  An electroencephalogram (EEG). This records electrical activity in your brain.  Hearing tests.  How is this treated? Treatment for this condition depends on the cause and the severity of the symptoms. Treatment options include:  Medicines to treat nausea or vertigo. These are usually used for severe cases. Some medicines that are used to treat other conditions may also reduce or eliminate vertigo symptoms. These include: ? Medicines that control allergies (antihistamines). ? Medicines that control seizures (anticonvulsants). ? Medicines that relieve depression (antidepressants). ? Medicines that relieve anxiety (sedatives).  Head movements to adjust your inner ear back to normal. If your vertigo is caused by an ear problem, your health care provider may recommend certain movements to correct the problem.  Surgery. This is rare.  Follow these instructions at home: Safety  Move slowly.Avoid sudden body or head movements.  Avoid driving.  Avoid operating heavy machinery.  Avoid doing any tasks that would cause danger to you or others if you would have a vertigo episode during the task.  If you have trouble walking or keeping your balance, try using a cane for stability. If you feel dizzy or unstable, sit down right away.  Return to your normal activities as told by your  health care provider. Ask your health care provider what activities are safe for you. General instructions  Take over-the-counter and prescription medicines only as told by your health care provider.  Avoid certain positions or  movements as told by your health care provider.  Drink enough fluid to keep your urine clear or pale yellow.  Keep all follow-up visits as told by your health care provider. This is important. Contact a health care provider if:  Your medicines do not relieve your vertigo or they make it worse.  You have a fever.  Your condition gets worse or you develop new symptoms.  Your family or friends notice any behavioral changes.  Your nausea or vomiting gets worse.  You have numbness or a "pins and needles" sensation in part of your body. Get help right away if:  You have difficulty moving or speaking.  You are always dizzy.  You faint.  You develop severe headaches.  You have weakness in your hands, arms, or legs.  You have changes in your hearing or vision.  You develop a stiff neck.  You develop sensitivity to light. This information is not intended to replace advice given to you by your health care provider. Make sure you discuss any questions you have with your health care provider. Document Released: 08/20/2005 Document Revised: 04/23/2016 Document Reviewed: 03/05/2015 Elsevier Interactive Patient Education  2018 [employer]. How to Perform the Epley Maneuver The Epley maneuver is an exercise that relieves symptoms of vertigo. Vertigo is the feeling that you or your surroundings are moving when they are not. When you feel vertigo, you may feel like the room is spinning and have trouble walking. Dizziness is a little different than vertigo. When you are dizzy, you may feel unsteady or light-headed. You can do this maneuver at home whenever you have symptoms of vertigo. You can do it up to 3 times a day until your symptoms go away. Even though the Epley maneuver may relieve your vertigo for a few weeks, it is possible that your symptoms will return. This maneuver relieves vertigo, but it does not relieve dizziness. What are the risks? If it is done correctly, the Epley  maneuver is considered safe. Sometimes it can lead to dizziness or nausea that goes away after a short time. If you develop other symptoms, such as changes in vision, weakness, or numbness, stop doing the maneuver and call your health care provider. How to perform the Epley maneuver 1. Sit on the edge of a bed or table with your back straight and your legs extended or hanging over the edge of the bed or table. 2. Turn your head halfway toward the affected ear or side. 3. Lie backward quickly with your head turned until you are lying flat on your back. You may want to position a pillow under your shoulders. 4. Hold this position for 30 seconds. You may experience an attack of vertigo. This is normal. 5. Turn your head to the opposite direction until your unaffected ear is facing the floor. 6. Hold this position for 30 seconds. You may experience an attack of vertigo. This is normal. Hold this position until the vertigo stops. 7. Turn your whole body to the same side as your head. Hold for another 30 seconds. 8. Sit back up. You can repeat this exercise up to 3 times a day. Follow these instructions at home:  After doing the Epley maneuver, you can return to your normal activities.  Ask your health care provider  if there is anything you should do at home to prevent vertigo. He or she may recommend that you: ? Keep your head raised (elevated) with two or more pillows while you sleep. ? Do not sleep on the side of your affected ear. ? Get up slowly from bed. ? Avoid sudden movements during the day. ? Avoid extreme head movement, like looking up or bending over. Contact a health care provider if:  Your vertigo gets worse.  You have other symptoms, including: ? Nausea. ? Vomiting. ? Headache. Get help right away if:  You have vision changes.  You have a severe or worsening headache or neck pain.  You cannot stop vomiting.  You have new numbness or weakness in any part of your  body. Summary  Vertigo is the feeling that you or your surroundings are moving when they are not.  The Epley maneuver is an exercise that relieves symptoms of vertigo.  If the Epley maneuver is done correctly, it is considered safe. You can do it up to 3 times a day. This information is not intended to replace advice given to you by your health care provider. Make sure you discuss any questions you have with your health care provider. Document Released: 11/15/2013 Document Revised: 09/30/2016 Document Reviewed: 09/30/2016 Elsevier Interactive Patient Education  2017 [employer]

## 2018-05-27 NOTE — Progress Notes (Signed)
Subjective:     Tanya CravenJessica Cheema is a 32 y.o. female who presents for evaluation of dizziness. The symptoms started 3 days ago and are ongoing. The attacks occur daily and last 3 hours. Positions that worsen symptoms: bending over, head back and turning head. Previous workup/treatments: none. Associated ear symptoms: none. Associated CNS symptoms: none. Recent infections: none. Head trauma: denied. Drug ingestion: none. Noise exposure: no occupational exposure. Family history: non-contributory.  Also informed that she is diabetic, states that when she is having these episodes she does check her blood sugar which are in the normal ranges.  Patient states her A1c is less than 6.  The following portions of the patient's history were reviewed and updated as appropriate: allergies, current medications and past medical history.  Review of Systems Constitutional: negative Eyes: negative Ears, nose, mouth, throat, and face: negative Respiratory: negative Cardiovascular: positive for palpitations Gastrointestinal: positive for nausea, negative for abdominal pain, change in bowel habits, constipation, diarrhea and vomiting Neurological: positive for dizziness, negative for headaches, memory problems, paresthesia, speech problems, tremors, vertigo and weakness    Objective:    BP 130/75 (BP Location: Right Arm, Patient Position: Sitting, Cuff Size: Large)   Pulse 84   Temp 98.7 F (37.1 C) (Oral)   Resp 16   Wt 271 lb 9.6 oz (123.2 kg)   SpO2 97%   BMI 45.20 kg/m  General appearance: alert, cooperative and no distress Head: Normocephalic, without obvious abnormality, atraumatic Eyes: conjunctivae/corneas clear. PERRL, EOM's intact. Fundi benign. Ears: normal TM's and external ear canals both ears Nose: Nares normal. Septum midline. Mucosa normal. No drainage or sinus tenderness. Throat: lips, mucosa, and tongue normal; teeth and gums normal Lungs: clear to auscultation bilaterally Heart: regular  rate and rhythm, S1, S2 normal, no murmur, click, rub or gallop Abdomen: soft, non-tender; bowel sounds normal; no masses,  no organomegaly Pulses: 2+ and symmetric Skin: Skin color, texture, turgor normal. No rashes or lesions Neurologic: Grossly normal      Assessment:    Vertigo    Plan:    Meclizine per medication orders. The nature of vertigo syndromes were discussed along with their usual course and treatment. Educational materials given and questions answered. Was also given handouts [facility], [facility], and [facility]. She is in agreement with this treatment plan.  Patient verbalizes understanding and has no questions at time of discharge. Meds ordered this encounter  Medications  . meclizine (ANTIVERT) 25 MG tablet    Sig: Take 1 tablet (25 mg total) by mouth 3 (three) times daily as needed for up to 10 days for dizziness.    Dispense:  30 tablet    Refill:  0    Order Specific Question:   Supervising Provider    Answer:   Stacie GlazeJENKINS, JOHN E [phone][[id number]]

## 2018-07-07 ENCOUNTER — Ambulatory Visit: Payer: Self-pay | Admitting: Emergency Medicine

## 2018-07-07 ENCOUNTER — Encounter: Payer: Self-pay | Admitting: Emergency Medicine

## 2018-07-07 ENCOUNTER — Other Ambulatory Visit: Payer: Self-pay

## 2018-07-07 VITALS — BP 112/72 | HR 103 | Temp 98.0°F | Resp 16 | Ht 65.55 in | Wt 273.0 lb

## 2018-07-07 DIAGNOSIS — R7303 Prediabetes: Secondary | ICD-10-CM

## 2018-07-07 MED ORDER — METFORMIN HCL 500 MG PO TABS: 500.0000 mg | ORAL_TABLET | Freq: Two times a day (BID) | ORAL | 3 refills | Status: DC

## 2018-07-07 NOTE — Progress Notes (Signed)
Tanya CravenJessica Dorsey 32 y.o.   Chief Complaint  Patient presents with  . Prediabetes    pt states she need refill on Metformin     HISTORY OF PRESENT ILLNESS: This is a [age] y.o. female with history of prediabetes here for follow-up.  Doing well needs a refill of metformin.  Asymptomatic.  Blood work done on 05/10/2018 shows normal electrolytes, normal kidney function, normal glucose and hemoglobin A1c, normal proteins, normal liver enzymes. Sees a psychiatrist regularly who is prescribing her antidepressant.  No complaints today.  HPI   Prior to Admission medications   Medication Sig Start Date End Date Taking? Authorizing Provider  amphetamine-dextroamphetamine (ADDERALL) 10 MG tablet Take 10 mg by mouth daily with breakfast.   Yes [provider]  buPROPion (WELLBUTRIN XL) 300 MG 24 hr tablet Take 300 mg by mouth daily.   Yes [provider]  metFORMIN (GLUCOPHAGE) 500 MG tablet Take 1 tablet (500 mg total) by mouth 2 (two) times daily with a meal. 11/09/17  Yes Sherren MochaShaw, Eva N, MD  SPRINTEC 28 0.25-35 MG-MCG tablet TK 1 T PO QD 05/24/18  Yes [provider]    No Known Allergies  Patient Active Problem List   Diagnosis Date Noted  . Prediabetes 05/03/2018  . Depression 07/29/2014  . ADD (attention deficit disorder) 07/29/2014  . Severe obesity (BMI >= 40) (HCC) 07/29/2014  . Plantar fasciitis 07/29/2014  . Anxiety state 07/29/2014    Past Medical History:  Diagnosis Date  . Cholestasis of pregnancy   . Depression   . GDM, class A2 07/29/2014  . Gestational diabetes mellitus, antepartum   . Hypertension     Past Surgical History:  Procedure Laterality Date  . CESAREAN SECTION N/A 08/02/2014   Procedure: CESAREAN SECTION;  Surgeon: Esmeralda ArthurSandra A Rivard, MD;  Location: [facility];  Service: Obstetrics;  Laterality: N/A;  . CHOLECYSTECTOMY    . WISDOM TOOTH EXTRACTION      Social History   Socioeconomic History  . Marital status: Married    Spouse name: Not on  file  . Number of children: 1  . Years of education: Not on file  . Highest education level: Not on file  Occupational History  . Occupation: therapist  Social Needs  . Financial resource strain: Not on file  . Food insecurity:    Worry: Not on file    Inability: Not on file  . Transportation needs:    Medical: Not on file    Non-medical: Not on file  Tobacco Use  . Smoking status: Never Smoker  . Smokeless tobacco: Never Used  Substance and Sexual Activity  . Alcohol use: No  . Drug use: No  . Sexual activity: Yes    Birth control/protection: Pill  Lifestyle  . Physical activity:    Days per week: Not on file    Minutes per session: Not on file  . Stress: Not on file  Relationships  . Social connections:    Talks on phone: Not on file    Gets together: Not on file    Attends religious service: Not on file    Active member of club or organization: Not on file    Attends meetings of clubs or organizations: Not on file    Relationship status: Not on file  . Intimate partner violence:    Fear of current or ex partner: Not on file    Emotionally abused: Not on file    Physically abused: Not on file  Forced sexual activity: Not on file  Other Topics Concern  . Not on file  Social History Narrative  . Not on file    Family History  Problem Relation Age of Onset  . Diabetes Mother   . Hypertension Mother   . Hypertension Father      Review of Systems  Constitutional: Negative.  Negative for chills and fever.  HENT: Negative.  Negative for ear discharge, nosebleeds and sore throat.   Eyes: Negative.  Negative for blurred vision and double vision.  Respiratory: Negative.  Negative for cough and shortness of breath.   Cardiovascular: Negative.  Negative for chest pain and palpitations.  Gastrointestinal: Negative for abdominal pain, diarrhea, nausea and vomiting.  Genitourinary: Negative.  Negative for dysuria.  Musculoskeletal: Negative.  Negative for back pain  and neck pain.  Skin: Negative.  Negative for rash.  Neurological: Negative.  Negative for dizziness and headaches.  All other systems reviewed and are negative.   Vitals:   07/07/18 1345  BP: 112/72  Pulse: (!) 103  Resp: 16  Temp: 98 F (36.7 C)  SpO2: 97%    Physical Exam  Constitutional: She is oriented to person, place, and time. She appears well-developed and well-nourished.  HENT:  Head: Normocephalic and atraumatic.  Mouth/Throat: Oropharynx is clear and moist.  Eyes: Pupils are equal, round, and reactive to light. EOM are normal.  Neck: Normal range of motion.  Cardiovascular: Normal rate and regular rhythm.  Pulmonary/Chest: Effort normal.  Musculoskeletal: Normal range of motion.  Neurological: She is alert and oriented to person, place, and time. No sensory deficit. She exhibits normal muscle tone.  Skin: Skin is warm and dry. Capillary refill takes less than 2 seconds.  Psychiatric: She has a normal mood and affect. Her behavior is normal.  Vitals reviewed.    ASSESSMENT & PLAN: [name] was seen today for prediabetes.  Diagnoses and all orders for this visit:  Prediabetes -     metFORMIN (GLUCOPHAGE) 500 MG tablet; Take 1 tablet (500 mg total) by mouth 2 (two) times daily with a meal.    Patient Instructions       I will contact you with your lab results within the next 2 weeks.  If you have not heard from [place] then please contact us. The fastest way to get your results is to register for My Chart.   IF you received an x-ray today, you will receive an invoice from [facility]. Please contact [facility] at [phone] with questions or concerns regarding your invoice.   IF you received labwork today, you will receive an invoice from [facility]. Please contact [facility] at [phone] with questions or concerns regarding your invoice.   Our billing staff will not be able to assist you with questions regarding bills from these  companies.  You will be contacted with the lab results as soon as they are available. The fastest way to get your results is to activate your My Chart account. Instructions are located on the last page of this paperwork. If you have not heard from [place] regarding the results in 2 weeks, please contact this office.      Diabetes Mellitus and Nutrition When you have diabetes (diabetes mellitus), it is very important to have healthy eating habits because your blood sugar (glucose) levels are greatly affected by what you eat and drink. Eating healthy foods in the appropriate amounts, at about the same times every day, can help you:  Control your blood glucose.  Lower your risk of heart disease.  Improve your blood pressure.  Reach or maintain a healthy weight.  Every person with diabetes is different, and each person has different needs for a meal plan. Your health care provider may recommend that you work with a diet and nutrition specialist (dietitian) to make a meal plan that is best for you. Your meal plan may vary depending on factors such as:  The calories you need.  The medicines you take.  Your weight.  Your blood glucose, blood pressure, and cholesterol levels.  Your activity level.  Other health conditions you have, such as heart or kidney disease.  How do carbohydrates affect me? Carbohydrates affect your blood glucose level more than any other type of food. Eating carbohydrates naturally increases the amount of glucose in your blood. Carbohydrate counting is a method for keeping track of how many carbohydrates you eat. Counting carbohydrates is important to keep your blood glucose at a healthy level, especially if you use insulin or take certain oral diabetes medicines. It is important to know how many carbohydrates you can safely have in each meal. This is different for every person. Your dietitian can help you calculate how many carbohydrates you should have at each meal and  for snack. Foods that contain carbohydrates include:  Bread, cereal, rice, pasta, and crackers.  Potatoes and corn.  Peas, beans, and lentils.  Milk and yogurt.  Fruit and juice.  Desserts, such as cakes, cookies, ice cream, and candy.  How does alcohol affect me? Alcohol can cause a sudden decrease in blood glucose (hypoglycemia), especially if you use insulin or take certain oral diabetes medicines. Hypoglycemia can be a life-threatening condition. Symptoms of hypoglycemia (sleepiness, dizziness, and confusion) are similar to symptoms of having too much alcohol. If your health care provider says that alcohol is safe for you, follow these guidelines:  Limit alcohol intake to no more than 1 drink per day for nonpregnant women and 2 drinks per day for men. One drink equals 12 oz of beer, 5 oz of wine, or 1 oz of hard liquor.  Do not drink on an empty stomach.  Keep yourself hydrated with water, diet soda, or unsweetened iced tea.  Keep in mind that regular soda, juice, and other mixers may contain a lot of sugar and must be counted as carbohydrates.  What are tips for following this plan? Reading food labels  Start by checking the serving size on the label. The amount of calories, carbohydrates, fats, and other nutrients listed on the label are based on one serving of the food. Many foods contain more than one serving per package.  Check the total grams (g) of carbohydrates in one serving. You can calculate the number of servings of carbohydrates in one serving by dividing the total carbohydrates by 15. For example, if a food has 30 g of total carbohydrates, it would be equal to 2 servings of carbohydrates.  Check the number of grams (g) of saturated and trans fats in one serving. Choose foods that have low or no amount of these fats.  Check the number of milligrams (mg) of sodium in one serving. Most people should limit total sodium intake to less than 2,300 mg per day.  Always  check the nutrition information of foods labeled as "low-fat" or "nonfat". These foods may be higher in added sugar or refined carbohydrates and should be avoided.  Talk to your dietitian to identify your daily goals for nutrients listed on the label.  Shopping  Avoid buying canned, premade, or processed foods. These foods tend to be high in fat, sodium, and added sugar.  Shop around the outside edge of the grocery store. This includes fresh fruits and vegetables, bulk grains, fresh meats, and fresh dairy. Cooking  Use low-heat cooking methods, such as baking, instead of high-heat cooking methods like deep frying.  Cook using healthy oils, such as olive, canola, or sunflower oil.  Avoid cooking with butter, cream, or high-fat meats. Meal planning  Eat meals and snacks regularly, preferably at the same times every day. Avoid going long periods of time without eating.  Eat foods high in fiber, such as fresh fruits, vegetables, beans, and whole grains. Talk to your dietitian about how many servings of carbohydrates you can eat at each meal.  Eat 4-6 ounces of lean protein each day, such as lean meat, chicken, fish, eggs, or tofu. 1 ounce is equal to 1 ounce of meat, chicken, or fish, 1 egg, or 1/4 cup of tofu.  Eat some foods each day that contain healthy fats, such as avocado, nuts, seeds, and fish. Lifestyle   Check your blood glucose regularly.  Exercise at least 30 minutes 5 or more days each week, or as told by your health care provider.  Take medicines as told by your health care provider.  Do not use any products that contain nicotine or tobacco, such as cigarettes and e-cigarettes. If you need help quitting, ask your health care provider.  Work with a Veterinary surgeon or diabetes educator to identify strategies to manage stress and any emotional and social challenges. What are some questions to ask my health care provider?  Do I need to meet with a diabetes educator?  Do I need  to meet with a dietitian?  What number can I call if I have questions?  When are the best times to check my blood glucose? Where to find more information:  American Diabetes Association: diabetes.org/food-and-fitness/food  Academy of Nutrition and Dietetics: https://www.vargas.com/  [employer] of Diabetes and Digestive and Kidney Diseases (NIH): FindJewelers.cz Summary  A healthy meal plan will help you control your blood glucose and maintain a healthy lifestyle.  Working with a diet and nutrition specialist (dietitian) can help you make a meal plan that is best for you.  Keep in mind that carbohydrates and alcohol have immediate effects on your blood glucose levels. It is important to count carbohydrates and to use alcohol carefully. This information is not intended to replace advice given to you by your health care provider. Make sure you discuss any questions you have with your health care provider. Document Released: 08/07/2005 Document Revised: 12/15/2016 Document Reviewed: 12/15/2016 Elsevier Interactive Patient Education  2018 [employer].      Edwina Barth, MD [facility]

## 2018-07-07 NOTE — Patient Instructions (Addendum)
I will contact you with your lab results within the next 2 weeks.  If you have not heard from us then please contact us. The fastest way to get your results is to register for My Chart.   IF you received an x-ray today, you will receive an invoice from [facility]. Please contact [facility] at [phone] with questions or concerns regarding your invoice.   IF you received labwork today, you will receive an invoice from [facility]. Please contact [facility] at [phone] with questions or concerns regarding your invoice.   Our billing staff will not be able to assist you with questions regarding bills from these companies.  You will be contacted with the lab results as soon as they are available. The fastest way to get your results is to activate your My Chart account. Instructions are located on the last page of this paperwork. If you have not heard from us regarding the results in 2 weeks, please contact this office.      Diabetes Mellitus and Nutrition When you have diabetes (diabetes mellitus), it is very important to have healthy eating habits because your blood sugar (glucose) levels are greatly affected by what you eat and drink. Eating healthy foods in the appropriate amounts, at about the same times every day, can help you:  Control your blood glucose.  Lower your risk of heart disease.  Improve your blood pressure.  Reach or maintain a healthy weight.  Every person with diabetes is different, and each person has different needs for a meal plan. Your health care provider may recommend that you work with a diet and nutrition specialist (dietitian) to make a meal plan that is best for you. Your meal plan may vary depending on factors such as:  The calories you need.  The medicines you take.  Your weight.  Your blood glucose, blood pressure, and cholesterol levels.  Your activity level.  Other health conditions you have, such as heart or kidney  disease.  How do carbohydrates affect me? Carbohydrates affect your blood glucose level more than any other type of food. Eating carbohydrates naturally increases the amount of glucose in your blood. Carbohydrate counting is a method for keeping track of how many carbohydrates you eat. Counting carbohydrates is important to keep your blood glucose at a healthy level, especially if you use insulin or take certain oral diabetes medicines. It is important to know how many carbohydrates you can safely have in each meal. This is different for every person. Your dietitian can help you calculate how many carbohydrates you should have at each meal and for snack. Foods that contain carbohydrates include:  Bread, cereal, rice, pasta, and crackers.  Potatoes and corn.  Peas, beans, and lentils.  Milk and yogurt.  Fruit and juice.  Desserts, such as cakes, cookies, ice cream, and candy.  How does alcohol affect me? Alcohol can cause a sudden decrease in blood glucose (hypoglycemia), especially if you use insulin or take certain oral diabetes medicines. Hypoglycemia can be a life-threatening condition. Symptoms of hypoglycemia (sleepiness, dizziness, and confusion) are similar to symptoms of having too much alcohol. If your health care provider says that alcohol is safe for you, follow these guidelines:  Limit alcohol intake to no more than 1 drink per day for nonpregnant women and 2 drinks per day for men. One drink equals 12 oz of beer, 5 oz of wine, or 1 oz of hard liquor.  Do not drink on an empty stomach.  Keep yourself hydrated with water, diet soda, or unsweetened iced tea.  Keep in mind that regular soda, juice, and other mixers may contain a lot of sugar and must be counted as carbohydrates.  What are tips for following this plan? Reading food labels  Start by checking the serving size on the label. The amount of calories, carbohydrates, fats, and other nutrients listed on the label  are based on one serving of the food. Many foods contain more than one serving per package.  Check the total grams (g) of carbohydrates in one serving. You can calculate the number of servings of carbohydrates in one serving by dividing the total carbohydrates by 15. For example, if a food has 30 g of total carbohydrates, it would be equal to 2 servings of carbohydrates.  Check the number of grams (g) of saturated and trans fats in one serving. Choose foods that have low or no amount of these fats.  Check the number of milligrams (mg) of sodium in one serving. Most people should limit total sodium intake to less than 2,300 mg per day.  Always check the nutrition information of foods labeled as "low-fat" or "nonfat". These foods may be higher in added sugar or refined carbohydrates and should be avoided.  Talk to your dietitian to identify your daily goals for nutrients listed on the label. Shopping  Avoid buying canned, premade, or processed foods. These foods tend to be high in fat, sodium, and added sugar.  Shop around the outside edge of the grocery store. This includes fresh fruits and vegetables, bulk grains, fresh meats, and fresh dairy. Cooking  Use low-heat cooking methods, such as baking, instead of high-heat cooking methods like deep frying.  Cook using healthy oils, such as olive, canola, or sunflower oil.  Avoid cooking with butter, cream, or high-fat meats. Meal planning  Eat meals and snacks regularly, preferably at the same times every day. Avoid going long periods of time without eating.  Eat foods high in fiber, such as fresh fruits, vegetables, beans, and whole grains. Talk to your dietitian about how many servings of carbohydrates you can eat at each meal.  Eat 4-6 ounces of lean protein each day, such as lean meat, chicken, fish, eggs, or tofu. 1 ounce is equal to 1 ounce of meat, chicken, or fish, 1 egg, or 1/4 cup of tofu.  Eat some foods each day that contain  healthy fats, such as avocado, nuts, seeds, and fish. Lifestyle   Check your blood glucose regularly.  Exercise at least 30 minutes 5 or more days each week, or as told by your health care provider.  Take medicines as told by your health care provider.  Do not use any products that contain nicotine or tobacco, such as cigarettes and e-cigarettes. If you need help quitting, ask your health care provider.  Work with a Veterinary surgeoncounselor or diabetes educator to identify strategies to manage stress and any emotional and social challenges. What are some questions to ask my health care provider?  Do I need to meet with a diabetes educator?  Do I need to meet with a dietitian?  What number can I call if I have questions?  When are the best times to check my blood glucose? Where to find more information:  American Diabetes Association: diabetes.org/food-and-fitness/food  Academy of Nutrition and Dietetics: https://www.vargas.com/www.eatright.org/resources/health/diseases-and-conditions/diabetes  [employer] Institute of Diabetes and Digestive and Kidney Diseases (NIH): FindJewelers.czwww.niddk.nih.gov/health-information/diabetes/overview/diet-eating-physical-activity Summary  A healthy meal plan will help you control your blood glucose and  maintain a healthy lifestyle.  Working with a diet and nutrition specialist (dietitian) can help you make a meal plan that is best for you.  Keep in mind that carbohydrates and alcohol have immediate effects on your blood glucose levels. It is important to count carbohydrates and to use alcohol carefully. This information is not intended to replace advice given to you by your health care provider. Make sure you discuss any questions you have with your health care provider. Document Released: 08/07/2005 Document Revised: 12/15/2016 Document Reviewed: 12/15/2016 Elsevier Interactive Patient Education  [employer].

## 2018-11-27 ENCOUNTER — Ambulatory Visit: Payer: Self-pay | Admitting: Nurse Practitioner

## 2018-11-27 ENCOUNTER — Encounter: Payer: Self-pay | Admitting: Nurse Practitioner

## 2018-11-27 VITALS — BP 112/74 | HR 89 | Temp 98.9°F | Resp 12 | Wt 279.2 lb

## 2018-11-27 DIAGNOSIS — H6982 Other specified disorders of Eustachian tube, left ear: Secondary | ICD-10-CM

## 2018-11-27 MED ORDER — CETIRIZINE HCL 10 MG PO TABS: 10.0000 mg | ORAL_TABLET | Freq: Every day | ORAL | 0 refills | Status: DC

## 2018-11-27 MED ORDER — PREDNISONE 10 MG (21) PO TBPK: ORAL_TABLET | ORAL | 0 refills | Status: AC

## 2018-11-27 NOTE — Patient Instructions (Signed)
Eustachian Tube Dysfunction -Take medications as prescribed. -May apply warm compresses to the left ear for pain or discomfort. -Take Zyrtec 10 mg at bedtime until symptoms improve. -May use ibuprofen or Tylenol for pain, fever, or general discomfort. -If symptoms do not improve within the next 7 to 10 days, or there is a change in symptoms to can include hearing loss, follow-up with your PCP or ENT as soon as possible.   Eustachian tube dysfunction refers to a condition in which a blockage develops in the narrow passage that connects the middle ear to the back of the nose (eustachian tube). The eustachian tube regulates air pressure in the middle ear by letting air move between the ear and nose. It also helps to drain fluid from the middle ear space. Eustachian tube dysfunction can affect one or both ears. When the eustachian tube does not function properly, air pressure, fluid, or both can build up in the middle ear. What are the causes? This condition occurs when the eustachian tube becomes blocked or cannot open normally. Common causes of this condition include:  Ear infections.  Colds and other infections that affect the nose, mouth, and throat (upper respiratory tract).  Allergies.  Irritation from cigarette smoke.  Irritation from stomach acid coming up into the esophagus (gastroesophageal reflux). The esophagus is the tube that carries food from the mouth to the stomach.  Sudden changes in air pressure, such as from descending in an airplane or scuba diving.  Abnormal growths in the nose or throat, such as: ? Growths that line the nose (nasal polyps). ? Abnormal growth of cells (tumors). ? Enlarged tissue at the back of the throat (adenoids). What increases the risk? You are more likely to develop this condition if:  You smoke.  You are overweight.  You are a child who has: ? Certain birth defects of the mouth, such as cleft palate. ? Large tonsils or adenoids. What are  the signs or symptoms? Common symptoms of this condition include:  A feeling of fullness in the ear.  Ear pain.  Clicking or popping noises in the ear.  Ringing in the ear.  Hearing loss.  Loss of balance.  Dizziness. Symptoms may get worse when the air pressure around you changes, such as when you travel to an area of high elevation, fly on an airplane, or go scuba diving. How is this diagnosed? This condition may be diagnosed based on:  Your symptoms.  A physical exam of your ears, nose, and throat.  Tests, such as those that measure: ? The movement of your eardrum (tympanogram). ? Your hearing (audiometry). How is this treated? Treatment depends on the cause and severity of your condition.  In mild cases, you may relieve your symptoms by moving air into your ears. This is called "popping the ears."  In more severe cases, or if you have symptoms of fluid in your ears, treatment may include: ? Medicines to relieve congestion (decongestants). ? Medicines that treat allergies (antihistamines). ? Nasal sprays or ear drops that contain medicines that reduce swelling (steroids). ? A procedure to drain the fluid in your eardrum (myringotomy). In this procedure, a small tube is placed in the eardrum to:  Drain the fluid.  Restore the air in the middle ear space. ? A procedure to insert a balloon device through the nose to inflate the opening of the eustachian tube (balloon dilation). Follow these instructions at home: Lifestyle  Do not do any of the following until your health  care provider approves: ? Travel to high altitudes. ? Fly in airplanes. ? Work in a Estate agent or room. ? Scuba dive.  Do not use any products that contain nicotine or tobacco, such as cigarettes and e-cigarettes. If you need help quitting, ask your health care provider.  Keep your ears dry. Wear fitted earplugs during showering and bathing. Dry your ears completely after. General  instructions  Take over-the-counter and prescription medicines only as told by your health care provider.  Use techniques to help pop your ears as recommended by your health care provider. These may include: ? Chewing gum. ? Yawning. ? Frequent, forceful swallowing. ? Closing your mouth, holding your nose closed, and gently blowing as if you are trying to blow air out of your nose.  Keep all follow-up visits as told by your health care provider. This is important. Contact a health care provider if:  Your symptoms do not go away after treatment.  Your symptoms come back after treatment.  You are unable to pop your ears.  You have: ? A fever. ? Pain in your ear. ? Pain in your head or neck. ? Fluid draining from your ear.  Your hearing suddenly changes.  You become very dizzy.  You lose your balance. Summary  Eustachian tube dysfunction refers to a condition in which a blockage develops in the eustachian tube.  It can be caused by ear infections, allergies, inhaled irritants, or abnormal growths in the nose or throat.  Symptoms include ear pain, hearing loss, or ringing in the ears.  Mild cases are treated with maneuvers to unblock the ears, such as yawning or ear popping.  Severe cases are treated with medicines. Surgery may also be done (rare). This information is not intended to replace advice given to you by your health care provider. Make sure you discuss any questions you have with your health care provider. Document Released: 12/07/2015 Document Revised: 03/02/2018 Document Reviewed: 03/02/2018 Elsevier Interactive Patient Education  2019 [employer].

## 2018-11-27 NOTE — Progress Notes (Signed)
Subjective:    Patient ID: Tanya CravenJessica Klutts, female    DOB: October 29, 1986, 33 y.o.   MRN: [id number]  The patient is a 33 year old female who presents today for complaints of left ear pain.  Patient states she has had a cold for the last 3 to 4 weeks but over the last week or so the pain in her left ear has worsened.  Patient describes pain as aching and shooting.  Patient also complains of nasal congestion and headache.  Patient does have a history of seasonal allergies.  Patient states she has been taking Claritin D, Advil and Flonase with little relief.  Otalgia   There is pain in the left ear. This is a new problem. The current episode started 1 to 4 weeks ago. The problem occurs constantly. The problem has been unchanged. There has been no fever. The pain is moderate. Associated symptoms include headaches. Pertinent negatives include no coughing, ear discharge, hearing loss, rhinorrhea or sore throat. Treatments tried: Claritin, Flonase, and Advil. The treatment provided mild relief. There is no history of a chronic ear infection or a tympanostomy tube.   I have reviewed the patient's past medical history, current medications, and allergies.   Review of Systems  Constitutional: Negative.   HENT: Positive for ear pain. Negative for ear discharge, hearing loss, postnasal drip, rhinorrhea, sinus pressure, sinus pain and sore throat.   Eyes: Negative.   Respiratory: Negative for cough.   Cardiovascular: Negative.   Gastrointestinal: Negative.   Skin: Negative.   Neurological: Positive for headaches. Negative for dizziness, speech difficulty, weakness and light-headedness.       Objective:   Physical Exam Constitutional:      General: She is not in acute distress.    Appearance: Normal appearance. She is not ill-appearing.  HENT:     Head: Normocephalic and atraumatic.     Right Ear: Ear canal normal.     Left Ear: Ear canal normal.     Ears:     Comments: Bilateral mucoid middle ear  fluid    Nose: Congestion (mild) present. No rhinorrhea.     Mouth/Throat:     Mouth: Mucous membranes are moist.  Eyes:     Pupils: Pupils are equal, round, and reactive to light.  Neck:     Musculoskeletal: Normal range of motion and neck supple.  Cardiovascular:     Rate and Rhythm: Normal rate and regular rhythm.     Pulses: Normal pulses.     Heart sounds: Normal heart sounds.  Pulmonary:     Effort: Pulmonary effort is normal.     Breath sounds: Normal breath sounds.  Abdominal:     General: Abdomen is flat.     Tenderness: There is no abdominal tenderness.  Skin:    General: Skin is warm and dry.     Capillary Refill: Capillary refill takes less than 2 seconds.  Neurological:     General: No focal deficit present.     Mental Status: She is alert and oriented to person, place, and time.     Cranial Nerves: No cranial nerve deficit.  Psychiatric:        Mood and Affect: Mood normal.        Thought Content: Thought content normal.       Assessment & Plan:   Exam findings, diagnosis etiology and medication use and indications reviewed with patient. Follow- Up and discharge instructions provided. No emergent/urgent issues found on exam.  Based on the patient's  clinical presentation, symptoms, and physical assessment, patient has a eustachian tube dysfunction.  Will prescribe oral steroids as patient has been using antihistamines along with a nasal steroid with no relief.  Informed patient that if symptoms do not improve after this treatment regimen, she will need to follow-up with her PCP or ENT.  Patient was in agreement with this treatment plan.  Patient education was provided. Patient verbalized understanding of information provided and agrees with plan of care (POC), all questions answered. The patient is advised to call or return to clinic if condition does not see an improvement in symptoms, or to seek the care of the closest emergency department if condition worsens with the  above plan.   1. Acute dysfunction of left eustachian tube  - predniSONE (STERAPRED UNI-PAK 21 TAB) 10 MG (21) TBPK tablet; Take as directed.  Dispense: 21 tablet; Refill: 0 -Take medications as prescribed. -May apply warm compresses to the left ear for pain or discomfort. -Take Zyrtec 10 mg at bedtime until symptoms improve. -May use ibuprofen or Tylenol for pain, fever, or general discomfort. -If symptoms do not improve within the next 7 to 10 days, or there is a change in symptoms to can include hearing loss, follow-up with your PCP or ENT as soon as possible.

## 2019-05-02 ENCOUNTER — Other Ambulatory Visit: Payer: Self-pay | Admitting: Physician Assistant

## 2019-05-02 DIAGNOSIS — N6320 Unspecified lump in the left breast, unspecified quadrant: Secondary | ICD-10-CM

## 2019-05-05 ENCOUNTER — Other Ambulatory Visit: Payer: Self-pay | Admitting: Physician Assistant

## 2019-05-05 DIAGNOSIS — N6320 Unspecified lump in the left breast, unspecified quadrant: Secondary | ICD-10-CM

## 2019-05-06 ENCOUNTER — Other Ambulatory Visit: Payer: Self-pay

## 2019-05-06 ENCOUNTER — Ambulatory Visit
Admission: RE | Admit: 2019-05-06 | Discharge: 2019-05-06 | Disposition: A | Discharge status: Home / Self Care | Payer: 59 | Source: Ambulatory Visit | Attending: Physician Assistant | Admitting: Physician Assistant

## 2019-05-06 DIAGNOSIS — N6320 Unspecified lump in the left breast, unspecified quadrant: Secondary | ICD-10-CM

## 2019-05-09 ENCOUNTER — Other Ambulatory Visit: Payer: Self-pay

## 2019-05-10 ENCOUNTER — Other Ambulatory Visit: Payer: Self-pay

## 2019-05-13 ENCOUNTER — Other Ambulatory Visit: Payer: Self-pay

## 2019-11-10 ENCOUNTER — Encounter: Payer: Self-pay | Admitting: Neurology

## 2019-11-10 ENCOUNTER — Ambulatory Visit (INDEPENDENT_AMBULATORY_CARE_PROVIDER_SITE_OTHER): Payer: 59 | Admitting: Neurology

## 2019-11-10 ENCOUNTER — Other Ambulatory Visit: Payer: Self-pay

## 2019-11-10 VITALS — BP 155/96 | HR 101 | Ht 64.0 in | Wt 281.0 lb

## 2019-11-10 DIAGNOSIS — G478 Other sleep disorders: Secondary | ICD-10-CM

## 2019-11-10 DIAGNOSIS — R0683 Snoring: Secondary | ICD-10-CM

## 2019-11-10 DIAGNOSIS — Z6841 Body Mass Index (BMI) 40.0 and over, adult: Secondary | ICD-10-CM

## 2019-11-10 DIAGNOSIS — G4719 Other hypersomnia: Secondary | ICD-10-CM | POA: Diagnosis not present

## 2019-11-10 DIAGNOSIS — G2581 Restless legs syndrome: Secondary | ICD-10-CM

## 2019-11-10 NOTE — Patient Instructions (Signed)

## 2019-11-10 NOTE — Progress Notes (Signed)
Subjective:    Patient ID: Tanya Dorsey is a 33 y.o. female.  HPI     Star Age, MD, PhD [facility] [address] [place], [place] [address]  Dear Gari Crown,   I saw your patient, Tanya Dorsey, for new kind request in my sleep clinic today for initial consultation of her sleep disorder, in particular, her daytime somnolence.  The patient is unaccompanied today.  As you know, Tanya Dorsey is a 33 year old right-handed woman with an underlying medical history of ADHD, acid reflux, migraines, prediabetes and morbid obesity with a BMI of over 45, who reports a 1 year history of daytime somnolence.  She feels like she can take a nap despite taking her Adderall.  She takes Adderall XR generic, 10 mg once daily.  She is also on Cymbalta and on Ativan as needed.  She takes the Ativan 0.5 mg strength maybe once a week and is on Cymbalta 20 mg daily.  She reports mild snoring, intermittent and not disturbing to her husband.  She lives with her husband and 36-year-old daughter.  Patient tries to be in bed around 10 and rise time is around 7 or 8.  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  She has no family history of sleep apnea.  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  She has experienced intermittent restless leg symptoms for years, typically off and on.  She has no night to night nocturia or morning headaches.  She drinks caffeine and limitation, 1 cup of coffee per day, rare alcohol, no smoking. I reviewed your virtual visit note from 10/25/2019.  Her Epworth sleepiness score is 10 out of 24, fatigue severity score is 37 out of 63.  She had a tonsillectomy in elementary school.   Her Past Medical History Is Significant For: Past Medical History:  Diagnosis Date  . Cholestasis of pregnancy   . Depression   . GDM, class A2 07/29/2014  . Gestational  diabetes mellitus, antepartum   . Hypertension     Her Past Surgical History Is Significant For: Past Surgical History:  Procedure Laterality Date  . CESAREAN SECTION N/A 08/02/2014   Procedure: CESAREAN SECTION;  Surgeon: Alwyn Pea, MD;  Location: [facility];  Service: Obstetrics;  Laterality: N/A;  . CHOLECYSTECTOMY    . WISDOM TOOTH EXTRACTION      Her Family History Is Significant For: Family History  Problem Relation Age of Onset  . Diabetes Mother   . Hypertension Mother   . Hypertension Father     Her Social History Is Significant For: Social History   Socioeconomic History  . Marital status: Married    Spouse name: Not on file  . Number of children: 1  . Years of education: Not on file  . Highest education level: Not on file  Occupational History  . Occupation: therapist  Tobacco Use  . Smoking status: Never Smoker  . Smokeless tobacco: Never Used  Substance and Sexual Activity  . Alcohol use: No  . Drug use: No  . Sexual activity: Yes    Birth control/protection: Pill  Other Topics Concern  . Not on file  Social History Narrative  . Not on file   Social Determinants of Health   Financial Resource Strain:   . Difficulty of Paying Living Expenses: Not on file  Food Insecurity:   . Worried About  Running Out of Food in the Last Year: Not on file  . Ran Out of Food in the Last Year: Not on file  Transportation Needs:   . Lack of Transportation (Medical): Not on file  . Lack of Transportation (Non-Medical): Not on file  Physical Activity:   . Days of Exercise per Week: Not on file  . Minutes of Exercise per Session: Not on file  Stress:   . Feeling of Stress : Not on file  Social Connections:   . Frequency of Communication with Friends and Family: Not on file  . Frequency of Social Gatherings with Friends and Family: Not on file  . Attends Religious Services: Not on file  . Active Member of Clubs or Organizations: Not on file  . Attends Tax inspector Meetings: Not on file  . Marital Status: Not on file    Her Allergies Are:  No Known Allergies:   Her Current Medications Are:  Outpatient Encounter Medications as of 11/10/2019  Medication Sig  . amphetamine-dextroamphetamine (ADDERALL) 10 MG tablet Take 10 mg by mouth daily with breakfast.  . DULoxetine (CYMBALTA) 20 MG capsule Take 20 mg by mouth daily.  . SPRINTEC 28 0.25-35 MG-MCG tablet TK 1 T PO QD  . cetirizine (ZYRTEC) 10 MG tablet Take 1 tablet (10 mg total) by mouth daily.  . metFORMIN (GLUCOPHAGE) 500 MG tablet Take 1 tablet (500 mg total) by mouth 2 (two) times daily with a meal.  . [DISCONTINUED] buPROPion (WELLBUTRIN XL) 300 MG 24 hr tablet Take 300 mg by mouth daily.   No facility-administered encounter medications on file as of 11/10/2019.  :  Review of Systems:  Out of a complete 14 point review of systems, all are reviewed and negative with the exception of these symptoms as listed below: Review of Systems  Neurological:       Pt presents today to discuss her sleep. Pt has never had a sleep study. She does not know if she snores.  Epworth Sleepiness Scale 0= would never doze 1= slight chance of dozing 2= moderate chance of dozing 3= high chance of dozing  Sitting and reading: 2 Watching TV: 1 Sitting inactive in a public place (ex. Theater or meeting):0 As a passenger in a car for an hour without a break: 3 Lying down to rest in the afternoon: 3 Sitting and talking to someone: 0 Sitting quietly after lunch (no alcohol): 1 In a car, while stopped in traffic: 0 Total: 10     Objective:  Neurological Exam  Physical Exam Physical Examination:   Vitals:   11/10/19 1554  BP: (!) 155/96  Pulse: (!) 101    General Examination: The patient is a very pleasant 33 y.o. female in no acute distress. She appears well-developed and well-nourished and well groomed.   HEENT: Normocephalic, atraumatic, pupils are equal, round and reactive to  light, extraocular tracking is good without limitation to gaze excursion or nystagmus noted. Hearing is grossly intact. Face is symmetric with normal facial animation. Speech is clear with no dysarthria noted. There is no hypophonia. There is no lip, neck/head, jaw or voice tremor. Neck is supple with full range of passive and active motion. There are no carotid bruits on auscultation. Oropharynx exam reveals: moderate mouth dryness, adequate dental hygiene and mild airway crowding, due to Smaller airway entry and wider uvula, Mallampati class III, tonsils absent, tongue protrudes centrally in palate elevates symmetrically, neck circumference is 16 and three-quarter inches.  She has a  mild to moderate overbite.   Chest: Clear to auscultation without wheezing, rhonchi or crackles noted.  Heart: S1+S2+0, regular and normal without murmurs, rubs or gallops noted.   Abdomen: Soft, non-tender and non-distended with normal bowel sounds appreciated on auscultation.  Extremities: There is no pitting edema in the distal lower extremities bilaterally.   Skin: Warm and dry without trophic changes noted.   Musculoskeletal: exam reveals no obvious joint deformities, tenderness or joint swelling or erythema.   Neurologically:  Mental status: The patient is awake, alert and oriented in all 4 spheres. Her immediate and remote memory, attention, language skills and fund of knowledge are appropriate. There is no evidence of aphasia, agnosia, apraxia or anomia. Speech is clear with normal prosody and enunciation. Thought process is linear. Mood is normal and affect is normal.  Cranial nerves II - XII are as described above under HEENT exam.  Motor exam: Normal bulk, strength and tone is noted. There is no tremor, Romberg is negative. Fine motor skills and coordination: grossly intact.  Cerebellar testing: No dysmetria or intention tremor. There is no truncal or gait ataxia.  Sensory exam: intact to light touch in  the upper and lower extremities.  Gait, station and balance: She stands easily. No veering to one side is noted. No leaning to one side is noted. Posture is age-appropriate and stance is narrow based. Gait shows normal stride length and normal pace. No problems turning are noted. Tandem walk is unremarkable.                Assessment and Plan:  In summary, Olene CravenJessica Cua is a very pleasant 33 y.o.-year old female with an underlying medical history of ADHD, acid reflux, migraines, prediabetes and morbid obesity with a BMI of over 45, who Presents for evaluation of her sleep disorder, in particular, her daytime somnolence.  Her history and examination are concerning for underlying obstructive sleep apnea.  She also reports restless leg symptoms off and on.  I had a long chat with the patient about my findings and the diagnosis of OSA, its prognosis and treatment options. We talked about medical treatments, surgical interventions and non-pharmacological approaches. I explained in particular the risks and ramifications of untreated moderate to severe OSA, especially with respect to developing cardiovascular disease down the Road, including congestive heart failure, difficult to treat hypertension, cardiac arrhythmias, or stroke. Even type 2 diabetes has, in part, been linked to untreated OSA. Symptoms of untreated OSA include daytime sleepiness, memory problems, mood irritability and mood disorder such as depression and anxiety, lack of energy, as well as recurrent headaches, especially morning headaches. We talked about trying to maintain a healthy lifestyle in general, as well as the importance of weight control. We also talked about the importance of good sleep hygiene. I recommended the following at this time: sleep study. An attended sleep study would allow for monitoring her leg movements as well.  Her history is not very suggestive of narcolepsy.  I explained the sleep test procedure to the patient and  also outlined possible surgical and non-surgical treatment options of OSA, including the use of a custom-made dental device (which would require a referral to a specialist dentist or oral surgeon), upper airway surgical options, such as traditional UPPP or a novel less invasive surgical option in the form of Inspire hypoglossal nerve stimulation (which would involve a referral to an ENT surgeon). I also explained the CPAP treatment option to the patient, who indicated that she would be willing  to try CPAP if the need arises. I explained the importance of being compliant with PAP treatment, not only for insurance purposes but primarily to improve Her symptoms, and for the patient's long term health benefit, including to reduce Her cardiovascular risks. I answered all her questions today and the patient was in agreement. I plan to see her back after the sleep study is completed and encouraged her to call with any interim questions, concerns, problems or updates.   Thank you very much for allowing me to participate in the care of this nice patient. If I can be of any further assistance to you please do not hesitate to call me at [phone].  Sincerely,   Huston Foley, MD, PhD

## 2019-12-05 ENCOUNTER — Ambulatory Visit (INDEPENDENT_AMBULATORY_CARE_PROVIDER_SITE_OTHER): Payer: 59 | Admitting: Neurology

## 2019-12-05 DIAGNOSIS — G478 Other sleep disorders: Secondary | ICD-10-CM

## 2019-12-05 DIAGNOSIS — G4710 Hypersomnia, unspecified: Secondary | ICD-10-CM | POA: Diagnosis not present

## 2019-12-05 DIAGNOSIS — G2581 Restless legs syndrome: Secondary | ICD-10-CM

## 2019-12-05 DIAGNOSIS — R0683 Snoring: Secondary | ICD-10-CM

## 2019-12-05 DIAGNOSIS — G4719 Other hypersomnia: Secondary | ICD-10-CM

## 2019-12-05 DIAGNOSIS — Z6841 Body Mass Index (BMI) 40.0 and over, adult: Secondary | ICD-10-CM

## 2019-12-08 ENCOUNTER — Telehealth: Payer: Self-pay

## 2019-12-08 NOTE — Telephone Encounter (Signed)
I reached out to the pt and left a vm asking for a call back to review results.

## 2019-12-08 NOTE — Telephone Encounter (Signed)
-----   Message from Huston Foley, MD sent at 12/08/2019  8:40 AM EST ----- Patient referred by Ella Bodo, NP, seen by me on 11/10/19, HST on 12/05/19.   Please call and notify the patient that the recent home sleep test did not show any significant obstructive sleep apnea and only mild to moderate snoring, which was intermittent. Due to EDS reported, we may be able to bring her in for in lab sleep study testing with a nocturnal sleep study, followed by a daytime nap study to investigate her sleepiness further. She would have to come off her antidepressant medication, anxiety med and stimulant for theses tests and stay off for 2 weeks prior to testing. Please d/w patient. She should also discuss with her PCP about this. I can see her back in sleep clinic if needed for this. Thanks,  Huston Foley, MD, PhD [facility])

## 2019-12-08 NOTE — Progress Notes (Signed)
Patient referred by Ella Bodo, NP, seen by me on 11/10/19, HST on 12/05/19.   Please call and notify the patient that the recent home sleep test did not show any significant obstructive sleep apnea and only mild to moderate snoring, which was intermittent. Due to EDS reported, we may be able to bring her in for in lab sleep study testing with a nocturnal sleep study, followed by a daytime nap study to investigate her sleepiness further. She would have to come off her antidepressant medication, anxiety med and stimulant for theses tests and stay off for 2 weeks prior to testing. Please d/w patient. She should also discuss with her PCP about this. I can see her back in sleep clinic if needed for this. Thanks,  Huston Foley, MD, PhD [facility])

## 2019-12-08 NOTE — Procedures (Signed)
Patient Information     First Name: Tanya Last Name: Dorsey ID: [id number]  Birth Date: [birth year] Age: 34 Gender: Female  Referring Provider: Shawnee Knapp, MD BMI: 48.2 (W=282 lb, H=5' 4'')  Neck Circ.:  17 '' Epworth:  10/24   Sleep Study Information    Study Date: Dec 05, 2019 S/H/A Version: 001.001.001.001 / 4.1.1528 / 35  History:    34 year old woman with a history of ADHD, acid reflux, migraines, prediabetes and morbid obesity with a BMI of over 45, who reports a 1 year history of daytime somnolence. She feels like she can take a nap despite taking her Adderall.  She takes Adderall XR generic, 10 mg once daily.  She is also on Cymbalta and on Ativan as needed.   Summary & Diagnosis:     Primary snoring  Recommendations:     This home sleep test does not demonstrate any significant obstructive or central sleep disordered breathing. Some snoring was noted, appeared to be intermittent and mostly mild, at times moderate. Other causes of the patient's symptoms, including circadian rhythm disturbances, an underlying mood disorder, medication effect and/or an underlying medical problem cannot be ruled out based on this test. Clinical correlation is recommended. The patient should be cautioned not to drive, work at heights, or operate dangerous or heavy equipment when tired or sleepy. Review and reiteration of good sleep hygiene measures should be pursued with any patient. The patient can follow up with her referring provider, who will be notified of the test results. An appointment in sleep clinic can be made as necessary.   I certify that I have reviewed the raw data recording prior to the issuance of this report in accordance with the standards of the American Academy of Sleep Medicine (AASM).  Star Age, MD, PhD Diplomat, ABPN (Neurology and Sleep)                            Sleep Summary  Oxygen Saturation Statistics   Start Study Time: End Study Time: Total  Recording Time:          10:17:36 PM 6:54:31 AM   8 h, 36 min  Total Sleep Time % REM of Sleep Time:  6 h, 58 min  9.9    Mean: 96 Minimum: 94 Maximum: 99  Mean of Desaturations Nadirs (%):   91  Oxygen Desaturation. %:   4-9 10-20 >20 Total  Events Number Total    6  1 85.7 14.3  0 0.0  7 100.0  Oxygen Saturation: <90 <=88 <85 <80 <70  Duration (minutes): Sleep % 0.0 0.0  0.0 0.0  0.0 0.0 0.0 0.0 0.0 0.0     Respiratory Indices      Total Events REM NREM All Night  pRDI:  63  pAHI:  16 ODI:  7  pAHIc:  0  % CSR: 0.0 15.3 6.8 3.4 0.0 8.8 2.0 0.8 0.0 9.4 2.4 1.0 0.0       Pulse Rate Statistics during Sleep (BPM)      Mean: 83 Minimum: 58 Maximum: 115    Indices are calculated using technically valid sleep time of  6 hrs, 44 min. Central-Indices are calculated using technically valid sleep time of  6  hrs, 28 min. pRDI/pAHI are calculated using oxi desaturations ? 3%  Body Position Statistics  Position Supine Prone Right Left Non-Supine  Sleep (min) 165.5 177.5 57.5 0.0 235.0  Sleep % 39.5 42.4 13.7 0.0 56.2  pRDI 11.4 7.9 7.5 N/A 7.8  pAHI 1.9 2.4 2.1 N/A 2.3  ODI 0.4 1.4 2.1 N/A 1.6     Snoring Statistics Snoring Level (dB) >40 >50 >60 >70 >80 >Threshold (45)  Sleep (min) 182.3 13.0 6.3 0.8 0.0 24.4  Sleep % 43.6 3.1 1.5 0.2 0.0 5.8    Mean: 41 dB Sleep Stages Chart

## 2019-12-12 NOTE — Telephone Encounter (Signed)
I reached out to the pt and we were able to discuss results. Pt verbalized understanding. She reports understanding on having to d/c antidepressant, anxiety and stimulant medications 2 week prior to study. Pt reports at this time she is going to talk with her psychiatrist further and will call back if she would like to proceed with scheduling nocturnal sleep study and daytime nap study.

## 2021-05-09 IMAGING — MG DIGITAL DIAGNOSTIC BILATERAL MAMMOGRAM WITH TOMO AND CAD
6 of 10 series · 6 of 30 positions shown · non-contrast
Comparison: Baseline exam

CLINICAL DATA: Palpable abnormality in the LEFT breast since
[REDACTED].

EXAM:
DIGITAL DIAGNOSTIC BILATERAL MAMMOGRAM WITH CAD AND TOMO
ULTRASOUND LEFT BREAST

[L CC synth-2D]
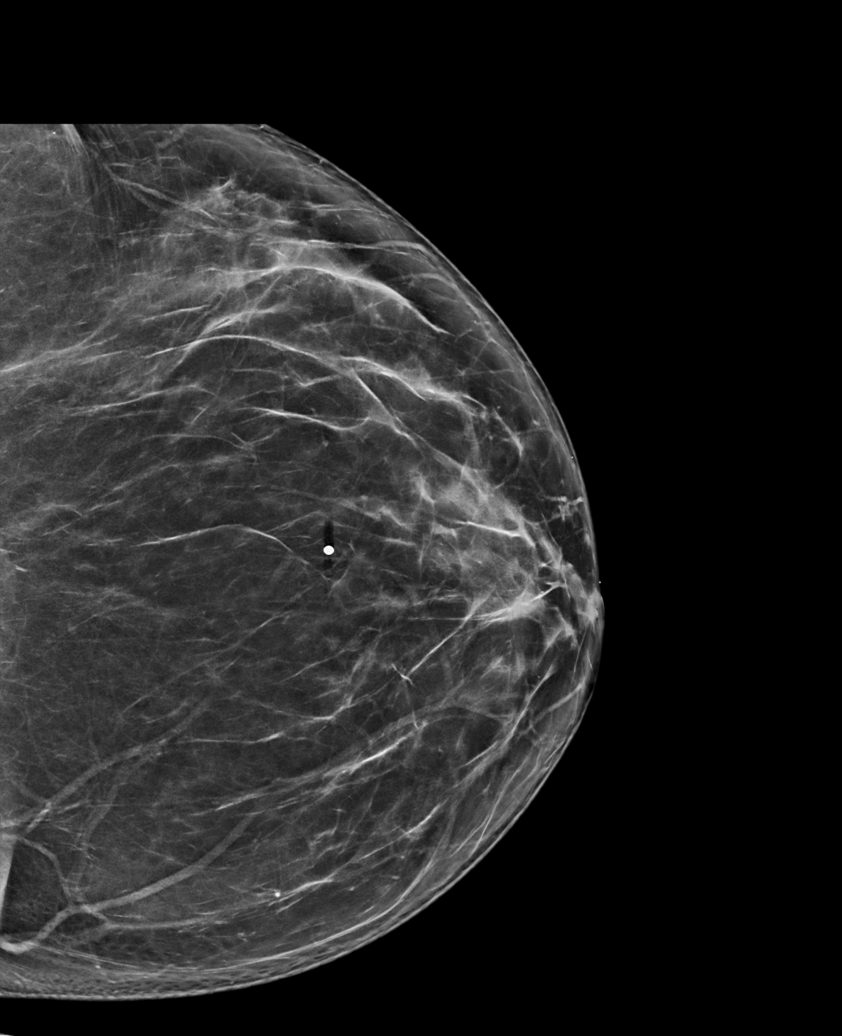

[R CC synth-2D]
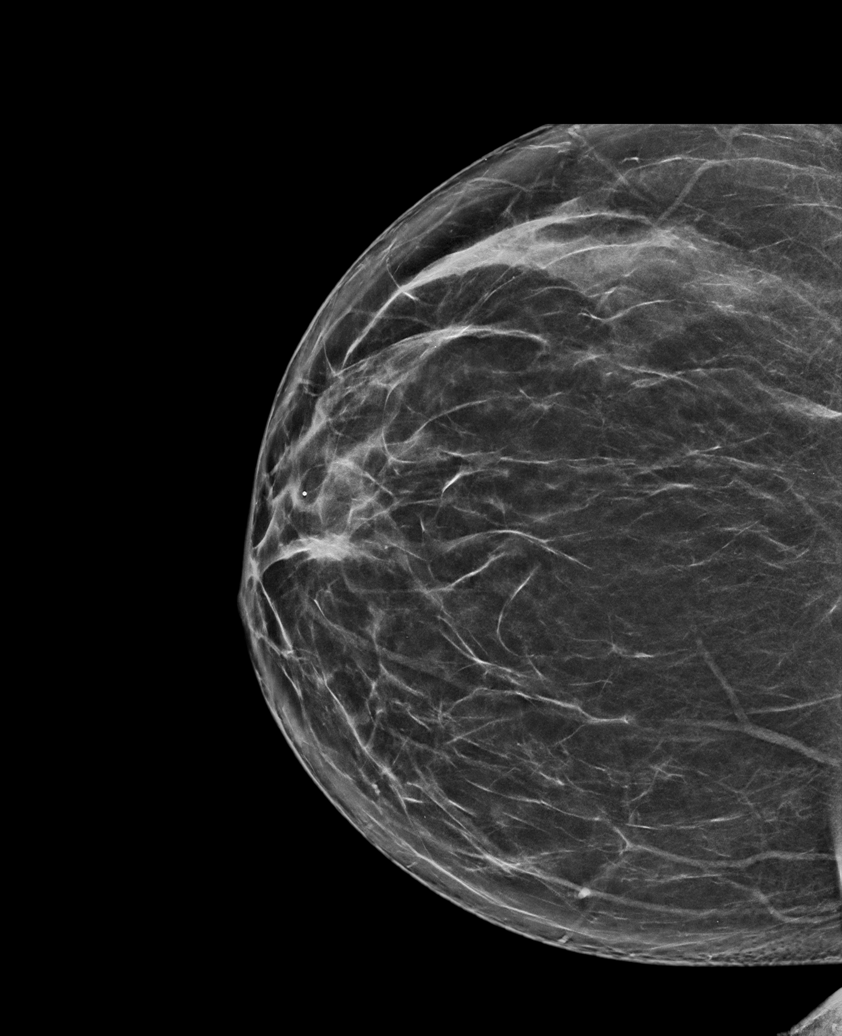

[R MLO synth-2D]
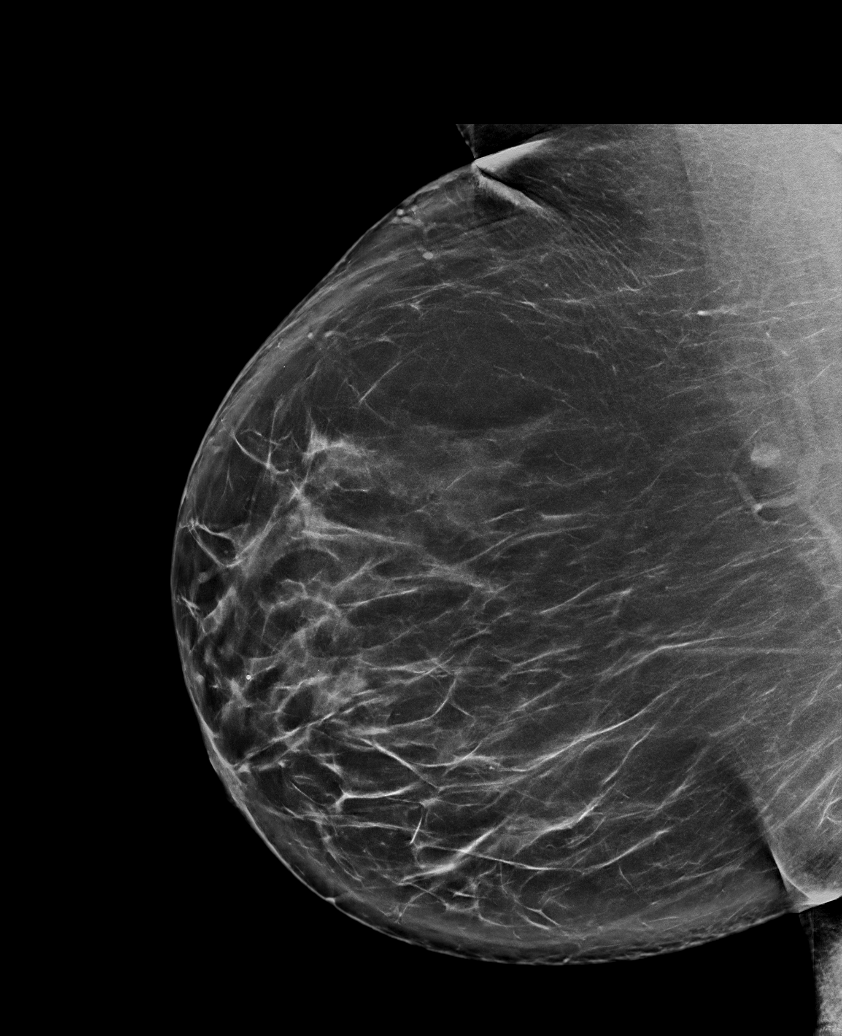

[L MLO synth-2D]
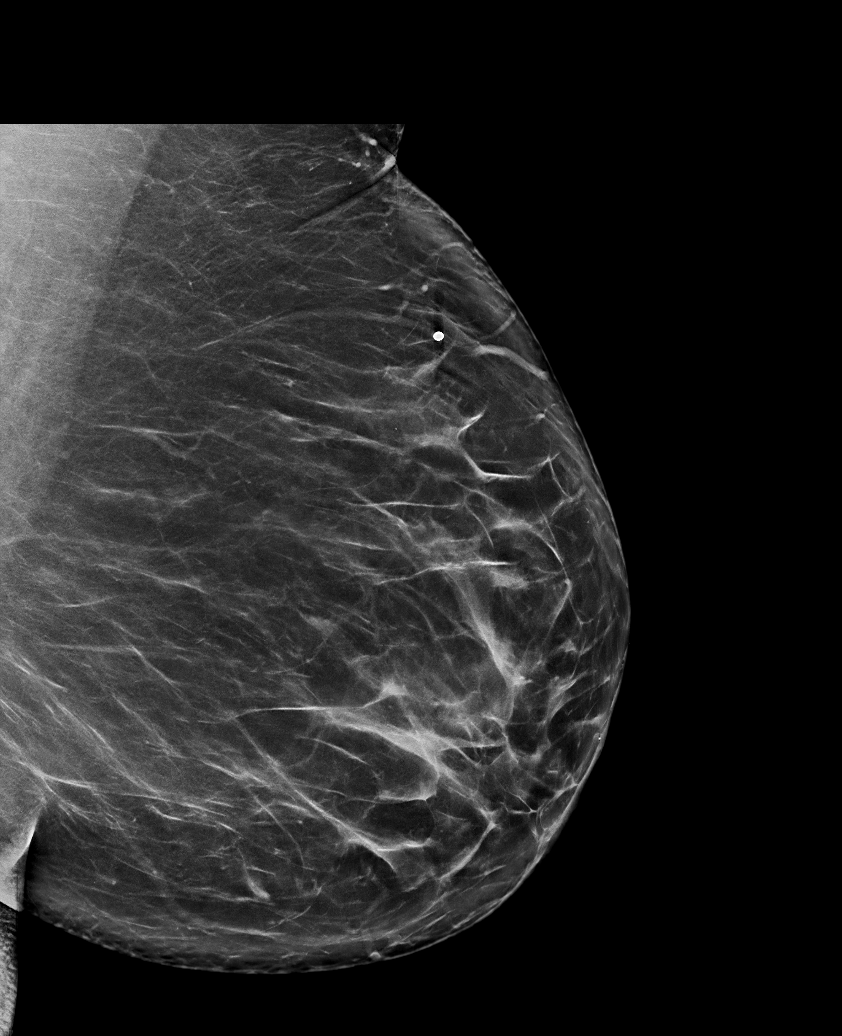

[L TAN synth-2D]
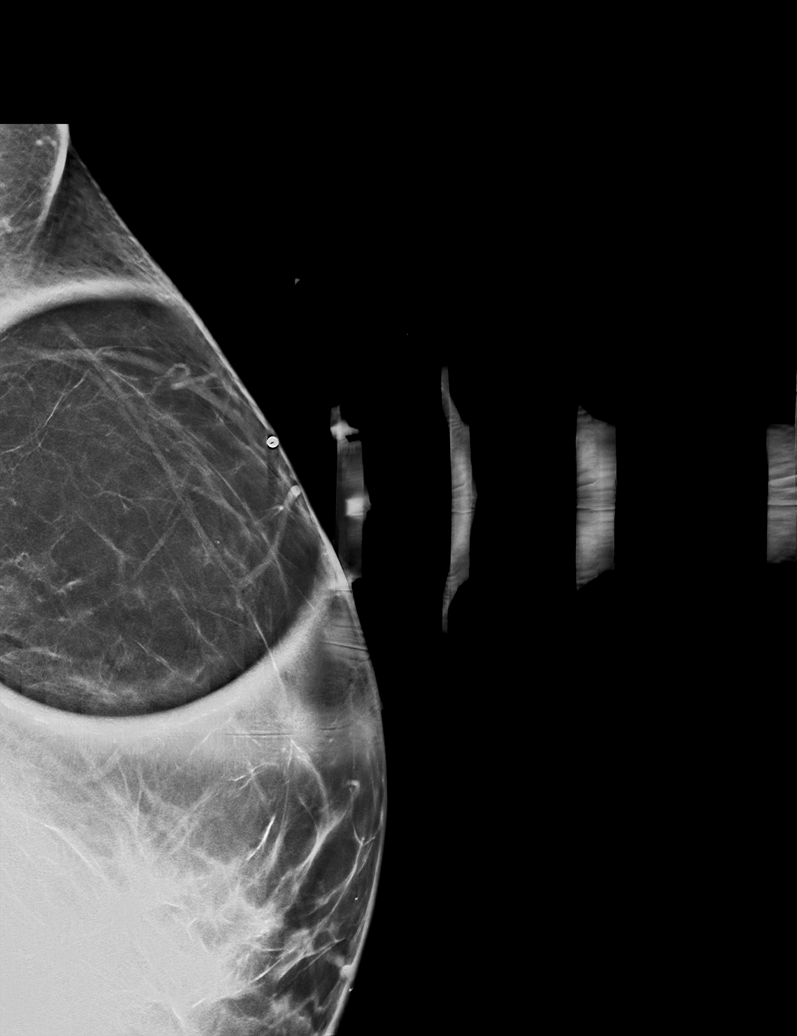

[R CC tomo · tomo slice 41/81.0]
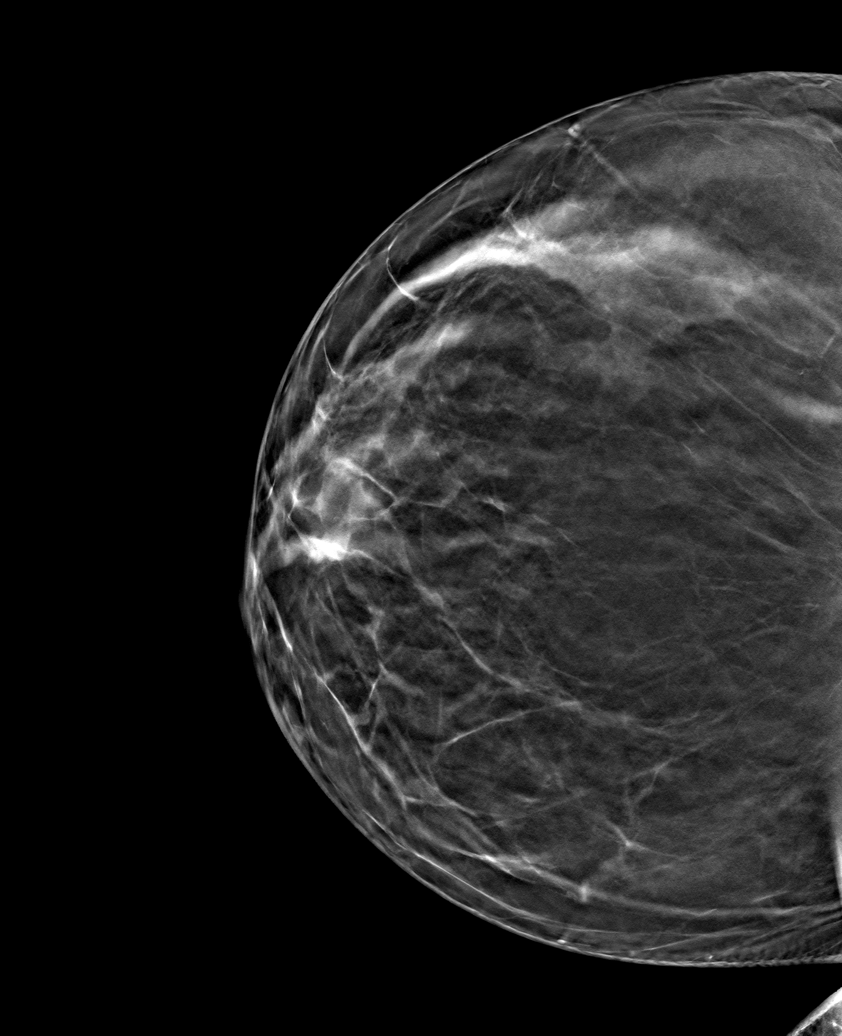

[6 of 30 positions shown; findings below may reference images not displayed]

ACR Breast Density Category b: There are scattered areas of
fibroglandular density.
FINDINGS: No suspicious mass, distortion, or microcalcifications are
identified to suggest presence of malignancy.

Mammographic images were processed with CAD.

On physical exam, I palpate soft thickening in the UPPER central
portion of the LEFT breast, in the area of concern to the patient
and best palpated when she sits.

Targeted ultrasound is performed, showing normal appearing
fibroglandular tissue in the area of concern. No suspicious mass,
distortion, or acoustic shadowing is demonstrated with ultrasound.
IMPRESSION: No mammographic or ultrasound evidence for malignancy.

RECOMMENDATION:
Screening mammogram at age 39 unless there are persistent or
intervening clinical concerns. (Code:4H-M-PDG)

I have discussed the findings and recommendations with the patient.
Results were also provided in writing at the conclusion of the
visit. If applicable, a reminder letter will be sent to the patient
regarding the next appointment.

BI-RADS CATEGORY  1: Negative.

## 2021-08-12 ENCOUNTER — Other Ambulatory Visit: Payer: Self-pay

## 2021-08-12 ENCOUNTER — Ambulatory Visit (HOSPITAL_COMMUNITY)
Admission: RE | Admit: 2021-08-12 | Discharge: 2021-08-12 | Disposition: A | Discharge status: Home / Self Care | Payer: Managed Care, Other (non HMO) | Source: Ambulatory Visit | Attending: Student | Admitting: Student

## 2021-08-12 ENCOUNTER — Encounter (HOSPITAL_COMMUNITY): Payer: Self-pay

## 2021-08-12 VITALS — BP 159/92 | HR 94 | Temp 98.4°F | Resp 19

## 2021-08-12 DIAGNOSIS — J019 Acute sinusitis, unspecified: Secondary | ICD-10-CM

## 2021-08-12 LAB — POCT RAPID STREP A, ED / UC: Streptococcus, Group A Screen (Direct): NEGATIVE

## 2021-08-12 MED ORDER — AMOXICILLIN 875 MG PO TABS: 875.0000 mg | ORAL_TABLET | Freq: Two times a day (BID) | ORAL | 0 refills | Status: AC

## 2021-08-12 NOTE — ED Provider Notes (Signed)
[facility]    CSN: [id number] Arrival date & time: 08/12/21  0847      History   Chief Complaint Chief Complaint  Patient presents with   Appointment   Fever   Headache    HPI Tanya Dorsey is a 35 y.o. female presenting with headache, chills, neck pain for about 5 days.  Medical history recurrent ear infections, wears hearing aids.  States progressively worsening pain behind her left eye and left ear pain, with some swollen lymph nodes behind the left ear.  Denies new hearing changes, dizziness, tinnitus.  Denies sore throat.  Last antipyretic was 3 hours ago.  HPI  Past Medical History:  Diagnosis Date   Cholestasis of pregnancy    Depression    GDM, class A2 07/29/2014   Gestational diabetes mellitus, antepartum    Hypertension     Patient Active Problem List   Diagnosis Date Noted   Prediabetes 05/03/2018   Depression 07/29/2014   ADD (attention deficit disorder) 07/29/2014   Severe obesity (BMI >= 40) (HCC) 07/29/2014   Plantar fasciitis 07/29/2014   Anxiety state 07/29/2014    Past Surgical History:  Procedure Laterality Date   CESAREAN SECTION N/A 08/02/2014   Procedure: CESAREAN SECTION;  Surgeon: Esmeralda Arthur, MD;  Location: [facility];  Service: Obstetrics;  Laterality: N/A;   CHOLECYSTECTOMY     WISDOM TOOTH EXTRACTION      OB History     Gravida  1   Para  1   Term  1   Preterm  0   AB  0   Living  1      SAB  0   IAB  0   Ectopic  0   Multiple  0   Live Births  1            Home Medications    Prior to Admission medications   Medication Sig Start Date End Date Taking? Authorizing Provider  amoxicillin (AMOXIL) 875 MG tablet Take 1 tablet (875 mg total) by mouth 2 (two) times daily for 7 days. 08/12/21 08/19/21 Yes Rhys Martini, PA-C  amphetamine-dextroamphetamine (ADDERALL) 10 MG tablet Take 10 mg by mouth daily with breakfast.    [provider]  cetirizine (ZYRTEC) 10 MG tablet Take 1 tablet (10  mg total) by mouth daily. 11/27/18 12/27/18  Benay Pike, NP  DULoxetine (CYMBALTA) 20 MG capsule Take 20 mg by mouth daily.    [provider]  LORazepam (ATIVAN) 0.5 MG tablet Take 0.5 mg by mouth every 8 (eight) hours as needed for anxiety.    [provider]  metFORMIN (GLUCOPHAGE) 500 MG tablet Take 1 tablet (500 mg total) by mouth 2 (two) times daily with a meal. 07/07/18 10/05/18  Georgina Quint, MD  SPRINTEC 28 0.25-35 MG-MCG tablet TK 1 T PO QD 05/24/18   [provider]    Family History Family History  Problem Relation Age of Onset   Diabetes Mother    Hypertension Mother    Hypertension Father     Social History Social History   Tobacco Use   Smoking status: Never   Smokeless tobacco: Never  Substance Use Topics   Alcohol use: No   Drug use: No     Allergies   Patient has no known allergies.   Review of Systems Review of Systems  HENT:  Positive for ear pain and sinus pain.   All other systems reviewed and are negative.  Physical Exam Triage Vital Signs ED Triage Vitals  Enc Vitals Group     BP 08/12/21 0905 (!) 159/92     Pulse Rate 08/12/21 0905 94     Resp 08/12/21 0905 19     Temp 08/12/21 0905 98.4 F (36.9 C)     Temp Source 08/12/21 0905 Oral     SpO2 08/12/21 0905 98 %     Weight --      Height --      Head Circumference --      Peak Flow --      Pain Score 08/12/21 0903 3     Pain Loc --      Pain Edu? --      Excl. in GC? --    No data found.  Updated Vital Signs BP (!) 159/92 (BP Location: Right Arm)   Pulse 94   Temp 98.4 F (36.9 C) (Oral)   Resp 19   LMP 08/07/2021 (Approximate)   SpO2 98%   Visual Acuity Right Eye Distance:   Left Eye Distance:   Bilateral Distance:    Right Eye Near:   Left Eye Near:    Bilateral Near:     Physical Exam Vitals reviewed.  Constitutional:      General: She is not in acute distress.    Appearance: Normal appearance. She is not  ill-appearing.  HENT:     Head: Normocephalic and atraumatic.     Right Ear: Tympanic membrane, ear canal and external ear normal. No tenderness. No middle ear effusion. There is no impacted cerumen. Tympanic membrane is not perforated, erythematous, retracted or bulging.     Left Ear: Tympanic membrane, ear canal and external ear normal. No tenderness.  No middle ear effusion. There is no impacted cerumen. Tympanic membrane is not perforated, erythematous, retracted or bulging.     Nose: No congestion.     Right Sinus: No maxillary sinus tenderness or frontal sinus tenderness.     Left Sinus: Frontal sinus tenderness present. No maxillary sinus tenderness.     Mouth/Throat:     Mouth: Mucous membranes are moist.     Pharynx: Uvula midline. No oropharyngeal exudate or posterior oropharyngeal erythema.  Eyes:     Extraocular Movements: Extraocular movements intact.     Pupils: Pupils are equal, round, and reactive to light.  Neck:     Comments: L occipital lymphadenopathy Cardiovascular:     Rate and Rhythm: Normal rate and regular rhythm.     Heart sounds: Normal heart sounds.  Pulmonary:     Effort: Pulmonary effort is normal.     Breath sounds: Normal breath sounds. No decreased breath sounds, wheezing, rhonchi or rales.  Abdominal:     Palpations: Abdomen is soft.     Tenderness: There is no abdominal tenderness. There is no guarding or rebound.  Neurological:     General: No focal deficit present.     Mental Status: She is alert and oriented to person, place, and time.  Psychiatric:        Mood and Affect: Mood normal.        Behavior: Behavior normal.        Thought Content: Thought content normal.        Judgment: Judgment normal.     UC Treatments / Results  Labs (all labs ordered are listed, but only abnormal results are displayed) Labs Reviewed  POCT RAPID STREP A, ED / UC    EKG   Radiology  No results found.  Procedures Procedures (including critical care  time)  Medications Ordered in UC Medications - No data to display  Initial Impression / Assessment and Plan / UC Course  I have reviewed the triage vital signs and the nursing notes.  Pertinent labs & imaging results that were available during my care of the patient were reviewed by me and considered in my medical decision making (see chart for details).     This patient is a very pleasant 35 y.o. year old female presenting with acute sinusitis following viral URI. Today this pt is afebrile nontachycardic nontachypneic, oxygenating well on room air, no wheezes rhonchi or rales. Last antipyretic 3 hours ago.  Amoxicillin as below.   ED return precautions discussed. Patient verbalizes understanding and agreement.     Final Clinical Impressions(s) / UC Diagnoses   Final diagnoses:  Acute non-recurrent sinusitis, unspecified location     Discharge Instructions      -Start the antibiotic-Amoxicillin, 1 pill every 12 hours for 7 days.  You can take this with food like with breakfast and dinner. -Continue OTC medications for additional relief.   ED Prescriptions     Medication Sig Dispense Auth. Provider   amoxicillin (AMOXIL) 875 MG tablet Take 1 tablet (875 mg total) by mouth 2 (two) times daily for 7 days. 14 tablet Rhys Martini, PA-C      PDMP not reviewed this encounter.   Rhys Martini, PA-C 08/12/21 [phone]

## 2021-08-12 NOTE — Discharge Instructions (Addendum)
-  Start the antibiotic-Amoxicillin, 1 pill every 12 hours for 7 days.  You can take this with food like with breakfast and dinner. -Continue OTC medications for additional relief.

## 2021-08-12 NOTE — ED Triage Notes (Signed)
Pt presents with c/o fever x 2 days. States she has a headache and pain around her left eye. States she took an at home COVID test that was negative.

## 2022-03-05 ENCOUNTER — Other Ambulatory Visit (HOSPITAL_COMMUNITY): Payer: Self-pay

## 2022-03-05 MED ORDER — AMPHETAMINE-DEXTROAMPHET ER 15 MG PO CP24
ORAL_CAPSULE | ORAL | 0 refills | Status: DC
  Filled 2022-03-05: qty 30, 30d supply, fill #0

## 2022-03-12 ENCOUNTER — Other Ambulatory Visit (HOSPITAL_COMMUNITY): Payer: Self-pay

## 2022-03-12 MED ORDER — AMPHETAMINE-DEXTROAMPHET ER 15 MG PO CP24
ORAL_CAPSULE | ORAL | 0 refills | Status: DC
  Filled 2022-05-05: qty 30, fill #0
  Filled 2022-05-07 – 2022-05-08 (×2): qty 30, 30d supply, fill #0

## 2022-03-12 MED ORDER — AMPHETAMINE-DEXTROAMPHET ER 15 MG PO CP24
ORAL_CAPSULE | ORAL | 0 refills | Status: DC
  Filled 2022-04-10: qty 30, 30d supply, fill #0

## 2022-03-12 MED ORDER — BUPROPION HCL ER (SR) 150 MG PO TB12
ORAL_TABLET | ORAL | 11 refills | Status: DC
  Filled 2022-03-12 – 2022-04-10 (×2): qty 60, 30d supply, fill #0
  Filled 2022-05-05: qty 60, 30d supply, fill #1
  Filled 2022-06-26: qty 60, 30d supply, fill #2

## 2022-03-20 ENCOUNTER — Other Ambulatory Visit (HOSPITAL_COMMUNITY): Payer: Self-pay

## 2022-04-10 ENCOUNTER — Other Ambulatory Visit (HOSPITAL_COMMUNITY): Payer: Self-pay

## 2022-05-05 ENCOUNTER — Other Ambulatory Visit (HOSPITAL_COMMUNITY): Payer: Self-pay

## 2022-05-07 ENCOUNTER — Other Ambulatory Visit (HOSPITAL_COMMUNITY): Payer: Self-pay

## 2022-05-07 MED ORDER — BD SYRINGE LUER-LOK 1 ML MISC
0 refills | Status: DC
  Filled 2022-05-07: qty 20, 20d supply, fill #0

## 2022-05-07 MED ORDER — "EASY TOUCH FLIPLOCK NEEDLES 18G X 1"" MISC"
0 refills | Status: DC
  Filled 2022-05-07: qty 20, 20d supply, fill #0

## 2022-05-08 ENCOUNTER — Other Ambulatory Visit (HOSPITAL_COMMUNITY): Payer: Self-pay

## 2022-05-08 MED ORDER — "EASY TOUCH FLIPLOCK NEEDLES 25G X 5/8"" MISC"
0 refills | Status: DC
  Filled 2022-05-08: qty 20, 20d supply, fill #0

## 2022-05-08 MED ORDER — FINASTERIDE 1 MG PO TABS
ORAL_TABLET | ORAL | 1 refills | Status: DC
Start: 2022-05-07 — End: 2022-05-14
  Filled 2022-05-08: qty 30, 30d supply, fill #0

## 2022-05-08 MED ORDER — TESTOSTERONE CYPIONATE 100 MG/ML IM SOLN
INTRAMUSCULAR | 1 refills | Status: DC
Start: 2022-05-07 — End: 2022-05-14
  Filled 2022-05-08: qty 10, 90d supply, fill #0

## 2022-05-09 ENCOUNTER — Other Ambulatory Visit (HOSPITAL_COMMUNITY): Payer: Self-pay

## 2022-05-12 ENCOUNTER — Other Ambulatory Visit (HOSPITAL_COMMUNITY): Payer: Self-pay

## 2022-05-14 ENCOUNTER — Ambulatory Visit: Payer: Managed Care, Other (non HMO) | Admitting: Physician Assistant

## 2022-05-14 ENCOUNTER — Other Ambulatory Visit (HOSPITAL_COMMUNITY): Payer: Self-pay

## 2022-05-14 ENCOUNTER — Encounter: Payer: Self-pay | Admitting: Physician Assistant

## 2022-05-14 VITALS — BP 130/88 | HR 108 | Temp 98.2°F | Ht 65.0 in | Wt 296.4 lb

## 2022-05-14 DIAGNOSIS — E8881 Metabolic syndrome: Secondary | ICD-10-CM | POA: Diagnosis not present

## 2022-05-14 DIAGNOSIS — R5383 Other fatigue: Secondary | ICD-10-CM | POA: Diagnosis not present

## 2022-05-14 DIAGNOSIS — E119 Type 2 diabetes mellitus without complications: Secondary | ICD-10-CM | POA: Insufficient documentation

## 2022-05-14 DIAGNOSIS — Z13220 Encounter for screening for lipoid disorders: Secondary | ICD-10-CM | POA: Diagnosis not present

## 2022-05-14 DIAGNOSIS — E559 Vitamin D deficiency, unspecified: Secondary | ICD-10-CM | POA: Diagnosis not present

## 2022-05-14 DIAGNOSIS — R030 Elevated blood-pressure reading, without diagnosis of hypertension: Secondary | ICD-10-CM | POA: Diagnosis not present

## 2022-05-14 DIAGNOSIS — Z136 Encounter for screening for cardiovascular disorders: Secondary | ICD-10-CM

## 2022-05-14 LAB — CBC WITH DIFFERENTIAL/PLATELET
Basophils Absolute: 0 10*3/uL (ref 0.0–0.1)
Basophils Relative: 0.3 % (ref 0.0–3.0)
Eosinophils Absolute: 0.2 10*3/uL (ref 0.0–0.7)
Eosinophils Relative: 1.4 % (ref 0.0–5.0)
HCT: 38.4 % (ref 36.0–46.0)
Hemoglobin: 12.1 g/dL (ref 12.0–15.0)
Lymphocytes Relative: 15.3 % (ref 12.0–46.0)
Lymphs Abs: 2 10*3/uL (ref 0.7–4.0)
MCHC: 31.6 g/dL (ref 30.0–36.0)
MCV: 75.9 fl — ABNORMAL LOW (ref 78.0–100.0)
Monocytes Absolute: 1.1 10*3/uL — ABNORMAL HIGH (ref 0.1–1.0)
Monocytes Relative: 8.1 % (ref 3.0–12.0)
Neutro Abs: 9.8 10*3/uL — ABNORMAL HIGH (ref 1.4–7.7)
Neutrophils Relative %: 74.9 % (ref 43.0–77.0)
Platelets: 393 10*3/uL (ref 150.0–400.0)
RBC: 5.06 Mil/uL (ref 3.87–5.11)
RDW: 16.4 % — ABNORMAL HIGH (ref 11.5–15.5)
WBC: 13.1 10*3/uL — ABNORMAL HIGH (ref 4.0–10.5)

## 2022-05-14 LAB — LIPID PANEL
Cholesterol: 203 mg/dL — ABNORMAL HIGH (ref 0–200)
HDL: 52.7 mg/dL (ref >39.00)
LDL Cholesterol: 117 mg/dL — ABNORMAL HIGH (ref 0–99)
NonHDL: 149.98
Total CHOL/HDL Ratio: 4
Triglycerides: 163 mg/dL — ABNORMAL HIGH (ref 0.0–149.0)
VLDL: 32.6 mg/dL (ref 0.0–40.0)

## 2022-05-14 LAB — COMPREHENSIVE METABOLIC PANEL
ALT: 19 U/L (ref 0–35)
AST: 10 U/L (ref 0–37)
Albumin: 4.1 g/dL (ref 3.5–5.2)
Alkaline Phosphatase: 99 U/L (ref 39–117)
BUN: 10 mg/dL (ref 6–23)
CO2: 25 mEq/L (ref 19–32)
Calcium: 9.5 mg/dL (ref 8.4–10.5)
Chloride: 102 mEq/L (ref 96–112)
Creatinine, Ser: 0.86 mg/dL (ref 0.40–1.20)
GFR: 86.55 mL/min (ref >60.00)
Glucose, Bld: 175 mg/dL — ABNORMAL HIGH (ref 70–99)
Potassium: 4.3 mEq/L (ref 3.5–5.1)
Sodium: 135 mEq/L (ref 135–145)
Total Bilirubin: 0.3 mg/dL (ref 0.2–1.2)
Total Protein: 7.1 g/dL (ref 6.0–8.3)

## 2022-05-14 LAB — TSH: TSH: 1.54 u[IU]/mL (ref 0.35–5.50)

## 2022-05-14 LAB — VITAMIN D 25 HYDROXY (VIT D DEFICIENCY, FRACTURES): VITD: 28.58 ng/mL — ABNORMAL LOW (ref 30.00–100.00)

## 2022-05-14 LAB — VITAMIN B12: Vitamin B-12: 303 pg/mL (ref 211–911)

## 2022-05-14 LAB — HEMOGLOBIN A1C: Hgb A1c MFr Bld: 7 % — ABNORMAL HIGH (ref 4.6–6.5)

## 2022-05-14 MED ORDER — AMLODIPINE BESYLATE 2.5 MG PO TABS
2.5000 mg | ORAL_TABLET | Freq: Every day | ORAL | 1 refills | Status: DC
  Filled 2022-05-14: qty 90, 90d supply, fill #0
  Filled 2022-07-07: qty 30, 30d supply, fill #1
  Filled 2022-08-11: qty 30, 30d supply, fill #2
  Filled 2022-08-30 – 2022-09-07 (×2): qty 30, 30d supply, fill #3

## 2022-05-14 NOTE — Patient Instructions (Signed)
It was great to see you!  Trial the amlodopine/norvasc 2.5 mg daily -- send me a mychart message in 1-3 months with average blood pressures to let me know if this working well for you -- or sooner if concerns  I will update your blood work today and adjust metformin accordingly  Let's follow-up based on results, sooner if you have concerns.  Take care,  Jarold Motto PA-C

## 2022-05-14 NOTE — Progress Notes (Addendum)
Tanya Dorsey is a 36 y.o. female here for a follow up of a pre-existing problem.  History of Present Illness:   Chief Complaint  Patient presents with   Establish Care   c/o elevated blood pressure     Pt has been elevated blood pressure for the past year.     HPI  Elevated BP Readings  Patient here to establish care. She presents with c/o of elevated blood pressure for the past year or so. States she has had issue with elevated blood pressure after her pregnancy in 2015. Reports she was taking medication for this for a week during that time. She does check her blood pressure at home and this has been usually high. She has been trying to exercise and work on diet. States she was doing weight lifting and has noticed improvement in her blood pressure readings. However, she recently had plantar fasciitis and has not been exercising as usual. States she is interested to start on medication. She does have family hx of HTN in mother and father.   Patient denies chest pain, SOB, blurred vision, dizziness, unusual headaches, lower leg swelling.  Denies excessive caffeine intake, stimulant usage, excessive alcohol intake, or increase in salt consumption.  BP Readings from Last 3 Encounters:  05/14/22 140/90  08/12/21 (!) 159/92  11/10/19 (!) 155/96    Prediabetes  Patient is currently taking Metformin XR 500 mg daily with no complications. She is tolerating her medication with no adverse side effects. Reports she was started on this during her pregnancy due to gestational diabetes. States she has been taking Metformin few times a week or as needed.  She has had blood work done in 2020 and she was found to be in diabetic range at that time. Her A1c was 5.4 in 2020 and had fasting sugar of 105 a that time. States she went to urgent care and was prescribed Metformin. Denies hypoglycemic or hyperglycemic events at this time.    Anxiety/ Depression; ADD Patient is currently taking Adderall 15 mg  daily, Wellbutrin 150 mg twice daily and Cymbalta 60 mg daily with no complications. Her symptoms seems to be manageable. Denies SI/HI.   Patient would like to update her blood work today. She would like to check her vitamin and thyroid levels as well.  Past Medical History:  Diagnosis Date   Allergy    Anxiety    Arthritis    Cholestasis of pregnancy    Depression    GERD (gastroesophageal reflux disease)    Gestational diabetes mellitus, antepartum    Heart murmur    Hypertension      Social History   Tobacco Use   Smoking status: Never   Smokeless tobacco: Never  Vaping Use   Vaping Use: Never used  Substance Use Topics   Alcohol use: No   Drug use: No    Past Surgical History:  Procedure Laterality Date   CESAREAN SECTION N/A 08/02/2014   Procedure: CESAREAN SECTION;  Surgeon: Esmeralda Arthur, MD;  Location: [facility];  Service: Obstetrics;  Laterality: N/A;   CHOLECYSTECTOMY     WISDOM TOOTH EXTRACTION      Family History  Problem Relation Age of Onset   Diabetes Mother    Hypertension Mother    Hypertension Father     No Known Allergies  Current Medications:   Current Outpatient Medications:    amphetamine-dextroamphetamine (ADDERALL XR) 15 MG 24 hr capsule, Take 1 capsule by mouth daily, Disp: 30 capsule, Rfl: 0  amphetamine-dextroamphetamine (ADDERALL XR) 15 MG 24 hr capsule, Take 1 capsule by mouth daily, Disp: 30 capsule, Rfl: 0   amphetamine-dextroamphetamine (ADDERALL XR) 15 MG 24 hr capsule, Take 1 capsule by mouth daily., Disp: 30 capsule, Rfl: 0   buPROPion (WELLBUTRIN SR) 150 MG 12 hr tablet, Take 1 tablet by mouth 2 times a day (Patient taking differently: Take 150 mg by mouth daily.), Disp: 60 tablet, Rfl: 11   DULoxetine (CYMBALTA) 60 MG capsule, Take 60 mg by mouth daily., Disp: , Rfl:    metFORMIN (GLUCOPHAGE-XR) 500 MG 24 hr tablet, Take 500 mg by mouth daily., Disp: , Rfl:    PARAGARD INTRAUTERINE COPPER IU, by Intrauterine route. Inserted 2  yrs ago at [facility]., Disp: , Rfl:    Review of Systems:   ROS Negative unless otherwise specified per HPI.   Vitals:   Vitals:   05/14/22 0948  BP: 140/90  Pulse: (!) 113  Temp: 98.2 F (36.8 C)  TempSrc: Temporal  SpO2: 97%  Weight: 296 lb 6.1 oz (134.4 kg)  Height: 5\' 5"  (1.651 m)     Body mass index is 49.32 kg/m.  Physical Exam:   Physical Exam Vitals and nursing note reviewed.  Constitutional:      General: She is not in acute distress.    Appearance: She is well-developed. She is not ill-appearing or toxic-appearing.  Cardiovascular:     Rate and Rhythm: Normal rate and regular rhythm.     Pulses: Normal pulses.     Heart sounds: Normal heart sounds, S1 normal and S2 normal.  Pulmonary:     Effort: Pulmonary effort is normal.     Breath sounds: Normal breath sounds.  Skin:    General: Skin is warm and dry.  Neurological:     Mental Status: She is alert.     GCS: GCS eye subscore is 4. GCS verbal subscore is 5. GCS motor subscore is 6.  Psychiatric:        Speech: Speech normal.        Behavior: Behavior normal. Behavior is cooperative.     Assessment and Plan:   Elevated blood pressure reading Borderline elevated today We are going to trial 2.5 mg amlodopine to see if this can help bring her numbers down modestly Continue to monitor BP regularly with this new addition I've asked her to send me a mychart message in 1-2 months with BP averages so I know if this is working well for her Follow-up in 3-6 months, sooner if concerns  Insulin resistance Update A1c and adjust metformin XR accordingly -- will try to maintain daily dosing for compliance purposes  Vitamin D deficiency Update Vit D and provide recommendations accordingly  Fatigue, unspecified type Screen B12 due to polypharmacy and metformin use  Encounter for lipid screening for cardiovascular disease Update lipid panel and provide recommendations accordingly  I,Savera  Zaman,acting as a scribe for , PA.,have documented all relevant documentation on the behalf of Jarold Motto, PA,as directed by  Jarold Motto, PA while in the presence of Jarold Motto, Jarold Motto.   I, [place], Jarold Motto, have reviewed all documentation for this visit. The documentation on 05/14/22 for the exam, diagnosis, procedures, and orders are all accurate and complete.  05/16/22, PA-C

## 2022-05-15 NOTE — Telephone Encounter (Signed)
Pt called back told her per [name], Thanks for reaching out!!   It is possible that any type of steroid injections can cause transient fluctuations in your A1c. I typically do not label any patient as "diabetic" unless we have multiple A1c's over 6.5%. I'm not putting that anywhere near your chart right now -- try not to fret -- easier said than done, I know!!   Danise Edge is a great resource -- please lean on her!   As far as your other questions go -- I think it would be best for you to make a virtual appointment (in-person is fine too -- whichever you prefer!) to review these questions so I can be thorough and dedicate focused time on you.   Here to support you. Pt verbalized understanding and said she has made an appt on Tuesday with Samantha to discuss questions. Told her okay good, see you then.

## 2022-05-15 NOTE — Telephone Encounter (Signed)
Left message on voicemail to call office.  

## 2022-05-20 ENCOUNTER — Ambulatory Visit (INDEPENDENT_AMBULATORY_CARE_PROVIDER_SITE_OTHER): Payer: Managed Care, Other (non HMO) | Admitting: Physician Assistant

## 2022-05-20 ENCOUNTER — Encounter: Payer: Self-pay | Admitting: Physician Assistant

## 2022-05-20 ENCOUNTER — Other Ambulatory Visit (HOSPITAL_COMMUNITY): Payer: Self-pay

## 2022-05-20 VITALS — BP 130/80 | HR 94 | Temp 97.8°F | Ht 65.0 in | Wt 297.5 lb

## 2022-05-20 DIAGNOSIS — D72829 Elevated white blood cell count, unspecified: Secondary | ICD-10-CM

## 2022-05-20 DIAGNOSIS — E538 Deficiency of other specified B group vitamins: Secondary | ICD-10-CM | POA: Diagnosis not present

## 2022-05-20 DIAGNOSIS — E559 Vitamin D deficiency, unspecified: Secondary | ICD-10-CM

## 2022-05-20 DIAGNOSIS — E8881 Metabolic syndrome: Secondary | ICD-10-CM | POA: Diagnosis not present

## 2022-05-20 LAB — CBC WITH DIFFERENTIAL/PLATELET
Basophils Absolute: 0 10*3/uL (ref 0.0–0.1)
Basophils Relative: 0.3 % (ref 0.0–3.0)
Eosinophils Absolute: 0.1 10*3/uL (ref 0.0–0.7)
Eosinophils Relative: 1.2 % (ref 0.0–5.0)
HCT: 37.9 % (ref 36.0–46.0)
Hemoglobin: 12.2 g/dL (ref 12.0–15.0)
Lymphocytes Relative: 13.1 % (ref 12.0–46.0)
Lymphs Abs: 1.4 10*3/uL (ref 0.7–4.0)
MCHC: 32.1 g/dL (ref 30.0–36.0)
MCV: 76.2 fl — ABNORMAL LOW (ref 78.0–100.0)
Monocytes Absolute: 0.7 10*3/uL (ref 0.1–1.0)
Monocytes Relative: 6.8 % (ref 3.0–12.0)
Neutro Abs: 8.5 10*3/uL — ABNORMAL HIGH (ref 1.4–7.7)
Neutrophils Relative %: 78.6 % — ABNORMAL HIGH (ref 43.0–77.0)
Platelets: 398 10*3/uL (ref 150.0–400.0)
RBC: 4.98 Mil/uL (ref 3.87–5.11)
RDW: 17.7 % — ABNORMAL HIGH (ref 11.5–15.5)
WBC: 10.8 10*3/uL — ABNORMAL HIGH (ref 4.0–10.5)

## 2022-05-20 MED ORDER — FREESTYLE LIBRE 14 DAY SENSOR MISC
12 refills | Status: DC
  Filled 2022-05-20: qty 2, 28d supply, fill #0
  Filled 2022-06-04: qty 2, 28d supply, fill #1
  Filled 2022-07-10: qty 2, 28d supply, fill #2
  Filled 2022-07-30: qty 2, 28d supply, fill #3
  Filled 2022-08-30: qty 2, 28d supply, fill #4
  Filled 2022-10-09: qty 2, 28d supply, fill #5
  Filled 2022-11-09: qty 2, 28d supply, fill #6
  Filled 2022-12-12 (×2): qty 2, 28d supply, fill #7
  Filled 2023-01-04 – 2023-01-08 (×2): qty 2, 28d supply, fill #8

## 2022-05-20 MED ORDER — OZEMPIC (0.25 OR 0.5 MG/DOSE) 2 MG/3ML ~~LOC~~ SOPN: 0.2500 mg | PEN_INJECTOR | SUBCUTANEOUS | 0 refills | Status: DC

## 2022-05-21 ENCOUNTER — Encounter: Payer: Self-pay | Admitting: Physician Assistant

## 2022-05-21 LAB — PATHOLOGIST SMEAR REVIEW

## 2022-05-22 ENCOUNTER — Other Ambulatory Visit (INDEPENDENT_AMBULATORY_CARE_PROVIDER_SITE_OTHER): Payer: Managed Care, Other (non HMO)

## 2022-05-22 ENCOUNTER — Other Ambulatory Visit: Payer: Self-pay | Admitting: Physician Assistant

## 2022-05-22 DIAGNOSIS — R799 Abnormal finding of blood chemistry, unspecified: Secondary | ICD-10-CM

## 2022-05-22 LAB — IBC + FERRITIN
Ferritin: 10.7 ng/mL (ref 10.0–291.0)
Iron: 28 ug/dL — ABNORMAL LOW (ref 42–145)
Saturation Ratios: 5.3 % — ABNORMAL LOW (ref 20.0–50.0)
TIBC: 523.6 ug/dL — ABNORMAL HIGH (ref 250.0–450.0)
Transferrin: 374 mg/dL — ABNORMAL HIGH (ref 212.0–360.0)

## 2022-05-22 NOTE — Telephone Encounter (Signed)
Pt scheduled for 06/29

## 2022-05-26 LAB — HEMOGLOBINOPATHY EVALUATION
Fetal Hemoglobin Testing: <1 % (ref 0.0–1.9)
HCT: 39.3 % (ref 35.0–45.0)
Hemoglobin A2 - HGBRFX: 2 % — ABNORMAL LOW (ref 2.2–3.2)
Hemoglobin: 12.1 g/dL (ref 11.7–15.5)
Hgb A: 98 % (ref >96.0)
MCH: 24 pg — ABNORMAL LOW (ref 27.0–33.0)
MCV: 78 fL — ABNORMAL LOW (ref 80.0–100.0)
RBC: 5.04 10*6/uL (ref 3.80–5.10)
RDW: 16 % — ABNORMAL HIGH (ref 11.0–15.0)

## 2022-06-04 ENCOUNTER — Other Ambulatory Visit: Payer: Self-pay | Admitting: Physician Assistant

## 2022-06-04 ENCOUNTER — Encounter: Payer: Self-pay | Admitting: Physician Assistant

## 2022-06-04 ENCOUNTER — Other Ambulatory Visit (HOSPITAL_COMMUNITY): Payer: Self-pay

## 2022-06-04 MED ORDER — METFORMIN HCL ER 500 MG PO TB24
500.0000 mg | ORAL_TABLET | Freq: Every day | ORAL | 1 refills | Status: DC
  Filled 2022-06-04: qty 30, 30d supply, fill #0
  Filled 2022-07-30: qty 30, 30d supply, fill #1
  Filled 2022-08-30: qty 30, 30d supply, fill #2
  Filled 2022-10-09: qty 30, 30d supply, fill #3
  Filled 2022-11-09: qty 30, 30d supply, fill #4
  Filled 2022-12-12 (×2): qty 30, 30d supply, fill #5

## 2022-06-04 MED ORDER — OZEMPIC (0.25 OR 0.5 MG/DOSE) 2 MG/3ML ~~LOC~~ SOPN
0.5000 mg | PEN_INJECTOR | SUBCUTANEOUS | 1 refills | Status: DC
  Filled 2022-06-04 – 2022-06-11 (×2): qty 3, 28d supply, fill #0

## 2022-06-04 MED ORDER — DULOXETINE HCL 60 MG PO CPEP
60.0000 mg | ORAL_CAPSULE | Freq: Every day | ORAL | 1 refills | Status: DC
  Filled 2022-06-04: qty 30, 30d supply, fill #0
  Filled 2022-07-30: qty 30, 30d supply, fill #1
  Filled 2022-08-30: qty 30, 30d supply, fill #2
  Filled 2022-10-09: qty 30, 30d supply, fill #3
  Filled 2022-11-09: qty 30, 30d supply, fill #4
  Filled 2022-12-12 (×2): qty 30, 30d supply, fill #5

## 2022-06-06 ENCOUNTER — Telehealth: Payer: Self-pay | Admitting: *Deleted

## 2022-06-06 ENCOUNTER — Other Ambulatory Visit (HOSPITAL_COMMUNITY): Payer: Self-pay

## 2022-06-06 MED ORDER — AMPHETAMINE-DEXTROAMPHET ER 20 MG PO CP24
ORAL_CAPSULE | ORAL | 0 refills | Status: DC
  Filled 2022-06-06: qty 30, 30d supply, fill #0

## 2022-06-06 NOTE — Telephone Encounter (Signed)
Received fax from pharmacy PA needed for Ozempic 0.5 mg. PA done through Coverm[employer]eds. Awaiting determination.

## 2022-06-07 ENCOUNTER — Other Ambulatory Visit (HOSPITAL_COMMUNITY): Payer: Self-pay

## 2022-06-10 ENCOUNTER — Encounter: Payer: Self-pay | Admitting: *Deleted

## 2022-06-10 NOTE — Telephone Encounter (Signed)
Prior Authorization done again thru Covermymeds for Ozempic 0.5 mg due to pt is diabetic now.

## 2022-06-10 NOTE — Telephone Encounter (Signed)
Received fax from [facility] Ozempic has been denied.

## 2022-06-11 ENCOUNTER — Other Ambulatory Visit (HOSPITAL_COMMUNITY): Payer: Self-pay

## 2022-06-12 ENCOUNTER — Telehealth: Payer: Self-pay

## 2022-06-12 NOTE — Telephone Encounter (Signed)
[facility] has denied request for Ozempic 2mg /72mL.

## 2022-06-13 NOTE — Telephone Encounter (Signed)
It looks like Lupita Leash filed an appeal earlier this week. Waiting on insurance response.

## 2022-06-17 ENCOUNTER — Other Ambulatory Visit (HOSPITAL_COMMUNITY): Payer: Self-pay

## 2022-06-17 MED ORDER — TRULICITY 0.75 MG/0.5ML ~~LOC~~ SOAJ
0.7500 mg | SUBCUTANEOUS | 1 refills | Status: DC
  Filled 2022-06-17: qty 2, 28d supply, fill #0

## 2022-06-17 NOTE — Telephone Encounter (Signed)
Received determination Ozempic has been denied due to pt has not tried formulary alternatives Trulicity and Victoza.

## 2022-06-17 NOTE — Telephone Encounter (Signed)
Tanya Dorsey notified that pt PA for Ozempic has been denied due to pt has not tried formulary medications: Trulicity and Victoza. [name] said pt can try Trulicity 0.75 mg once a week.

## 2022-06-17 NOTE — Telephone Encounter (Signed)
Left message on voicemail to call office.  

## 2022-06-20 NOTE — Telephone Encounter (Signed)
Left message on voicemail to call office.  

## 2022-06-23 ENCOUNTER — Telehealth: Payer: Self-pay | Admitting: Physician Assistant

## 2022-06-23 NOTE — Telephone Encounter (Signed)
See other message

## 2022-06-23 NOTE — Telephone Encounter (Signed)
Patient states she was returning Tanya Dorsey's call from 07/27 and 07/28. States she believes it could be related to a medication she is trying to have approved but she is not sure. I informed patient that Lupita Leash will give her a call back.

## 2022-06-23 NOTE — Telephone Encounter (Signed)
Left detailed message on personal voicemail. PA for Ozempic was denied by insurance due to not trying formulary. [name] said for you to try Truclicity 0.75 mg once a week. Rx was sent to pharmacy. Any questions please call the office.

## 2022-06-26 ENCOUNTER — Other Ambulatory Visit (HOSPITAL_COMMUNITY): Payer: Self-pay

## 2022-06-27 ENCOUNTER — Other Ambulatory Visit (HOSPITAL_COMMUNITY): Payer: Self-pay

## 2022-06-27 MED ORDER — AMPHETAMINE-DEXTROAMPHET ER 15 MG PO CP24
ORAL_CAPSULE | Freq: Every day | ORAL | 0 refills | Status: DC
  Filled 2022-06-27: qty 30, 30d supply, fill #0

## 2022-07-03 ENCOUNTER — Other Ambulatory Visit (HOSPITAL_COMMUNITY): Payer: Self-pay

## 2022-07-03 MED ORDER — AMPHETAMINE-DEXTROAMPHET ER 20 MG PO CP24
20.0000 mg | ORAL_CAPSULE | Freq: Every day | ORAL | 0 refills | Status: DC
  Filled 2022-07-03 – 2022-07-05 (×2): qty 30, 30d supply, fill #0

## 2022-07-05 ENCOUNTER — Other Ambulatory Visit (HOSPITAL_COMMUNITY): Payer: Self-pay

## 2022-07-07 ENCOUNTER — Other Ambulatory Visit (HOSPITAL_COMMUNITY): Payer: Self-pay

## 2022-07-08 ENCOUNTER — Other Ambulatory Visit (HOSPITAL_COMMUNITY): Payer: Self-pay

## 2022-07-10 ENCOUNTER — Other Ambulatory Visit (HOSPITAL_COMMUNITY): Payer: Self-pay

## 2022-07-30 ENCOUNTER — Encounter: Payer: Self-pay | Admitting: Physician Assistant

## 2022-07-30 ENCOUNTER — Other Ambulatory Visit (HOSPITAL_COMMUNITY): Payer: Self-pay

## 2022-07-30 ENCOUNTER — Other Ambulatory Visit: Payer: Self-pay | Admitting: Physician Assistant

## 2022-07-30 DIAGNOSIS — E8881 Metabolic syndrome: Secondary | ICD-10-CM

## 2022-07-30 DIAGNOSIS — E559 Vitamin D deficiency, unspecified: Secondary | ICD-10-CM

## 2022-07-30 DIAGNOSIS — D649 Anemia, unspecified: Secondary | ICD-10-CM

## 2022-07-30 DIAGNOSIS — E538 Deficiency of other specified B group vitamins: Secondary | ICD-10-CM

## 2022-07-30 DIAGNOSIS — Z113 Encounter for screening for infections with a predominantly sexual mode of transmission: Secondary | ICD-10-CM

## 2022-07-31 ENCOUNTER — Other Ambulatory Visit (HOSPITAL_COMMUNITY): Payer: Self-pay

## 2022-08-05 ENCOUNTER — Other Ambulatory Visit (HOSPITAL_COMMUNITY): Payer: Self-pay

## 2022-08-06 ENCOUNTER — Other Ambulatory Visit (HOSPITAL_COMMUNITY): Payer: Self-pay

## 2022-08-06 MED ORDER — DULOXETINE HCL 20 MG PO CPEP
40.0000 mg | ORAL_CAPSULE | Freq: Every day | ORAL | 11 refills | Status: DC
  Filled 2022-08-06: qty 60, 30d supply, fill #0

## 2022-08-06 MED ORDER — AMPHETAMINE-DEXTROAMPHET ER 20 MG PO CP24
ORAL_CAPSULE | ORAL | 0 refills | Status: DC
  Filled 2022-08-30: qty 30, fill #0
  Filled 2022-09-07: qty 30, 30d supply, fill #0

## 2022-08-06 MED ORDER — AMPHETAMINE-DEXTROAMPHET ER 20 MG PO CP24
20.0000 mg | ORAL_CAPSULE | Freq: Every day | ORAL | 0 refills | Status: DC
  Filled 2022-10-09: qty 30, 30d supply, fill #0

## 2022-08-06 MED ORDER — AMPHETAMINE-DEXTROAMPHET ER 20 MG PO CP24
20.0000 mg | ORAL_CAPSULE | Freq: Every day | ORAL | 0 refills | Status: DC
  Filled 2022-08-06: qty 30, 30d supply, fill #0

## 2022-08-06 MED ORDER — BUPROPION HCL ER (SR) 150 MG PO TB12
150.0000 mg | ORAL_TABLET | Freq: Two times a day (BID) | ORAL | 11 refills | Status: DC
  Filled 2022-08-06: qty 60, 30d supply, fill #0
  Filled 2022-08-30: qty 60, 30d supply, fill #1
  Filled 2022-10-09: qty 60, 30d supply, fill #2
  Filled 2022-11-09: qty 60, 30d supply, fill #3
  Filled 2022-12-12 (×2): qty 60, 30d supply, fill #4
  Filled 2023-01-08 (×2): qty 60, 30d supply, fill #5
  Filled 2023-02-06: qty 60, 30d supply, fill #6
  Filled 2023-03-05: qty 60, 30d supply, fill #7
  Filled 2023-04-10: qty 60, 30d supply, fill #8
  Filled 2023-05-17: qty 60, 30d supply, fill #9
  Filled 2023-06-11: qty 60, 30d supply, fill #10
  Filled 2023-07-19: qty 60, 30d supply, fill #11

## 2022-08-08 ENCOUNTER — Other Ambulatory Visit (HOSPITAL_COMMUNITY): Payer: Self-pay

## 2022-08-11 ENCOUNTER — Other Ambulatory Visit (HOSPITAL_COMMUNITY): Payer: Self-pay

## 2022-08-13 ENCOUNTER — Other Ambulatory Visit (INDEPENDENT_AMBULATORY_CARE_PROVIDER_SITE_OTHER): Payer: 59

## 2022-08-13 DIAGNOSIS — E559 Vitamin D deficiency, unspecified: Secondary | ICD-10-CM

## 2022-08-13 DIAGNOSIS — E538 Deficiency of other specified B group vitamins: Secondary | ICD-10-CM

## 2022-08-13 DIAGNOSIS — D649 Anemia, unspecified: Secondary | ICD-10-CM

## 2022-08-13 DIAGNOSIS — R69 Illness, unspecified: Secondary | ICD-10-CM | POA: Diagnosis not present

## 2022-08-13 DIAGNOSIS — E88819 Insulin resistance, unspecified: Secondary | ICD-10-CM

## 2022-08-13 DIAGNOSIS — E8881 Metabolic syndrome: Secondary | ICD-10-CM

## 2022-08-13 DIAGNOSIS — Z23 Encounter for immunization: Secondary | ICD-10-CM | POA: Diagnosis not present

## 2022-08-13 DIAGNOSIS — Z113 Encounter for screening for infections with a predominantly sexual mode of transmission: Secondary | ICD-10-CM

## 2022-08-13 LAB — CBC WITH DIFFERENTIAL/PLATELET
Basophils Absolute: 0 10*3/uL (ref 0.0–0.1)
Basophils Relative: 0.3 % (ref 0.0–3.0)
Eosinophils Absolute: 0.1 10*3/uL (ref 0.0–0.7)
Eosinophils Relative: 1 % (ref 0.0–5.0)
HCT: 42.3 % (ref 36.0–46.0)
Hemoglobin: 13.8 g/dL (ref 12.0–15.0)
Lymphocytes Relative: 24.6 % (ref 12.0–46.0)
Lymphs Abs: 2.5 10*3/uL (ref 0.7–4.0)
MCHC: 32.6 g/dL (ref 30.0–36.0)
MCV: 83 fl (ref 78.0–100.0)
Monocytes Absolute: 1.1 10*3/uL — ABNORMAL HIGH (ref 0.1–1.0)
Monocytes Relative: 10.4 % (ref 3.0–12.0)
Neutro Abs: 6.5 10*3/uL (ref 1.4–7.7)
Neutrophils Relative %: 63.7 % (ref 43.0–77.0)
Platelets: 354 10*3/uL (ref 150.0–400.0)
RBC: 5.1 Mil/uL (ref 3.87–5.11)
RDW: 16.3 % — ABNORMAL HIGH (ref 11.5–15.5)
WBC: 10.2 10*3/uL (ref 4.0–10.5)

## 2022-08-13 LAB — COMPREHENSIVE METABOLIC PANEL
ALT: 23 U/L (ref 0–35)
AST: 13 U/L (ref 0–37)
Albumin: 4.1 g/dL (ref 3.5–5.2)
Alkaline Phosphatase: 89 U/L (ref 39–117)
BUN: 8 mg/dL (ref 6–23)
CO2: 26 mEq/L (ref 19–32)
Calcium: 9.3 mg/dL (ref 8.4–10.5)
Chloride: 101 mEq/L (ref 96–112)
Creatinine, Ser: 0.84 mg/dL (ref 0.40–1.20)
GFR: 88.88 mL/min (ref >60.00)
Glucose, Bld: 82 mg/dL (ref 70–99)
Potassium: 4.2 mEq/L (ref 3.5–5.1)
Sodium: 136 mEq/L (ref 135–145)
Total Bilirubin: 0.3 mg/dL (ref 0.2–1.2)
Total Protein: 7.4 g/dL (ref 6.0–8.3)

## 2022-08-13 LAB — IBC + FERRITIN
Ferritin: 14.4 ng/mL (ref 10.0–291.0)
Iron: 66 ug/dL (ref 42–145)
Saturation Ratios: 16.7 % — ABNORMAL LOW (ref 20.0–50.0)
TIBC: 394.8 ug/dL (ref 250.0–450.0)
Transferrin: 282 mg/dL (ref 212.0–360.0)

## 2022-08-13 LAB — HEMOGLOBIN A1C: Hgb A1c MFr Bld: 5.9 % (ref 4.6–6.5)

## 2022-08-13 LAB — VITAMIN B12: Vitamin B-12: 700 pg/mL (ref 211–911)

## 2022-08-13 LAB — VITAMIN D 25 HYDROXY (VIT D DEFICIENCY, FRACTURES): VITD: 60.42 ng/mL (ref 30.00–100.00)

## 2022-08-14 LAB — HIV ANTIBODY (ROUTINE TESTING W REFLEX): HIV 1&2 Ab, 4th Generation: NONREACTIVE

## 2022-08-14 LAB — RPR: RPR Ser Ql: NONREACTIVE

## 2022-08-14 NOTE — Telephone Encounter (Signed)
Noted, will address at upcoming appt

## 2022-08-18 ENCOUNTER — Encounter: Payer: Self-pay | Admitting: *Deleted

## 2022-08-19 ENCOUNTER — Encounter: Payer: Self-pay | Admitting: Physician Assistant

## 2022-08-19 ENCOUNTER — Other Ambulatory Visit (HOSPITAL_COMMUNITY)
Admission: RE | Admit: 2022-08-19 | Discharge: 2022-08-19 | Disposition: A | Discharge status: Home / Self Care | Payer: 59 | Source: Ambulatory Visit | Attending: Physician Assistant | Admitting: Physician Assistant

## 2022-08-19 ENCOUNTER — Ambulatory Visit (INDEPENDENT_AMBULATORY_CARE_PROVIDER_SITE_OTHER): Payer: 59 | Admitting: Physician Assistant

## 2022-08-19 VITALS — BP 126/80 | HR 109 | Temp 98.0°F | Ht 65.0 in | Wt 279.2 lb

## 2022-08-19 DIAGNOSIS — R69 Illness, unspecified: Secondary | ICD-10-CM | POA: Diagnosis not present

## 2022-08-19 DIAGNOSIS — E119 Type 2 diabetes mellitus without complications: Secondary | ICD-10-CM | POA: Diagnosis not present

## 2022-08-19 DIAGNOSIS — Z23 Encounter for immunization: Secondary | ICD-10-CM | POA: Diagnosis not present

## 2022-08-19 DIAGNOSIS — Z113 Encounter for screening for infections with a predominantly sexual mode of transmission: Secondary | ICD-10-CM | POA: Diagnosis not present

## 2022-08-19 MED ORDER — RYBELSUS 3 MG PO TABS: 3.0000 mg | ORAL_TABLET | Freq: Every day | ORAL | 0 refills | Status: DC

## 2022-08-19 MED ORDER — RYBELSUS 7 MG PO TABS: 7.0000 mg | ORAL_TABLET | Freq: Every day | ORAL | 0 refills | Status: DC

## 2022-08-19 NOTE — Addendum Note (Signed)
Addended by: Marian Sorrow on: 08/19/2022 03:47 PM   Modules accepted: Orders

## 2022-08-19 NOTE — Progress Notes (Signed)
Tanya Dorsey is a 36 y.o. female here for a follow up of a pre-existing problem.  History of Present Illness:   Chief Complaint  Patient presents with   Discuss lab results    HPI  Elevated blood sugars 3 month follow-up. Current DM meds: metformin 500 mg XR daily and Ozempic 0.5 mg weekly. She is not sure she can stay on Ozempic due to cost. Blood sugars at home are: on average 108. Patient is compliant with medications. Denies: hypoglycemic or hyperglycemic episodes or symptoms.   Lab Results  Component Value Date   HGBA1C 5.9 08/13/2022   STI screening She had HIV and RPR checked and these were negative. Unfortunately they were not able to do a urine test when she was last here and she would like screening for gonorrhea, chlamydia and trich.    Past Medical History:  Diagnosis Date   Allergy    Anxiety    Arthritis    Cholestasis of pregnancy    Depression    GERD (gastroesophageal reflux disease)    Gestational diabetes mellitus, antepartum    Heart murmur    Hypertension      Social History   Tobacco Use   Smoking status: Never   Smokeless tobacco: Never  Vaping Use   Vaping Use: Never used  Substance Use Topics   Alcohol use: No   Drug use: No    Past Surgical History:  Procedure Laterality Date   CESAREAN SECTION N/A 08/02/2014   Procedure: CESAREAN SECTION;  Surgeon: Alwyn Pea, MD;  Location: [facility];  Service: Obstetrics;  Laterality: N/A;   CHOLECYSTECTOMY     WISDOM TOOTH EXTRACTION      Family History  Problem Relation Age of Onset   Diabetes Mother    Hypertension Mother    Thyroid disease Mother    Hypertension Father    Thyroid cancer Neg Hx     No Known Allergies  Current Medications:   Current Outpatient Medications:    amLODipine (NORVASC) 2.5 MG tablet, Take 1 tablet (2.5 mg total) by mouth daily., Disp: 90 tablet, Rfl: 1   amphetamine-dextroamphetamine (ADDERALL XR) 20 MG 24 hr capsule, Take 1 capsule by mouth once daily,  do not fill until 09/04/2022, Disp: 30 capsule, Rfl: 0   amphetamine-dextroamphetamine (ADDERALL XR) 20 MG 24 hr capsule, Take 1 capsule by mouth once daily, do not fill until 10/04/2022, Disp: 30 capsule, Rfl: 0   amphetamine-dextroamphetamine (ADDERALL XR) 20 MG 24 hr capsule, Take 1 capsule by mouth once daily., Disp: 30 capsule, Rfl: 0   buPROPion (WELLBUTRIN SR) 150 MG 12 hr tablet, Take 1 tablet (150 mg total) by mouth 2 (two) times daily., Disp: 60 tablet, Rfl: 11   Continuous Blood Gluc Sensor (FREESTYLE LIBRE 14 DAY SENSOR) MISC, Apply one 14-day sensor as directed and replace after 14 days., Disp: 2 each, Rfl: 12   DULoxetine (CYMBALTA) 60 MG capsule, Take 1 capsule by mouth daily., Disp: 90 capsule, Rfl: 1   metFORMIN (GLUCOPHAGE-XR) 500 MG 24 hr tablet, Take 1 tablet by mouth daily., Disp: 90 tablet, Rfl: 1   PARAGARD INTRAUTERINE COPPER IU, by Intrauterine route. Inserted 2 yrs ago at [facility]., Disp: , Rfl:    Semaglutide,0.25 or 0.5MG /DOS, (OZEMPIC, 0.25 OR 0.5 MG/DOSE,) 2 MG/3ML SOPN, Inject 0.5 mg into the skin once a week., Disp: , Rfl:    Dulaglutide (TRULICITY) 9.74 BU/3.8GT SOPN, Inject 1 pen (0.75 mg) into the skin once a week. (Patient not taking:  Reported on 08/19/2022), Disp: 2 mL, Rfl: 1   Review of Systems:   ROS Negative unless otherwise specified per HPI.  Vitals:   Vitals:   08/19/22 1320  BP: 126/80  Pulse: (!) 109  Temp: 98 F (36.7 C)  TempSrc: Temporal  SpO2: 98%  Weight: 279 lb 4 oz (126.7 kg)  Height: 5\' 5"  (1.651 m)     Body mass index is 46.47 kg/m.  Physical Exam:   Physical Exam Vitals and nursing note reviewed.  Constitutional:      General: She is not in acute distress.    Appearance: She is well-developed. She is not ill-appearing or toxic-appearing.  Cardiovascular:     Rate and Rhythm: Normal rate and regular rhythm.     Pulses: Normal pulses.     Heart sounds: Normal heart sounds, S1 normal and S2 normal.  Pulmonary:      Effort: Pulmonary effort is normal.     Breath sounds: Normal breath sounds.  Skin:    General: Skin is warm and dry.  Neurological:     Mental Status: She is alert.     GCS: GCS eye subscore is 4. GCS verbal subscore is 5. GCS motor subscore is 6.  Psychiatric:        Speech: Speech normal.        Behavior: Behavior normal. Behavior is cooperative.     Assessment and Plan:   Screening examination for STD (sexually transmitted disease) Vaginal self swab completed today  Diabetes mellitus without complication (HCC) Doing well Will stop Ozempic due to cost Sample of Rybelsus 3 mg x 30 days given Written rx of Ryblesus 7 mg given for 90 days should she tolerate this Follow-up in 6 mo, sooner if concerns  Need for immunization against influenza Completed today  , PA-C

## 2022-08-19 NOTE — Patient Instructions (Signed)
It was great to see you!  Start 3 mg Rybelsus for 30 days I am printing off the next dose, 7 mg, for you to take after this has been completed  I will be in touch with your results.  Take care,  Inda Coke PA-C

## 2022-08-20 LAB — CERVICOVAGINAL ANCILLARY ONLY
Chlamydia: NEGATIVE
Comment: NEGATIVE
Comment: NEGATIVE
Comment: NORMAL
Neisseria Gonorrhea: NEGATIVE
Trichomonas: NEGATIVE

## 2022-08-21 ENCOUNTER — Encounter: Payer: Self-pay | Admitting: Physician Assistant

## 2022-08-30 ENCOUNTER — Other Ambulatory Visit (HOSPITAL_COMMUNITY): Payer: Self-pay

## 2022-09-07 ENCOUNTER — Other Ambulatory Visit: Payer: Self-pay | Admitting: Physician Assistant

## 2022-09-08 ENCOUNTER — Other Ambulatory Visit (HOSPITAL_COMMUNITY): Payer: Self-pay

## 2022-09-08 MED ORDER — VITAMIN B-12 100 MCG PO TABS
100.0000 ug | ORAL_TABLET | Freq: Every day | ORAL | 3 refills | Status: DC
  Filled 2022-09-08: qty 90, 90d supply, fill #0

## 2022-09-09 ENCOUNTER — Other Ambulatory Visit (HOSPITAL_COMMUNITY): Payer: Self-pay

## 2022-10-09 ENCOUNTER — Other Ambulatory Visit (HOSPITAL_COMMUNITY): Payer: Self-pay

## 2022-10-09 ENCOUNTER — Other Ambulatory Visit: Payer: Self-pay | Admitting: Physician Assistant

## 2022-10-09 MED ORDER — AMLODIPINE BESYLATE 2.5 MG PO TABS
2.5000 mg | ORAL_TABLET | Freq: Every day | ORAL | 1 refills | Status: DC
  Filled 2022-10-09: qty 30, 30d supply, fill #0
  Filled 2022-11-09: qty 30, 30d supply, fill #1
  Filled 2022-12-12 (×2): qty 30, 30d supply, fill #2
  Filled 2023-01-08 (×2): qty 30, 30d supply, fill #3
  Filled 2023-02-06: qty 30, 30d supply, fill #4
  Filled 2023-03-13: qty 30, 30d supply, fill #5

## 2022-10-23 ENCOUNTER — Other Ambulatory Visit (HOSPITAL_COMMUNITY): Payer: Self-pay

## 2022-10-23 MED ORDER — SYRINGE LUER SLIP 1 ML MISC
0 refills | Status: DC
  Filled 2022-10-23: qty 20, 140d supply, fill #0
  Filled 2022-11-09: qty 20, 20d supply, fill #0

## 2022-10-23 MED ORDER — "HYPODERMIC NEEDLE 25G X 5/8"" MISC"
0 refills | Status: DC
  Filled 2022-10-23: qty 20, 140d supply, fill #0

## 2022-10-23 MED ORDER — TESTOSTERONE CYPIONATE 200 MG/ML IM SOLN
50.0000 mg | INTRAMUSCULAR | 0 refills | Status: DC
  Filled 2022-10-23: qty 4, 28d supply, fill #0

## 2022-10-23 MED ORDER — "HYPODERMIC NEEDLE 18G X 1"" MISC"
0 refills | Status: DC
  Filled 2022-10-23: qty 20, 140d supply, fill #0

## 2022-10-24 ENCOUNTER — Other Ambulatory Visit (HOSPITAL_COMMUNITY): Payer: Self-pay

## 2022-10-24 MED ORDER — FINASTERIDE 1 MG PO TABS
1.0000 mg | ORAL_TABLET | Freq: Every day | ORAL | 0 refills | Status: DC
  Filled 2022-10-24: qty 30, 30d supply, fill #0
  Filled 2022-11-09: qty 30, 30d supply, fill #1

## 2022-10-30 DIAGNOSIS — M26609 Unspecified temporomandibular joint disorder, unspecified side: Secondary | ICD-10-CM | POA: Diagnosis not present

## 2022-10-30 DIAGNOSIS — H73891 Other specified disorders of tympanic membrane, right ear: Secondary | ICD-10-CM | POA: Diagnosis not present

## 2022-11-04 ENCOUNTER — Other Ambulatory Visit (HOSPITAL_COMMUNITY): Payer: Self-pay

## 2022-11-04 MED ORDER — AMPHETAMINE-DEXTROAMPHET ER 20 MG PO CP24
20.0000 mg | ORAL_CAPSULE | Freq: Every day | ORAL | 0 refills | Status: DC
  Filled 2023-03-13: qty 1, 1d supply, fill #0

## 2022-11-04 MED ORDER — AMPHETAMINE-DEXTROAMPHET ER 20 MG PO CP24: 20.0000 mg | ORAL_CAPSULE | Freq: Every day | ORAL | 0 refills | Status: DC

## 2022-11-04 MED ORDER — AMPHETAMINE-DEXTROAMPHET ER 20 MG PO CP24
20.0000 mg | ORAL_CAPSULE | Freq: Every day | ORAL | 0 refills | Status: DC
  Filled 2022-11-09 – 2022-11-11 (×2): qty 30, 30d supply, fill #0

## 2022-11-10 ENCOUNTER — Other Ambulatory Visit: Payer: Self-pay

## 2022-11-10 ENCOUNTER — Other Ambulatory Visit (HOSPITAL_COMMUNITY): Payer: Self-pay

## 2022-11-11 ENCOUNTER — Other Ambulatory Visit (HOSPITAL_COMMUNITY): Payer: Self-pay

## 2022-11-11 ENCOUNTER — Other Ambulatory Visit: Payer: Self-pay

## 2022-11-12 ENCOUNTER — Other Ambulatory Visit (HOSPITAL_COMMUNITY): Payer: Self-pay

## 2022-12-12 ENCOUNTER — Encounter: Payer: Self-pay | Admitting: Physician Assistant

## 2022-12-12 ENCOUNTER — Other Ambulatory Visit: Payer: Self-pay

## 2022-12-12 ENCOUNTER — Other Ambulatory Visit (HOSPITAL_COMMUNITY): Payer: Self-pay

## 2022-12-12 MED ORDER — AMPHETAMINE-DEXTROAMPHET ER 20 MG PO CP24
20.0000 mg | ORAL_CAPSULE | Freq: Every day | ORAL | 0 refills | Status: DC
  Filled 2022-12-12: qty 30, 30d supply, fill #0

## 2022-12-23 ENCOUNTER — Ambulatory Visit: Payer: 59 | Admitting: Physician Assistant

## 2022-12-30 ENCOUNTER — Ambulatory Visit: Payer: 59 | Admitting: Physician Assistant

## 2023-01-04 ENCOUNTER — Other Ambulatory Visit (HOSPITAL_COMMUNITY): Payer: Self-pay

## 2023-01-08 ENCOUNTER — Other Ambulatory Visit (HOSPITAL_COMMUNITY): Payer: Self-pay

## 2023-01-08 ENCOUNTER — Other Ambulatory Visit: Payer: Self-pay

## 2023-01-08 ENCOUNTER — Other Ambulatory Visit: Payer: Self-pay | Admitting: Physician Assistant

## 2023-01-08 MED ORDER — DULOXETINE HCL 60 MG PO CPEP
60.0000 mg | ORAL_CAPSULE | Freq: Every day | ORAL | 1 refills | Status: DC
  Filled 2023-01-08: qty 90, 90d supply, fill #0
  Filled 2023-02-06 – 2023-04-10 (×4): qty 90, 90d supply, fill #1

## 2023-01-08 MED ORDER — METFORMIN HCL ER 500 MG PO TB24
500.0000 mg | ORAL_TABLET | Freq: Every day | ORAL | 1 refills | Status: DC
  Filled 2023-01-08: qty 90, 90d supply, fill #0

## 2023-01-08 MED ORDER — FREESTYLE LIBRE 14 DAY SENSOR MISC
3 refills | Status: DC
  Filled 2023-01-08: qty 2, 28d supply, fill #0

## 2023-01-12 ENCOUNTER — Other Ambulatory Visit (HOSPITAL_COMMUNITY): Payer: Self-pay

## 2023-01-13 ENCOUNTER — Ambulatory Visit (INDEPENDENT_AMBULATORY_CARE_PROVIDER_SITE_OTHER): Payer: 59 | Admitting: Physician Assistant

## 2023-01-13 ENCOUNTER — Other Ambulatory Visit (HOSPITAL_COMMUNITY): Payer: Self-pay

## 2023-01-13 ENCOUNTER — Encounter: Payer: Self-pay | Admitting: Physician Assistant

## 2023-01-13 VITALS — BP 126/80 | HR 86 | Temp 98.0°F | Ht 65.0 in | Wt 276.2 lb

## 2023-01-13 DIAGNOSIS — R69 Illness, unspecified: Secondary | ICD-10-CM | POA: Diagnosis not present

## 2023-01-13 DIAGNOSIS — F988 Other specified behavioral and emotional disorders with onset usually occurring in childhood and adolescence: Secondary | ICD-10-CM

## 2023-01-13 DIAGNOSIS — F3342 Major depressive disorder, recurrent, in full remission: Secondary | ICD-10-CM

## 2023-01-13 DIAGNOSIS — E119 Type 2 diabetes mellitus without complications: Secondary | ICD-10-CM

## 2023-01-13 LAB — POCT GLYCOSYLATED HEMOGLOBIN (HGB A1C): Hemoglobin A1C: 5.9 % — AB (ref 4.0–5.6)

## 2023-01-13 MED ORDER — AMPHETAMINE-DEXTROAMPHET ER 20 MG PO CP24
20.0000 mg | ORAL_CAPSULE | ORAL | 0 refills | Status: DC
  Filled 2023-02-11 (×2): qty 30, 30d supply, fill #0

## 2023-01-13 MED ORDER — AMPHETAMINE-DEXTROAMPHET ER 20 MG PO CP24
20.0000 mg | ORAL_CAPSULE | ORAL | 0 refills | Status: DC
  Filled 2023-01-13: qty 30, 30d supply, fill #0

## 2023-01-13 MED ORDER — OZEMPIC (0.25 OR 0.5 MG/DOSE) 2 MG/3ML ~~LOC~~ SOPN
0.5000 mg | PEN_INJECTOR | SUBCUTANEOUS | 0 refills | Status: DC
  Filled 2023-01-13: qty 3, 28d supply, fill #0

## 2023-01-13 MED ORDER — AMPHETAMINE-DEXTROAMPHET ER 20 MG PO CP24
20.0000 mg | ORAL_CAPSULE | ORAL | 0 refills | Status: DC
  Filled 2023-03-16: qty 30, 30d supply, fill #0

## 2023-01-13 NOTE — Patient Instructions (Signed)
It was great to see you!  If you have handouts for your business -- feel free to drop by so I can share with patients!  Once we start Ozempic -- stop Metformin  Adderall refilled  Let's follow-up in 3-6 months for physical and adderall refill, sooner if you have concerns.  Take care,  Inda Coke PA-C

## 2023-01-13 NOTE — Progress Notes (Signed)
Tanya Dorsey is a 37 y.o. female here for follow-up of chronic medical issues.  History of Present Illness:   Chief Complaint  Patient presents with   ADD   Diabetes    Diabetes 6 month follow-up. Current DM meds: Rybelsus 7 mg daily and Metformin 500 mg XR daily. Her insurance is no longer paying for Rybelsus and she has not had it in 1 month or so. She is using free style libre at times - -but her insurance is also not paying for this. Patient is compliant with medications. Denies: hypoglycemic or hyperglycemic episodes or symptoms. This patient's diabetes is complicated by HTN.  Lab Results  Component Value Date   HGBA1C 5.9 (A) 01/13/2023   ADHD Diagnosed February 21, 2013 at [facility] Ritalin --> felt odd/anxious Adderall and Vyvanse has worked worked well for her Currently taking Adderall 20 mg XR daily -- she needs me to take over this rx as her psychiatrist is not a "medicaid provider"  Anxiety and Depression Currently taking Wellbutrin 150 mg SR BID -- usually forgets the second dosage Recently increased in Cymbalta 60 mg and doing well with this Ativan 0.5 mg daily prn -- very rare intake of this  HTN Currently taking amlodipine 2.5 mg. At home blood pressure readings are: regular. Patient denies chest pain, SOB, blurred vision, dizziness, unusual headaches, lower leg swelling. Patient is compliant with medication. Denies excessive caffeine intake, stimulant usage, excessive alcohol intake, or increase in salt consumption.  BP Readings from Last 3 Encounters:  01/13/23 126/80  08/19/22 126/80  05/20/22 130/80     Past Medical History:  Diagnosis Date   Allergy    Anxiety    Arthritis    Cholestasis of pregnancy    Depression    GERD (gastroesophageal reflux disease)    Gestational diabetes mellitus, antepartum    Heart murmur    Hypertension      Social History   Tobacco Use   Smoking status: Never   Smokeless tobacco: Never   Vaping Use   Vaping Use: Never used  Substance Use Topics   Alcohol use: No   Drug use: No    Past Surgical History:  Procedure Laterality Date   CESAREAN SECTION N/A 08/02/2014   Procedure: CESAREAN SECTION;  Surgeon: Alwyn Pea, MD;  Location: [facility];  Service: Obstetrics;  Laterality: N/A;   CHOLECYSTECTOMY     WISDOM TOOTH EXTRACTION      Family History  Problem Relation Age of Onset   Diabetes Mother    Hypertension Mother    Thyroid disease Mother    Hypertension Father    Thyroid cancer Neg Hx     No Known Allergies  Current Medications:   Current Outpatient Medications:    amLODipine (NORVASC) 2.5 MG tablet, Take 1 tablet (2.5 mg total) by mouth daily., Disp: 90 tablet, Rfl: 1   amphetamine-dextroamphetamine (ADDERALL XR) 20 MG 24 hr capsule, Take 1 capsule (20 mg total) by mouth daily., Disp: 1 capsule, Rfl: 0   amphetamine-dextroamphetamine (ADDERALL XR) 20 MG 24 hr capsule, Take 1 capsule (20 mg total) by mouth daily., Disp: 30 capsule, Rfl: 0   amphetamine-dextroamphetamine (ADDERALL XR) 20 MG 24 hr capsule, Take 1 capsule (20 mg total) by mouth every morning., Disp: 30 capsule, Rfl: 0   [START ON 02/12/2023] amphetamine-dextroamphetamine (ADDERALL XR) 20 MG 24 hr capsule, Take 1 capsule (20 mg total) by mouth every morning., Disp: 30 capsule, Rfl: 0   [START ON 03/14/2023]  amphetamine-dextroamphetamine (ADDERALL XR) 20 MG 24 hr capsule, Take 1 capsule (20 mg total) by mouth every morning., Disp: 30 capsule, Rfl: 0   buPROPion (WELLBUTRIN SR) 150 MG 12 hr tablet, Take 1 tablet (150 mg total) by mouth 2 (two) times daily., Disp: 60 tablet, Rfl: 11   cholecalciferol (VITAMIN D3) 25 MCG (1000 UNIT) tablet, Take 1,000 Units by mouth daily., Disp: , Rfl:    Continuous Blood Gluc Sensor (FREESTYLE LIBRE 14 DAY SENSOR) MISC, Apply one 14-day sensor as directed and replace after 14 days., Disp: 2 each, Rfl: 12   DULoxetine (CYMBALTA) 60 MG capsule, Take 1 capsule by mouth  daily., Disp: 90 capsule, Rfl: 1   ferrous sulfate (SLOW FE) 160 (50 Fe) MG TBCR SR tablet, Take by mouth daily., Disp: , Rfl:    LORazepam (ATIVAN) 0.5 MG tablet, Take 0.5 mg by mouth daily as needed., Disp: , Rfl:    metFORMIN (GLUCOPHAGE-XR) 500 MG 24 hr tablet, Take 1 tablet by mouth daily., Disp: 90 tablet, Rfl: 1   Needle, Disp, (HYPODERMIC NEEDLE 18GX1") 18G X 1" MISC, Use once per week, Disp: 20 each, Rfl: 0   Needle, Disp, (HYPODERMIC NEEDLE 25GX5/8") 25G X 5/8" MISC, Use once per week, Disp: 20 each, Rfl: 0   PARAGARD INTRAUTERINE COPPER IU, by Intrauterine route. Inserted 2 yrs ago at [facility]., Disp: , Rfl:    Semaglutide,0.25 or 0.5MG/DOS, (OZEMPIC, 0.25 OR 0.5 MG/DOSE,) 2 MG/3ML SOPN, Inject 0.5 mg into the skin once a week., Disp: 2 mL, Rfl: 0   Syringe, Disposable, (SYRINGE LUER SLIP) 1 ML MISC, Use as directed, Disp: 20 each, Rfl: 0   vitamin B-12 (CYANOCOBALAMIN) 100 MCG tablet, Take 1 tablet (100 mcg total) by mouth daily., Disp: 90 tablet, Rfl: 3   Review of Systems:   ROS Negative unless otherwise specified per HPI.  Vitals:   Vitals:   01/13/23 0814  BP: 126/80  Pulse: 86  Temp: 98 F (36.7 C)  TempSrc: Temporal  SpO2: 98%  Weight: 276 lb 4 oz (125.3 kg)  Height: 5' 5"$  (1.651 m)     Body mass index is 45.97 kg/m.  Physical Exam:   Physical Exam Vitals and nursing note reviewed.  Constitutional:      General: She is not in acute distress.    Appearance: She is well-developed. She is not ill-appearing or toxic-appearing.  Cardiovascular:     Rate and Rhythm: Normal rate and regular rhythm.     Pulses: Normal pulses.     Heart sounds: Normal heart sounds, S1 normal and S2 normal.  Pulmonary:     Effort: Pulmonary effort is normal.     Breath sounds: Normal breath sounds.  Skin:    General: Skin is warm and dry.  Neurological:     Mental Status: She is alert.     GCS: GCS eye subscore is 4. GCS verbal subscore is 5. GCS motor subscore is  6.  Psychiatric:        Speech: Speech normal.        Behavior: Behavior normal. Behavior is cooperative.     Assessment and Plan:   Diabetes mellitus without complication (HCC) 123456 is stable Restart Ozempic 0.5 mg weekly and stop metformin Follow-up in 3-6 months, sooner if concerns  Attention deficit disorder (ADD) without hyperactivity Well controlled PDMP reviewed - no red flags Continue Adderall 20 mg XR daily Follow-up in 3-6 months, sooner if concerns  Recurrent major depressive disorder, in full remission ([place])  No red flags Reviewed current rx -- she is going to continue to see psychiatry for this Denies SI/HI Follow-up with psychiatry, currently stable on regimen  Inda Coke, PA-C

## 2023-01-14 ENCOUNTER — Telehealth: Payer: Self-pay

## 2023-01-14 NOTE — Telephone Encounter (Signed)
Aldona Bar, PA was denied for Ozempic due to pt must try Formulary options first Trulicity or Victoza. Please advise.

## 2023-01-14 NOTE — Telephone Encounter (Signed)
Left message on voicemail to call office.  

## 2023-01-14 NOTE — Telephone Encounter (Signed)
PA Sent for patient for Ozempic and was denied due to patient needing to try another medication such as Trulicity first. Denial letter printed for record.

## 2023-01-15 NOTE — Telephone Encounter (Signed)
Left message on voicemail to call office.  

## 2023-01-16 NOTE — Telephone Encounter (Signed)
Patient returned call. Requests to be called. 

## 2023-01-20 ENCOUNTER — Encounter: Payer: Self-pay | Admitting: Radiology

## 2023-01-20 ENCOUNTER — Other Ambulatory Visit (HOSPITAL_COMMUNITY): Payer: Self-pay

## 2023-01-20 ENCOUNTER — Ambulatory Visit: Payer: Medicaid Other | Admitting: Radiology

## 2023-01-20 ENCOUNTER — Other Ambulatory Visit (HOSPITAL_BASED_OUTPATIENT_CLINIC_OR_DEPARTMENT_OTHER): Payer: Self-pay

## 2023-01-20 VITALS — BP 124/86 | Ht 65.25 in | Wt 273.0 lb

## 2023-01-20 DIAGNOSIS — N930 Postcoital and contact bleeding: Secondary | ICD-10-CM | POA: Diagnosis not present

## 2023-01-20 DIAGNOSIS — N898 Other specified noninflammatory disorders of vagina: Secondary | ICD-10-CM

## 2023-01-20 DIAGNOSIS — Z30431 Encounter for routine checking of intrauterine contraceptive device: Secondary | ICD-10-CM

## 2023-01-20 DIAGNOSIS — B3731 Acute candidiasis of vulva and vagina: Secondary | ICD-10-CM

## 2023-01-20 LAB — WET PREP FOR TRICH, YEAST, CLUE

## 2023-01-20 LAB — PREGNANCY, URINE: Preg Test, Ur: NEGATIVE

## 2023-01-20 MED ORDER — FLUCONAZOLE 150 MG PO TABS
150.0000 mg | ORAL_TABLET | ORAL | 0 refills | Status: DC
  Filled 2023-01-20: qty 3, 9d supply, fill #0

## 2023-01-20 NOTE — Telephone Encounter (Signed)
Spoke to pt told her Aldona Bar would like to start Trulicity [id number] mg since Ozempic was denied by insurance. Pt said insurance changed and she was able to get Ozempic and has started it. Told her okay.

## 2023-01-20 NOTE — Progress Notes (Signed)
      Subjective: Tanya Dorsey is a 37 y.o. female who complains of bleeding after intercourse, mid-cycle spotting,  recurrent vaginal infections before and after periods, currently with vaginal discharge, itching, no odor.  Symptoms x's 3 months, resolve after otc treatment, then return. Symptoms began 5 months ago around the same time she ran our of her DM meds.   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   -General: no acute distress -Vulva: without lesions or discharge -Vagina: discharge present, aptima swab and wet prep obtained -Cervix: no lesion or discharge, no CMT +IUD strings seen -Perineum: no lesions -Uterus: Mobile, non tender -Adnexa: no masses or tenderness   Microscopic wet-mount exam shows hyphae.   Chaperone offered and declined.  Assessment:/Plan:   1. PCB (post coital bleeding) - Pregnancy, urine; neg - SURESWAB CT/NG/T. vaginalis  2. Vaginal itching + yeast - WET PREP FOR TRICH, YEAST, CLUE - fluconazole (DIFLUCAN) 150 MG tablet; Take 1 tablet (150 mg total) by mouth every 3 (three) days.  Dispense: 3 tablet; Refill: 0  3. IUD check up Reassured string seen, no cervical friability    Will contact patient with results of testing completed today. Avoid intercourse until symptoms are resolved. Safe sex encouraged. Avoid the use of soaps or perfumed products in the peri area. Avoid tub baths and sitting in sweaty or wet clothing for prolonged periods of time.

## 2023-01-21 LAB — SURESWAB CT/NG/T. VAGINALIS
C. trachomatis RNA, TMA: NOT DETECTED
N. gonorrhoeae RNA, TMA: NOT DETECTED
Trichomonas vaginalis RNA: NOT DETECTED

## 2023-01-22 ENCOUNTER — Encounter: Payer: Self-pay | Admitting: Physician Assistant

## 2023-01-22 ENCOUNTER — Other Ambulatory Visit (HOSPITAL_COMMUNITY): Payer: Self-pay

## 2023-01-22 MED ORDER — FREESTYLE LIBRE 2 SENSOR MISC
2 refills | Status: DC
  Filled 2023-01-22 – 2023-03-05 (×2): qty 2, 28d supply, fill #0
  Filled 2023-05-17 – 2023-07-19 (×2): qty 2, 28d supply, fill #1
  Filled 2023-11-19: qty 2, 28d supply, fill #2

## 2023-01-31 ENCOUNTER — Other Ambulatory Visit (HOSPITAL_COMMUNITY): Payer: Self-pay

## 2023-02-04 ENCOUNTER — Other Ambulatory Visit (HOSPITAL_COMMUNITY): Payer: Self-pay

## 2023-02-04 ENCOUNTER — Encounter: Payer: Self-pay | Admitting: Physician Assistant

## 2023-02-04 MED ORDER — SEMAGLUTIDE (1 MG/DOSE) 4 MG/3ML ~~LOC~~ SOPN
1.0000 mg | PEN_INJECTOR | SUBCUTANEOUS | 2 refills | Status: DC
  Filled 2023-02-04: qty 3, 28d supply, fill #0
  Filled 2023-03-13: qty 3, 28d supply, fill #1
  Filled 2023-04-10: qty 3, 28d supply, fill #2

## 2023-02-04 NOTE — Telephone Encounter (Signed)
Please see message and advise 

## 2023-02-06 ENCOUNTER — Other Ambulatory Visit (HOSPITAL_COMMUNITY): Payer: Self-pay

## 2023-02-06 ENCOUNTER — Ambulatory Visit: Payer: Medicaid Other | Admitting: Radiology

## 2023-02-06 ENCOUNTER — Other Ambulatory Visit: Payer: Self-pay | Admitting: Physician Assistant

## 2023-02-06 ENCOUNTER — Telehealth: Payer: Self-pay

## 2023-02-06 VITALS — BP 112/76 | Temp 99.2°F

## 2023-02-06 DIAGNOSIS — N6315 Unspecified lump in the right breast, overlapping quadrants: Secondary | ICD-10-CM

## 2023-02-06 NOTE — Progress Notes (Signed)
   Kawaii Goldinger Dec 10, 1985 [id number]   History:  37 y.o. G1P1 presents with complaints of right breast mass noticed 2 days ago  Gynecologic History Patient's last menstrual period was 01/26/2023 (exact date).   Contraception/Family planning: IUD  Obstetric History OB History  Gravida Para Term Preterm AB Living  1 1 1  0 0 1  SAB IAB Ectopic Multiple Live Births  0 0 0 0 1    # Outcome Date GA Lbr Len/2nd Weight Sex Delivery Anes PTL Lv  1 Term 08/02/14 [redacted]w[redacted]d  9 lb 6.4 oz (4.265 kg) F CS-LTranv Spinal  LIV     The following portions of the patient's history were reviewed and updated as appropriate: allergies, current medications, past family history, past medical history, past social history, past surgical history, and problem list.  Review of Systems Pertinent items noted in HPI and remainder of comprehensive ROS otherwise negative.   Past medical history, past surgical history, family history and social history were all reviewed and documented in the EPIC chart.   Exam:  Vitals:   02/06/23 1522  BP: 112/76  Temp: 99.2 F (37.3 C)  TempSrc: Oral   There is no height or weight on file to calculate BMI.  General appearance:  Normal Thyroid:  Symmetrical, normal in size, without palpable masses or nodularity. Respiratory  Auscultation:  Clear without wheezing or rhonchi Cardiovascular  Auscultation:  Regular rate, without rubs, murmurs or gallops  Edema/varicosities:  Not grossly evident Breasts: breasts appear normal, no suspicious masses, no skin or nipple changes or axillary nodes, left breast normal without mass, skin or nipple changes or axillary nodes, abnormal mass palpable right breast 9 oclock, 1cm, nonmobile.   Patient informed chaperone available to be present for breast exam. Patient has requested no chaperone to be present.   Assessment/Plan:   1. Mass overlapping multiple quadrants of right breast Diagnostic mammogram and ultrasound     Kalep Full B WHNP-BC 3:33 PM 02/06/2023

## 2023-02-06 NOTE — Telephone Encounter (Signed)
Jeddito @ [facility] has pt scheduled for 02/14/2023 @ 1150am.   Pt notified and voiced understanding. Will send to provider for final review and close encounter.

## 2023-02-06 NOTE — Telephone Encounter (Signed)
-----   Message from Kerry Dory, NP sent at 02/06/2023  3:31 PM EDT ----- Regarding: mammogram Please schedule diagnostic mammogram with u/s of right breast, non mobile 1cm mass at 9 oclock.

## 2023-02-06 NOTE — Telephone Encounter (Signed)
Please refuse Rx it was already sent.

## 2023-02-09 ENCOUNTER — Other Ambulatory Visit (HOSPITAL_COMMUNITY): Payer: Self-pay

## 2023-02-09 ENCOUNTER — Telehealth: Payer: Self-pay | Admitting: *Deleted

## 2023-02-09 NOTE — Telephone Encounter (Signed)
Received fax PA needed for Ozempic 1 mg. PA done thru Covermymeds. Awaiting determination. Records faxed to [phone]. KEY: [id number]

## 2023-02-10 ENCOUNTER — Other Ambulatory Visit (HOSPITAL_COMMUNITY): Payer: Self-pay

## 2023-02-10 NOTE — Progress Notes (Signed)
Tanya Dorsey is a 37 y.o. female here for a new problem.  History of Present Illness:   No chief complaint on file.   HPI  Reports experiencing ... Ear pain starting ...   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   Heart murmur    Hypertension    Prediabetes      Social History   Tobacco Use   Smoking status: Never    Passive exposure: Never   Smokeless tobacco: Never  Vaping Use   Vaping Use: Never used  Substance Use Topics   Alcohol use: No   Drug use: No    Past Surgical History:  Procedure Laterality Date   CESAREAN SECTION N/A 08/02/2014   Procedure: CESAREAN SECTION;  Surgeon: Alwyn Pea, MD;  Location: [facility];  Service: Obstetrics;  Laterality: N/A;   CHOLECYSTECTOMY     TONSILLECTOMY     WISDOM TOOTH EXTRACTION      Family History  Problem Relation Age of Onset   Diabetes Mother    Hypertension Mother    Thyroid disease Mother    Hypertension Father    Thyroid cancer Neg Hx     No Known Allergies  Current Medications:   Current Outpatient Medications:    amLODipine (NORVASC) 2.5 MG tablet, Take 1 tablet (2.5 mg total) by mouth daily., Disp: 90 tablet, Rfl: 1   amphetamine-dextroamphetamine (ADDERALL XR) 20 MG 24 hr capsule, Take 1 capsule (20 mg total) by mouth daily., Disp: 1 capsule, Rfl: 0   amphetamine-dextroamphetamine (ADDERALL XR) 20 MG 24 hr capsule, Take 1 capsule (20 mg total) by mouth daily., Disp: 30 capsule, Rfl: 0   amphetamine-dextroamphetamine (ADDERALL XR) 20 MG 24 hr capsule, Take 1 capsule by mouth every morning., Disp: 30 capsule, Rfl: 0   [START ON 02/12/2023] amphetamine-dextroamphetamine (ADDERALL XR) 20 MG 24 hr capsule, Take 1 capsule (20 mg total) by mouth every morning., Disp: 30 capsule, Rfl: 0   [START ON  03/14/2023] amphetamine-dextroamphetamine (ADDERALL XR) 20 MG 24 hr capsule, Take 1 capsule (20 mg total) by mouth every morning., Disp: 30 capsule, Rfl: 0   buPROPion (WELLBUTRIN SR) 150 MG 12 hr tablet, Take 1 tablet (150 mg total) by mouth 2 (two) times daily., Disp: 60 tablet, Rfl: 11   cholecalciferol (VITAMIN D3) 25 MCG (1000 UNIT) tablet, Take 1,000 Units by mouth daily., Disp: , Rfl:    Continuous Blood Gluc Sensor (FREESTYLE LIBRE 2 SENSOR) MISC, Apply to skin and change every 14 days, Disp: 2 each, Rfl: 2   DULoxetine (CYMBALTA) 60 MG capsule, Take 1 capsule by mouth daily. (Patient not taking: Reported on 02/06/2023), Disp: 90 capsule, Rfl: 1   ferrous sulfate (SLOW FE) 160 (50 Fe) MG TBCR SR tablet, Take by mouth daily., Disp: , Rfl:    fluconazole (DIFLUCAN) 150 MG tablet, Take 1 tablet (150 mg total) by mouth every 3 (three) days., Disp: 3 tablet, Rfl: 0   LORazepam (ATIVAN) 0.5 MG tablet, Take 0.5 mg by mouth daily as needed., Disp: , Rfl:    PARAGARD INTRAUTERINE COPPER IU, by Intrauterine route. Inserted 2 yrs ago at [facility]. (2022), Disp: , Rfl:    Semaglutide, 1 MG/DOSE, 4 MG/3ML SOPN, Inject  1 mg once a week as directed., Disp: 3 mL, Rfl: 2   vitamin B-12 (CYANOCOBALAMIN) 100 MCG tablet, Take 1 tablet (100 mcg total) by mouth daily., Disp: 90 tablet, Rfl: 3   Review of Systems:   ROS  Vitals:   There were no vitals filed for this visit.   There is no height or weight on file to calculate BMI.  Physical Exam:   Physical Exam  Assessment and Plan:   ***   I,Alexander Ruley,acting as a scribe for Inda Coke, PA.,have documented all relevant documentation on the behalf of Inda Coke, PA,as directed by  Inda Coke, PA while in the presence of Inda Coke, [place].   ***  Inda Coke, PA-C

## 2023-02-11 ENCOUNTER — Other Ambulatory Visit: Payer: Self-pay

## 2023-02-11 ENCOUNTER — Other Ambulatory Visit (HOSPITAL_COMMUNITY): Payer: Self-pay

## 2023-02-11 ENCOUNTER — Ambulatory Visit: Payer: Medicaid Other | Admitting: Physician Assistant

## 2023-02-11 ENCOUNTER — Encounter: Payer: Self-pay | Admitting: Physician Assistant

## 2023-02-11 VITALS — BP 126/94 | HR 97 | Temp 97.7°F | Resp 16 | Ht 64.5 in | Wt 275.4 lb

## 2023-02-11 DIAGNOSIS — H9203 Otalgia, bilateral: Secondary | ICD-10-CM | POA: Diagnosis not present

## 2023-02-11 DIAGNOSIS — E119 Type 2 diabetes mellitus without complications: Secondary | ICD-10-CM | POA: Diagnosis not present

## 2023-02-11 MED ORDER — AMOXICILLIN-POT CLAVULANATE 875-125 MG PO TABS
1.0000 | ORAL_TABLET | Freq: Two times a day (BID) | ORAL | 0 refills | Status: DC
  Filled 2023-02-11: qty 14, 7d supply, fill #0

## 2023-02-11 MED ORDER — CIPROFLOXACIN-DEXAMETHASONE 0.3-0.1 % OT SUSP
4.0000 [drp] | Freq: Two times a day (BID) | OTIC | 0 refills | Status: DC
  Filled 2023-02-11: qty 7.5, 19d supply, fill #0

## 2023-02-11 NOTE — Patient Instructions (Signed)
It was great to see you!  Start augmentin antibiotic  Use cipro dex in future if you have drainage or concerns for infection after completion of antibiotics  Follow-up as needed  Take care,  Inda Coke PA-C

## 2023-02-12 NOTE — Telephone Encounter (Signed)
PA for Ozempic 1 mg approved LMOVM advising patient that medication has been approved.

## 2023-02-14 ENCOUNTER — Ambulatory Visit
Admission: RE | Admit: 2023-02-14 | Discharge: 2023-02-14 | Disposition: A | Discharge status: Home / Self Care | Payer: Medicaid Other | Source: Ambulatory Visit | Attending: Radiology | Admitting: Radiology

## 2023-02-14 ENCOUNTER — Other Ambulatory Visit: Payer: Self-pay | Admitting: Radiology

## 2023-02-14 ENCOUNTER — Ambulatory Visit
Admission: RE | Admit: 2023-02-14 | Discharge: 2023-02-14 | Disposition: A | Discharge status: Home / Self Care | Payer: 59 | Source: Ambulatory Visit | Attending: Radiology | Admitting: Radiology

## 2023-02-14 DIAGNOSIS — N6315 Unspecified lump in the right breast, overlapping quadrants: Secondary | ICD-10-CM

## 2023-02-14 DIAGNOSIS — N6311 Unspecified lump in the right breast, upper outer quadrant: Secondary | ICD-10-CM | POA: Diagnosis not present

## 2023-02-14 DIAGNOSIS — R928 Other abnormal and inconclusive findings on diagnostic imaging of breast: Secondary | ICD-10-CM

## 2023-02-17 ENCOUNTER — Encounter: Payer: Self-pay | Admitting: Physician Assistant

## 2023-02-17 ENCOUNTER — Other Ambulatory Visit (HOSPITAL_COMMUNITY): Payer: Self-pay

## 2023-02-17 ENCOUNTER — Other Ambulatory Visit: Payer: Self-pay | Admitting: Physician Assistant

## 2023-02-17 MED ORDER — PREDNISONE 10 MG PO TABS
ORAL_TABLET | ORAL | 0 refills | Status: DC
  Filled 2023-02-17: qty 10, 5d supply, fill #0

## 2023-02-18 ENCOUNTER — Other Ambulatory Visit (HOSPITAL_COMMUNITY): Payer: Self-pay

## 2023-02-19 ENCOUNTER — Other Ambulatory Visit (HOSPITAL_COMMUNITY): Payer: Self-pay

## 2023-02-20 ENCOUNTER — Other Ambulatory Visit (HOSPITAL_COMMUNITY): Payer: Self-pay

## 2023-03-03 DIAGNOSIS — Z9622 Myringotomy tube(s) status: Secondary | ICD-10-CM | POA: Diagnosis not present

## 2023-03-05 ENCOUNTER — Other Ambulatory Visit: Payer: Self-pay | Admitting: Physician Assistant

## 2023-03-05 ENCOUNTER — Other Ambulatory Visit: Payer: Self-pay

## 2023-03-05 ENCOUNTER — Other Ambulatory Visit (HOSPITAL_COMMUNITY): Payer: Self-pay

## 2023-03-06 ENCOUNTER — Other Ambulatory Visit (HOSPITAL_COMMUNITY): Payer: Self-pay

## 2023-03-09 ENCOUNTER — Other Ambulatory Visit (HOSPITAL_COMMUNITY): Payer: Self-pay

## 2023-03-13 ENCOUNTER — Other Ambulatory Visit: Payer: Self-pay

## 2023-03-13 ENCOUNTER — Other Ambulatory Visit (HOSPITAL_COMMUNITY): Payer: Self-pay

## 2023-03-16 ENCOUNTER — Encounter: Payer: Self-pay | Admitting: Physician Assistant

## 2023-03-16 ENCOUNTER — Other Ambulatory Visit (HOSPITAL_COMMUNITY): Payer: Self-pay

## 2023-03-19 ENCOUNTER — Other Ambulatory Visit (HOSPITAL_COMMUNITY): Payer: Self-pay

## 2023-03-20 ENCOUNTER — Other Ambulatory Visit (HOSPITAL_COMMUNITY): Payer: Self-pay

## 2023-03-20 MED ORDER — ACCU-CHEK GUIDE CONTROL VI LIQD
3 refills | Status: DC
  Filled 2023-03-20: qty 1, fill #0

## 2023-03-20 MED ORDER — ACCU-CHEK SOFTCLIX LANCETS MISC
3 refills | Status: DC
  Filled 2023-03-20: qty 100, 25d supply, fill #0
  Filled 2023-03-27: qty 200, 34d supply, fill #0

## 2023-03-20 MED ORDER — ACCU-CHEK GUIDE VI STRP
ORAL_STRIP | 3 refills | Status: DC
  Filled 2023-03-20: qty 100, 25d supply, fill #0
  Filled 2023-03-27: qty 200, 34d supply, fill #0

## 2023-03-20 MED ORDER — ACCU-CHEK GUIDE W/DEVICE KIT
PACK | 0 refills | Status: DC
  Filled 2023-03-20 – 2023-03-27 (×2): qty 1, 30d supply, fill #0

## 2023-03-27 ENCOUNTER — Other Ambulatory Visit (HOSPITAL_COMMUNITY): Payer: Self-pay

## 2023-04-10 ENCOUNTER — Other Ambulatory Visit: Payer: Self-pay | Admitting: Physician Assistant

## 2023-04-10 ENCOUNTER — Other Ambulatory Visit (HOSPITAL_COMMUNITY): Payer: Self-pay

## 2023-04-10 ENCOUNTER — Other Ambulatory Visit: Payer: Self-pay

## 2023-04-10 MED ORDER — AMPHETAMINE-DEXTROAMPHET ER 20 MG PO CP24
20.0000 mg | ORAL_CAPSULE | ORAL | 0 refills | Status: DC
  Filled 2023-04-10 – 2023-04-15 (×2): qty 30, 30d supply, fill #0

## 2023-04-10 MED ORDER — AMLODIPINE BESYLATE 2.5 MG PO TABS
2.5000 mg | ORAL_TABLET | Freq: Every day | ORAL | 1 refills | Status: DC
  Filled 2023-04-10 – 2023-05-17 (×2): qty 90, 90d supply, fill #0

## 2023-04-10 NOTE — Telephone Encounter (Signed)
Pt requesting refill for Adderall XR 20 mg & Amlodipine 2.5 mg. Last OV 02/11/2023.

## 2023-04-15 ENCOUNTER — Other Ambulatory Visit (HOSPITAL_COMMUNITY): Payer: Self-pay

## 2023-04-18 ENCOUNTER — Other Ambulatory Visit (HOSPITAL_COMMUNITY): Payer: Self-pay

## 2023-05-17 ENCOUNTER — Other Ambulatory Visit: Payer: Self-pay | Admitting: Physician Assistant

## 2023-05-18 ENCOUNTER — Other Ambulatory Visit (HOSPITAL_COMMUNITY): Payer: Self-pay

## 2023-05-18 ENCOUNTER — Other Ambulatory Visit: Payer: Self-pay

## 2023-05-18 NOTE — Telephone Encounter (Signed)
Pt requesting refills for Adderall XR 20 mg. Last OV 02/11/2023. Has an upcoming appt 6/25.

## 2023-05-19 ENCOUNTER — Encounter: Payer: Self-pay | Admitting: Physician Assistant

## 2023-05-19 ENCOUNTER — Other Ambulatory Visit: Payer: Self-pay

## 2023-05-19 ENCOUNTER — Other Ambulatory Visit (HOSPITAL_COMMUNITY): Payer: Self-pay

## 2023-05-19 ENCOUNTER — Ambulatory Visit: Payer: Medicaid Other | Admitting: Physician Assistant

## 2023-05-19 VITALS — BP 120/74 | HR 90 | Temp 97.7°F | Ht 64.5 in | Wt 268.0 lb

## 2023-05-19 DIAGNOSIS — F3342 Major depressive disorder, recurrent, in full remission: Secondary | ICD-10-CM

## 2023-05-19 DIAGNOSIS — R030 Elevated blood-pressure reading, without diagnosis of hypertension: Secondary | ICD-10-CM

## 2023-05-19 DIAGNOSIS — Z7985 Long-term (current) use of injectable non-insulin antidiabetic drugs: Secondary | ICD-10-CM

## 2023-05-19 DIAGNOSIS — F988 Other specified behavioral and emotional disorders with onset usually occurring in childhood and adolescence: Secondary | ICD-10-CM

## 2023-05-19 DIAGNOSIS — E119 Type 2 diabetes mellitus without complications: Secondary | ICD-10-CM

## 2023-05-19 MED ORDER — AMPHETAMINE-DEXTROAMPHET ER 20 MG PO CP24: 20.0000 mg | ORAL_CAPSULE | ORAL | 0 refills | Status: DC

## 2023-05-19 MED ORDER — AMPHETAMINE-DEXTROAMPHET ER 20 MG PO CP24
20.0000 mg | ORAL_CAPSULE | ORAL | 0 refills | Status: DC
  Filled 2023-05-19: qty 30, 30d supply, fill #0

## 2023-05-19 MED ORDER — SEMAGLUTIDE (2 MG/DOSE) 8 MG/3ML ~~LOC~~ SOPN
2.0000 mg | PEN_INJECTOR | SUBCUTANEOUS | 1 refills | Status: DC
  Filled 2023-05-19: qty 3, 28d supply, fill #0
  Filled 2023-06-11: qty 3, 28d supply, fill #1

## 2023-05-19 MED ORDER — DULOXETINE HCL 20 MG PO CPEP
40.0000 mg | ORAL_CAPSULE | Freq: Every day | ORAL | 1 refills | Status: DC
  Filled 2023-05-19: qty 180, 90d supply, fill #0
  Filled 2023-06-11 – 2023-07-19 (×2): qty 180, 90d supply, fill #1

## 2023-05-19 NOTE — Progress Notes (Signed)
Tanya Dorsey is a 37 y.o. female here for a follow up of a pre-existing problem.  History of Present Illness:   Chief Complaint  Patient presents with   Anxiety   Depression    Pt would like to discuss decreasing dose of Cymbalta.   Hypertension    She has not been checking her blood pressure. May Bp was 124/84 day she gave blood.    Depression/Anxiety: She is compliant with 60 mg Cymbalta daily and 150 mg Wellbutrin twice daily.  She is interested in decreasing her Cymbalta to 40 mg due to possible sexual side effects.  HTN: Her blood pressure is well controlled.  She is compliant with 2.5 mg Amlodipine.  She endorses occasional lightheadedness when standing up too quickly.  Reports she drinks plenty water throughout the day, tends to carry water bottle with her. She has not been monitoring her blood pressure at home, but she has 2 monitors at home and is open to starting a BP log.  BP Readings from Last 3 Encounters:  05/19/23 120/74  02/11/23 (!) 126/94  02/06/23 112/76   ADHD: She has been compliant with 20 mg Adderall XR, tolerating well.   Diabetes 4 month follow-up. Current DM meds: Ozempic 1 mg weekly. Blood sugars at home are: not checked. Patient is compliant with medications. Denies: hypoglycemic or hyperglycemic episodes or symptoms.   Lab Results  Component Value Date   HGBA1C 5.9 (A) 01/13/2023     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   Heart murmur    Hypertension    Prediabetes      Social History   Tobacco Use   Smoking status: Never    Passive exposure: Never   Smokeless tobacco: Never  Vaping Use   Vaping Use: Never used  Substance Use Topics   Alcohol use: No   Drug use: No    Past Surgical History:  Procedure Laterality  Date   CESAREAN SECTION N/A 08/02/2014   Procedure: CESAREAN SECTION;  Surgeon: Esmeralda Arthur, MD;  Location: [facility];  Service: Obstetrics;  Laterality: N/A;   CHOLECYSTECTOMY     TONSILLECTOMY     WISDOM TOOTH EXTRACTION      Family History  Problem Relation Age of Onset   Diabetes Mother    Hypertension Mother    Thyroid disease Mother    Hypertension Father    Thyroid cancer Neg Hx     No Known Allergies  Current Medications:   Current Outpatient Medications:    Accu-Chek Softclix Lancets lancets, USE TO CHECK BLOOD SUGARS THREE TO FOUR TIMES A DAY, Disp: 200 each, Rfl: 3   amphetamine-dextroamphetamine (ADDERALL XR) 20 MG 24 hr capsule, Take 1 capsule (20 mg total) by mouth daily., Disp: 1 capsule, Rfl: 0   amphetamine-dextroamphetamine (ADDERALL XR) 20 MG 24 hr capsule, Take 1 capsule (20 mg total) by mouth daily., Disp: 30 capsule, Rfl: 0   Blood Glucose Calibration (ACCU-CHEK GUIDE CONTROL) LIQD, USE TO CHECK TEST STRIPS AS DIRECTED, Disp: 1 each, Rfl: 3   Blood Glucose Monitoring Suppl (ACCU-CHEK GUIDE) w/Device KIT, USE TO CHECK BLOOD SUGARS, Disp: 1 kit, Rfl: 0   buPROPion (WELLBUTRIN SR) 150 MG 12 hr tablet,  Take 1 tablet (150 mg total) by mouth 2 (two) times daily., Disp: 60 tablet, Rfl: 11   cholecalciferol (VITAMIN D3) 25 MCG (1000 UNIT) tablet, Take 1,000 Units by mouth daily., Disp: , Rfl:    Continuous Glucose Sensor (FREESTYLE LIBRE 2 SENSOR) MISC, Apply to skin and change every 14 days, Disp: 2 each, Rfl: 2   DULoxetine (CYMBALTA) 60 MG capsule, Take 1 capsule by mouth daily., Disp: 90 capsule, Rfl: 1   ferrous sulfate (SLOW FE) 160 (50 Fe) MG TBCR SR tablet, Take by mouth daily., Disp: , Rfl:    glucose blood (ACCU-CHEK GUIDE) test strip, USE TO CHECK BLOOD SUGARS THREE TO FOUR TIMES A DAY., Disp: 200 each, Rfl: 3   LORazepam (ATIVAN) 0.5 MG tablet, Take 0.5 mg by mouth daily as needed., Disp: , Rfl:    PARAGARD INTRAUTERINE COPPER IU, by Intrauterine route.  Inserted 2 yrs ago at [facility]. (2022), Disp: , Rfl:    Semaglutide, 1 MG/DOSE, 4 MG/3ML SOPN, Inject 1 mg once a week as directed., Disp: 3 mL, Rfl: 2   vitamin B-12 (CYANOCOBALAMIN) 100 MCG tablet, Take 1 tablet (100 mcg total) by mouth daily., Disp: 90 tablet, Rfl: 3   amphetamine-dextroamphetamine (ADDERALL XR) 20 MG 24 hr capsule, Take 1 capsule (20 mg total) by mouth every morning., Disp: 30 capsule, Rfl: 0   amphetamine-dextroamphetamine (ADDERALL XR) 20 MG 24 hr capsule, Take 1 capsule (20 mg total) by mouth every morning., Disp: 30 capsule, Rfl: 0   amphetamine-dextroamphetamine (ADDERALL XR) 20 MG 24 hr capsule, Take 1 capsule by mouth every morning., Disp: 30 capsule, Rfl: 0   Review of Systems:   Review of Systems  Psychiatric/Behavioral:  Positive for depression. The patient is nervous/anxious.     Vitals:   Vitals:   05/19/23 0811  BP: 120/74  Pulse: 90  Temp: 97.7 F (36.5 C)  TempSrc: Temporal  SpO2: 96%  Weight: 268 lb (121.6 kg)  Height: 5' 4.5" (1.638 m)     Body mass index is 45.29 kg/m.  Physical Exam:   Physical Exam Vitals and nursing note reviewed.  Constitutional:      General: She is not in acute distress.    Appearance: She is well-developed. She is not ill-appearing or toxic-appearing.  Cardiovascular:     Rate and Rhythm: Normal rate and regular rhythm.     Pulses: Normal pulses.     Heart sounds: Normal heart sounds, S1 normal and S2 normal.  Pulmonary:     Effort: Pulmonary effort is normal.     Breath sounds: Normal breath sounds.  Skin:    General: Skin is warm and dry.  Neurological:     Mental Status: She is alert.     GCS: GCS eye subscore is 4. GCS verbal subscore is 5. GCS motor subscore is 6.  Psychiatric:        Speech: Speech normal.        Behavior: Behavior normal. Behavior is cooperative.     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 No red flags Continue Adderall 20 mg XR daily Follow-up in 3 month(s), sooner if concerns  Recurrent major depressive disorder, in full remission (HCC) Well controlled Continue Wellbutrin 150 mg  twice daily and  will decrease Cymbalta to 40 mg daily Reach out if ongoing sexual side effect(s) or other concerns Follow-up in 3 month(s), sooner if concerns  Elevated blood pressure reading Normotensive Advised as follows: "Hold amlodipine and measure blood pressure, message me if ongoing concerns with lightheadedness" Follow-up in 3 months, sooner if concerns   I,Rachel Rivera,acting as a scribe for [employer], PA.,have documented all relevant documentation on the behalf of Jarold Motto, PA,as directed by  Jarold Motto, PA while in the presence of Jarold Motto, [place].  I, Jarold Motto, [place], have reviewed all documentation for this visit. The documentation on 05/19/23 for the exam, diagnosis, procedures, and orders are all accurate and complete.   Jarold Motto, PA-C

## 2023-05-19 NOTE — Patient Instructions (Signed)
It was great to see you!  Hold amlodipine and measure blood pressure, message me if ongoing concerns with lightheadedness  Decrease Cymbalta to 40 mg daily, message me if ongoing concerns  Increase Ozempic to 2 mg daily  Continue Adderall  Let's follow-up in 4-6 months, sooner if you have concerns.  Take care,  Jarold Motto PA-C

## 2023-05-21 ENCOUNTER — Other Ambulatory Visit (HOSPITAL_COMMUNITY): Payer: Self-pay

## 2023-06-11 ENCOUNTER — Other Ambulatory Visit: Payer: Self-pay | Admitting: Physician Assistant

## 2023-06-11 ENCOUNTER — Other Ambulatory Visit: Payer: Self-pay

## 2023-06-11 ENCOUNTER — Other Ambulatory Visit (HOSPITAL_COMMUNITY): Payer: Self-pay

## 2023-06-11 MED ORDER — AMPHETAMINE-DEXTROAMPHET ER 20 MG PO CP24
20.0000 mg | ORAL_CAPSULE | ORAL | 0 refills | Status: DC
  Filled 2023-06-11 – 2023-06-24 (×2): qty 30, 30d supply, fill #0

## 2023-06-11 NOTE — Telephone Encounter (Signed)
Pt requesting refill for Adderall 20 mg. Last OV 04/2023.

## 2023-06-13 ENCOUNTER — Other Ambulatory Visit (HOSPITAL_COMMUNITY): Payer: Self-pay

## 2023-06-15 ENCOUNTER — Other Ambulatory Visit (HOSPITAL_COMMUNITY): Payer: Self-pay

## 2023-06-23 ENCOUNTER — Other Ambulatory Visit (HOSPITAL_COMMUNITY): Payer: Self-pay

## 2023-06-24 ENCOUNTER — Encounter (INDEPENDENT_AMBULATORY_CARE_PROVIDER_SITE_OTHER): Payer: Self-pay

## 2023-06-24 ENCOUNTER — Other Ambulatory Visit (HOSPITAL_COMMUNITY): Payer: Self-pay

## 2023-06-24 ENCOUNTER — Encounter: Payer: Self-pay | Admitting: Physician Assistant

## 2023-06-25 ENCOUNTER — Other Ambulatory Visit (HOSPITAL_COMMUNITY): Payer: Self-pay

## 2023-06-25 MED ORDER — FLUCONAZOLE 150 MG PO TABS
150.0000 mg | ORAL_TABLET | Freq: Once | ORAL | 0 refills | Status: AC
  Filled 2023-06-25: qty 1, 1d supply, fill #0

## 2023-06-25 NOTE — Telephone Encounter (Signed)
Okay to send in one Diflucan?

## 2023-07-13 NOTE — Progress Notes (Incomplete)
Subjective:    Tanya Dorsey is a 37 y.o. female and is here for a comprehensive physical exam.  HPI  Health Maintenance Due  Topic Date Due   FOOT EXAM  Never done   Diabetic kidney evaluation - Urine ACR  Never done   Hepatitis C Screening  Never done   INFLUENZA VACCINE  06/25/2023    Acute Concerns: ***  Chronic Issues: ***  Health Maintenance: Immunizations -- *** Colonoscopy -- N/A Mammogram -- Last done 02/14/23. Benign fibrocystic changes in right breast; no evidence of malignancy bilaterally.  PAP -- Last done 03/06/15 (03/05/18 rescheduled for 01/14/24). Results were normal.  Bone Density -- N/A Diet -- *** Exercise -- ***  Sleep habits -- *** Mood -- ***  UTD with dentist? - *** UTD with eye doctor? - ***  Weight history: Wt Readings from Last 10 Encounters:  05/19/23 268 lb (121.6 kg)  02/11/23 275 lb 6.4 oz (124.9 kg)  01/20/23 273 lb (123.8 kg)  01/13/23 276 lb 4 oz (125.3 kg)  08/19/22 279 lb 4 oz (126.7 kg)  05/20/22 297 lb 8 oz (134.9 kg)  05/14/22 296 lb 6.1 oz (134.4 kg)  11/10/19 281 lb (127.5 kg)  11/27/18 279 lb 3.2 oz (126.6 kg)  07/07/18 273 lb (123.8 kg)   There is no height or weight on file to calculate BMI. No LMP recorded. (Menstrual status: IUD).  Alcohol use:  reports no history of alcohol use.  Tobacco use:  Tobacco Use: Low Risk  (05/19/2023)   Patient History    Smoking Tobacco Use: Never    Smokeless Tobacco Use: Never    Passive Exposure: Never   Eligible for lung cancer screening? ***     05/19/2023    8:15 AM  Depression screen PHQ 2/9  Decreased Interest 1  Down, Depressed, Hopeless 1  PHQ - 2 Score 2  Altered sleeping 1  Tired, decreased energy 1  Change in appetite 1  Feeling bad or failure about yourself  0  Trouble concentrating 1  Moving slowly or fidgety/restless 0  Suicidal thoughts 0  PHQ-9 Score 6  Difficult doing work/chores Somewhat difficult     Other providers/specialists: Patient Care  Team: Jarold Motto, [place] as PCP - General (Physician Assistant) Myna Hidalgo, DO as Consulting Physician (Obstetrics and Gynecology) Tanda Rockers, NP as Nurse Practitioner (Radiology)    PMHx, SurgHx, SocialHx, Medications, and Allergies were reviewed in the Visit Navigator and updated as appropriate.   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   Heart murmur    Hypertension    Prediabetes      Past Surgical History:  Procedure Laterality Date   CESAREAN SECTION N/A 08/02/2014   Procedure: CESAREAN SECTION;  Surgeon: Esmeralda Arthur, MD;  Location: [facility];  Service: Obstetrics;  Laterality: N/A;   CHOLECYSTECTOMY     TONSILLECTOMY     WISDOM TOOTH EXTRACTION       Family History  Problem Relation Age of Onset   Diabetes Mother    Hypertension Mother    Thyroid disease Mother    Hypertension Father    Thyroid cancer Neg Hx     Social History   Tobacco Use  Smoking status: Never    Passive exposure: Never   Smokeless tobacco: Never  Vaping Use   Vaping status: Never Used  Substance Use Topics   Alcohol use: No   Drug use: No    Review of Systems:   ROS  Objective:   There were no vitals taken for this visit. There is no height or weight on file to calculate BMI.   General Appearance:    Alert, cooperative, no distress, appears stated age  Head:    Normocephalic, without obvious abnormality, atraumatic  Eyes:    PERRL, conjunctiva/corneas clear, EOM's intact, fundi    benign, both eyes  Ears:    Normal TM's and external ear canals, both ears  Nose:   Nares normal, septum midline, mucosa normal, no drainage    or sinus tenderness  Throat:   Lips, mucosa, and tongue normal; teeth and gums normal  Neck:   Supple, symmetrical, trachea  midline, no adenopathy;    thyroid:  no enlargement/tenderness/nodules; no carotid   bruit or JVD  Back:     Symmetric, no curvature, ROM normal, no CVA tenderness  Lungs:     Clear to auscultation bilaterally, respirations unlabored  Chest Wall:    No tenderness or deformity   Heart:    Regular rate and rhythm, S1 and S2 normal, no murmur, rub or gallop  Breast Exam:    ***No tenderness, masses, or nipple abnormality  Abdomen:     Soft, non-tender, bowel sounds active all four quadrants,    no masses, no organomegaly  Genitalia:    ***Normal female without lesion, discharge or tenderness  Extremities:   Extremities normal, atraumatic, no cyanosis or edema  Pulses:   2+ and symmetric all extremities  Skin:   Skin color, texture, turgor normal, no rashes or lesions  Lymph nodes:   Cervical, supraclavicular, and axillary nodes normal  Neurologic:   CNII-XII intact, normal strength, sensation and reflexes    throughout    Assessment/Plan:   There are no diagnoses linked to this encounter.  I,Emily Lagle,acting as a Neurosurgeon for [employer], PA.,have documented all relevant documentation on the behalf of Jarold Motto, PA,as directed by  Jarold Motto, PA while in the presence of Jarold Motto, [place].  *** Jarold Motto, PA-C  [facility]

## 2023-07-14 ENCOUNTER — Encounter: Payer: 59 | Admitting: Physician Assistant

## 2023-07-19 ENCOUNTER — Other Ambulatory Visit: Payer: Self-pay | Admitting: Physician Assistant

## 2023-07-19 ENCOUNTER — Other Ambulatory Visit (HOSPITAL_COMMUNITY): Payer: Self-pay

## 2023-07-20 NOTE — Telephone Encounter (Signed)
Pt requesting refill for Adderall XR 20 mg. Last OV 05/19/2023.

## 2023-07-21 ENCOUNTER — Other Ambulatory Visit (HOSPITAL_COMMUNITY): Payer: Self-pay

## 2023-07-21 ENCOUNTER — Other Ambulatory Visit: Payer: Self-pay | Admitting: Physician Assistant

## 2023-07-21 ENCOUNTER — Other Ambulatory Visit: Payer: Self-pay

## 2023-07-21 MED ORDER — OZEMPIC (2 MG/DOSE) 8 MG/3ML ~~LOC~~ SOPN
2.0000 mg | PEN_INJECTOR | SUBCUTANEOUS | 1 refills | Status: DC
  Filled 2023-07-21: qty 3, 28d supply, fill #0
  Filled 2023-08-22: qty 3, 28d supply, fill #1

## 2023-07-21 MED ORDER — AMPHETAMINE-DEXTROAMPHET ER 20 MG PO CP24
20.0000 mg | ORAL_CAPSULE | ORAL | 0 refills | Status: DC
  Filled 2023-07-21: qty 30, 30d supply, fill #0

## 2023-07-22 ENCOUNTER — Other Ambulatory Visit: Payer: Self-pay

## 2023-07-22 ENCOUNTER — Other Ambulatory Visit (HOSPITAL_COMMUNITY): Payer: Self-pay

## 2023-07-24 ENCOUNTER — Other Ambulatory Visit: Payer: Self-pay | Admitting: Physician Assistant

## 2023-07-24 ENCOUNTER — Other Ambulatory Visit (HOSPITAL_COMMUNITY): Payer: Self-pay

## 2023-07-24 ENCOUNTER — Encounter: Payer: Self-pay | Admitting: Physician Assistant

## 2023-07-24 ENCOUNTER — Other Ambulatory Visit: Payer: Self-pay

## 2023-07-24 MED ORDER — AMPHETAMINE-DEXTROAMPHET ER 25 MG PO CP24
25.0000 mg | ORAL_CAPSULE | ORAL | 0 refills | Status: DC
  Filled 2023-07-24 – 2023-07-25 (×2): qty 30, 30d supply, fill #0

## 2023-07-25 ENCOUNTER — Other Ambulatory Visit (HOSPITAL_BASED_OUTPATIENT_CLINIC_OR_DEPARTMENT_OTHER): Payer: Self-pay

## 2023-07-28 ENCOUNTER — Other Ambulatory Visit (HOSPITAL_COMMUNITY): Payer: Self-pay

## 2023-07-28 ENCOUNTER — Other Ambulatory Visit: Payer: Self-pay

## 2023-08-10 NOTE — Progress Notes (Signed)
Tanya Dorsey is a 37 y.o. female and is here for a comprehensive physical exam.  HPI  Health Maintenance Due  Topic Date Due   FOOT EXAM  Never done   Diabetic kidney evaluation - Urine ACR  Never done   Hepatitis C Screening  Never done   Cervical Cancer Screening (HPV/Pap Cotest)  Never done   INFLUENZA VACCINE  06/25/2023   COVID-19 Vaccine (6 - 2023-24 season) 07/26/2023   HEMOGLOBIN A1C  07/14/2023   Diabetic kidney evaluation - eGFR measurement  08/14/2023    Acute Concerns: ***  Chronic Issues: ***  Health Maintenance: Immunizations -- *** Colonoscopy -- N/A.  Mammogram -- Last done 02/14/23. Benign fibrocystic changes found in RIGHT breast; otherwise normal.  PAP -- Last done 03/08/15. Results were normal. Bone Density -- N/A.  Diet -- {CPE Diet/Exercise:30649} Exercise -- {CPE Diet/Exercise:30649}  Sleep habits -- {CPE Mood/Sleep:30650} Mood -- {CPE Mood/Sleep:30650}  UTD with dentist? - {Eye Doctor/Dentist:30651} UTD with eye doctor? - {Eye Doctor/Dentist:30651}  Weight history: Wt Readings from Last 10 Encounters:  05/19/23 268 lb (121.6 kg)  02/11/23 275 lb 6.4 oz (124.9 kg)  01/20/23 273 lb (123.8 kg)  01/13/23 276 lb 4 oz (125.3 kg)  08/19/22 279 lb 4 oz (126.7 kg)  05/20/22 297 lb 8 oz (134.9 kg)  05/14/22 296 lb 6.1 oz (134.4 kg)  11/10/19 281 lb (127.5 kg)  11/27/18 279 lb 3.2 oz (126.6 kg)  07/07/18 273 lb (123.8 kg)   There is no height or weight on file to calculate BMI. No LMP recorded. (Menstrual status: IUD).  Alcohol use:  reports no history of alcohol use.  Tobacco use:  Tobacco Use: Low Risk  (05/19/2023)   Patient History    Smoking Tobacco Use: Never    Smokeless Tobacco Use: Never    Passive Exposure: Never   Eligible for lung cancer screening? ***     05/19/2023    8:15 AM  Depression screen PHQ 2/9  Decreased Interest 1  Down, Depressed, Hopeless 1  PHQ - 2 Score 2  Altered sleeping 1  Tired, decreased energy 1   Change in appetite 1  Feeling bad or failure about yourself  0  Trouble concentrating 1  Moving slowly or fidgety/restless 0  Suicidal thoughts 0  PHQ-9 Score 6  Difficult doing work/chores Somewhat difficult     Other providers/specialists: Patient Care Team: Jarold Motto, [place] as PCP - General (Physician Assistant) Myna Hidalgo, DO as Consulting Physician (Obstetrics and Gynecology) Tanda Rockers, NP as Nurse Practitioner (Radiology)    PMHx, SurgHx, SocialHx, Medications, and Allergies were reviewed in the Visit Navigator and updated as appropriate.   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   Heart murmur    Hypertension    Prediabetes      Past Surgical History:  Procedure Laterality Date   CESAREAN SECTION N/A 08/02/2014   Procedure: CESAREAN SECTION;  Surgeon: Esmeralda Arthur, MD;  Location: [facility];  Service: Obstetrics;  Laterality: N/A;   CHOLECYSTECTOMY     TONSILLECTOMY     WISDOM TOOTH EXTRACTION       Family History  Problem Relation Age of Onset   Diabetes Mother  Hypertension Mother    Thyroid disease Mother    Hypertension Father    Thyroid cancer Neg Hx     Social History   Tobacco Use   Smoking status: Never    Passive exposure: Never   Smokeless tobacco: Never  Vaping Use   Vaping status: Never Used  Substance Use Topics   Alcohol use: No   Drug use: No    Review of Systems:   ROS  Objective:   There were no vitals taken for this visit. There is no height or weight on file to calculate BMI.   General Appearance:    Alert, cooperative, no distress, appears stated age  Head:    Normocephalic, without obvious abnormality, atraumatic  Eyes:    PERRL, conjunctiva/corneas clear, EOM's intact, fundi    benign,  both eyes  Ears:    Normal TM's and external ear canals, both ears  Nose:   Nares normal, septum midline, mucosa normal, no drainage    or sinus tenderness  Throat:   Lips, mucosa, and tongue normal; teeth and gums normal  Neck:   Supple, symmetrical, trachea midline, no adenopathy;    thyroid:  no enlargement/tenderness/nodules; no carotid   bruit or JVD  Back:     Symmetric, no curvature, ROM normal, no CVA tenderness  Lungs:     Clear to auscultation bilaterally, respirations unlabored  Chest Wall:    No tenderness or deformity   Heart:    Regular rate and rhythm, S1 and S2 normal, no murmur, rub or gallop  Breast Exam:    ***No tenderness, masses, or nipple abnormality  Abdomen:     Soft, non-tender, bowel sounds active all four quadrants,    no masses, no organomegaly  Genitalia:    ***Normal female without lesion, discharge or tenderness  Extremities:   Extremities normal, atraumatic, no cyanosis or edema  Pulses:   2+ and symmetric all extremities  Skin:   Skin color, texture, turgor normal, no rashes or lesions  Lymph nodes:   Cervical, supraclavicular, and axillary nodes normal  Neurologic:   CNII-XII intact, normal strength, sensation and reflexes    throughout    Assessment/Plan:   There are no diagnoses linked to this encounter.         I,Emily Lagle,acting as a Neurosurgeon for [employer], PA.,have documented all relevant documentation on the behalf of Jarold Motto, PA,as directed by  Jarold Motto, PA while in the presence of Jarold Motto, [place].  *** (refresh reminder)  I, Larey Brick, have reviewed all documentation for this visit. The documentation on 08/10/23 for the exam, diagnosis, procedures, and orders are all accurate and complete.  Jarold Motto, PA-C [place] [facility] [place] [facility]

## 2023-08-11 ENCOUNTER — Other Ambulatory Visit (HOSPITAL_COMMUNITY): Payer: Self-pay

## 2023-08-11 ENCOUNTER — Encounter: Payer: Self-pay | Admitting: Physician Assistant

## 2023-08-11 ENCOUNTER — Ambulatory Visit (INDEPENDENT_AMBULATORY_CARE_PROVIDER_SITE_OTHER): Payer: Medicaid Other | Admitting: Physician Assistant

## 2023-08-11 VITALS — BP 130/86 | HR 115 | Temp 97.7°F | Ht 64.5 in | Wt 269.0 lb

## 2023-08-11 DIAGNOSIS — Z7985 Long-term (current) use of injectable non-insulin antidiabetic drugs: Secondary | ICD-10-CM

## 2023-08-11 DIAGNOSIS — F3342 Major depressive disorder, recurrent, in full remission: Secondary | ICD-10-CM | POA: Diagnosis not present

## 2023-08-11 DIAGNOSIS — F988 Other specified behavioral and emotional disorders with onset usually occurring in childhood and adolescence: Secondary | ICD-10-CM

## 2023-08-11 DIAGNOSIS — Z0000 Encounter for general adult medical examination without abnormal findings: Secondary | ICD-10-CM | POA: Diagnosis not present

## 2023-08-11 DIAGNOSIS — Z1159 Encounter for screening for other viral diseases: Secondary | ICD-10-CM | POA: Diagnosis not present

## 2023-08-11 DIAGNOSIS — E119 Type 2 diabetes mellitus without complications: Secondary | ICD-10-CM

## 2023-08-11 LAB — CBC WITH DIFFERENTIAL/PLATELET
Basophils Absolute: 0 10*3/uL (ref 0.0–0.1)
Basophils Relative: 0.3 % (ref 0.0–3.0)
Eosinophils Absolute: 0.1 10*3/uL (ref 0.0–0.7)
Eosinophils Relative: 1 % (ref 0.0–5.0)
HCT: 38.5 % (ref 36.0–46.0)
Hemoglobin: 12.3 g/dL (ref 12.0–15.0)
Lymphocytes Relative: 20.5 % (ref 12.0–46.0)
Lymphs Abs: 1.8 10*3/uL (ref 0.7–4.0)
MCHC: 31.9 g/dL (ref 30.0–36.0)
MCV: 78.3 fl (ref 78.0–100.0)
Monocytes Absolute: 0.7 10*3/uL (ref 0.1–1.0)
Monocytes Relative: 8.1 % (ref 3.0–12.0)
Neutro Abs: 6.1 10*3/uL (ref 1.4–7.7)
Neutrophils Relative %: 70.1 % (ref 43.0–77.0)
Platelets: 393 10*3/uL (ref 150.0–400.0)
RBC: 4.92 Mil/uL (ref 3.87–5.11)
RDW: 18.2 % — ABNORMAL HIGH (ref 11.5–15.5)
WBC: 8.8 10*3/uL (ref 4.0–10.5)

## 2023-08-11 LAB — COMPREHENSIVE METABOLIC PANEL
ALT: 22 U/L (ref 0–35)
AST: 15 U/L (ref 0–37)
Albumin: 3.8 g/dL (ref 3.5–5.2)
Alkaline Phosphatase: 86 U/L (ref 39–117)
BUN: 9 mg/dL (ref 6–23)
CO2: 27 meq/L (ref 19–32)
Calcium: 8.7 mg/dL (ref 8.4–10.5)
Chloride: 102 meq/L (ref 96–112)
Creatinine, Ser: 0.85 mg/dL (ref 0.40–1.20)
GFR: 87.01 mL/min (ref >60.00)
Glucose, Bld: 114 mg/dL — ABNORMAL HIGH (ref 70–99)
Potassium: 3.7 meq/L (ref 3.5–5.1)
Sodium: 137 meq/L (ref 135–145)
Total Bilirubin: 0.4 mg/dL (ref 0.2–1.2)
Total Protein: 6.7 g/dL (ref 6.0–8.3)

## 2023-08-11 LAB — LIPID PANEL
Cholesterol: 172 mg/dL (ref 0–200)
HDL: 54 mg/dL (ref >39.00)
LDL Cholesterol: 84 mg/dL (ref 0–99)
NonHDL: 118.3
Total CHOL/HDL Ratio: 3
Triglycerides: 171 mg/dL — ABNORMAL HIGH (ref 0.0–149.0)
VLDL: 34.2 mg/dL (ref 0.0–40.0)

## 2023-08-11 LAB — MICROALBUMIN / CREATININE URINE RATIO
Creatinine,U: 169.7 mg/dL
Microalb Creat Ratio: 0.6 mg/g (ref 0.0–30.0)
Microalb, Ur: 1.1 mg/dL (ref 0.0–1.9)

## 2023-08-11 LAB — HEMOGLOBIN A1C: Hgb A1c MFr Bld: 5.9 % (ref 4.6–6.5)

## 2023-08-11 MED ORDER — DULOXETINE HCL 60 MG PO CPEP
60.0000 mg | ORAL_CAPSULE | Freq: Every day | ORAL | 3 refills | Status: DC
  Filled 2023-08-11 – 2023-08-22 (×2): qty 90, 90d supply, fill #0
  Filled 2023-09-29 – 2023-11-19 (×3): qty 90, 90d supply, fill #1
  Filled 2024-02-15: qty 90, 90d supply, fill #2

## 2023-08-11 NOTE — Patient Instructions (Signed)
It was great to see you!  Measure blood pressure -- goal ideally 130/80  Please go to the lab for blood work.   Our office will call you with your results unless you have chosen to receive results via MyChart.  If your blood work is normal we will follow-up each year for physicals and as scheduled for chronic medical problems.  If anything is abnormal we will treat accordingly and get you in for a follow-up.  Take care,  [name]

## 2023-08-13 ENCOUNTER — Encounter: Payer: Self-pay | Admitting: Physician Assistant

## 2023-08-21 ENCOUNTER — Other Ambulatory Visit (HOSPITAL_COMMUNITY): Payer: Self-pay

## 2023-08-22 ENCOUNTER — Other Ambulatory Visit (HOSPITAL_COMMUNITY): Payer: Self-pay

## 2023-08-22 ENCOUNTER — Other Ambulatory Visit: Payer: Self-pay | Admitting: Physician Assistant

## 2023-08-23 ENCOUNTER — Other Ambulatory Visit (HOSPITAL_COMMUNITY): Payer: Self-pay

## 2023-08-24 ENCOUNTER — Other Ambulatory Visit: Payer: Self-pay | Admitting: Physician Assistant

## 2023-08-24 ENCOUNTER — Other Ambulatory Visit: Payer: Self-pay

## 2023-08-24 ENCOUNTER — Other Ambulatory Visit (HOSPITAL_COMMUNITY): Payer: Self-pay

## 2023-08-24 MED ORDER — AMPHETAMINE-DEXTROAMPHET ER 25 MG PO CP24
25.0000 mg | ORAL_CAPSULE | ORAL | 0 refills | Status: DC
  Filled 2023-08-24 – 2023-08-31 (×2): qty 30, 30d supply, fill #0

## 2023-08-24 MED ORDER — AMPHETAMINE-DEXTROAMPHET ER 25 MG PO CP24: 25.0000 mg | ORAL_CAPSULE | ORAL | 0 refills | Status: DC

## 2023-08-24 MED ORDER — AMPHETAMINE-DEXTROAMPHET ER 25 MG PO CP24
25.0000 mg | ORAL_CAPSULE | ORAL | 0 refills | Status: DC
  Filled 2023-10-08: qty 30, 30d supply, fill #0

## 2023-08-24 NOTE — Telephone Encounter (Signed)
Pt requesting Adderall XR 25 mg. Last OV 08/11/2023

## 2023-08-25 ENCOUNTER — Other Ambulatory Visit (HOSPITAL_COMMUNITY): Payer: Self-pay

## 2023-08-25 DIAGNOSIS — Z113 Encounter for screening for infections with a predominantly sexual mode of transmission: Secondary | ICD-10-CM | POA: Diagnosis not present

## 2023-08-25 DIAGNOSIS — Z01419 Encounter for gynecological examination (general) (routine) without abnormal findings: Secondary | ICD-10-CM | POA: Diagnosis not present

## 2023-08-25 DIAGNOSIS — Z114 Encounter for screening for human immunodeficiency virus [HIV]: Secondary | ICD-10-CM | POA: Diagnosis not present

## 2023-08-25 DIAGNOSIS — N921 Excessive and frequent menstruation with irregular cycle: Secondary | ICD-10-CM | POA: Diagnosis not present

## 2023-08-25 DIAGNOSIS — Z975 Presence of (intrauterine) contraceptive device: Secondary | ICD-10-CM | POA: Diagnosis not present

## 2023-08-25 LAB — RESULTS CONSOLE HPV: CHL HPV: NEGATIVE

## 2023-08-25 LAB — HM PAP SMEAR

## 2023-08-25 MED ORDER — NORGESTIMATE-ETH ESTRADIOL 0.25-35 MG-MCG PO TABS
1.0000 | ORAL_TABLET | Freq: Every day | ORAL | 5 refills | Status: DC
  Filled 2023-08-25: qty 112, 84d supply, fill #0
  Filled 2023-10-26 – 2023-11-19 (×2): qty 112, 84d supply, fill #1
  Filled 2024-02-15: qty 112, 84d supply, fill #2

## 2023-08-25 MED ORDER — BUPROPION HCL ER (SR) 150 MG PO TB12
150.0000 mg | ORAL_TABLET | Freq: Two times a day (BID) | ORAL | 0 refills | Status: DC
  Filled 2023-08-25: qty 60, 30d supply, fill #0

## 2023-08-26 ENCOUNTER — Other Ambulatory Visit (HOSPITAL_COMMUNITY): Payer: Self-pay

## 2023-09-01 ENCOUNTER — Other Ambulatory Visit: Payer: Self-pay

## 2023-09-01 ENCOUNTER — Other Ambulatory Visit (HOSPITAL_COMMUNITY): Payer: Self-pay

## 2023-09-29 ENCOUNTER — Other Ambulatory Visit (HOSPITAL_COMMUNITY): Payer: Self-pay

## 2023-09-29 ENCOUNTER — Other Ambulatory Visit: Payer: Self-pay | Admitting: Physician Assistant

## 2023-09-29 ENCOUNTER — Other Ambulatory Visit: Payer: Self-pay

## 2023-09-29 MED ORDER — BUPROPION HCL ER (SR) 150 MG PO TB12
150.0000 mg | ORAL_TABLET | Freq: Two times a day (BID) | ORAL | 0 refills | Status: DC
  Filled 2023-09-29: qty 60, 30d supply, fill #0

## 2023-09-29 MED ORDER — OZEMPIC (2 MG/DOSE) 8 MG/3ML ~~LOC~~ SOPN
2.0000 mg | PEN_INJECTOR | SUBCUTANEOUS | 1 refills | Status: DC
  Filled 2023-09-29: qty 3, 28d supply, fill #0
  Filled 2023-10-26: qty 3, 28d supply, fill #1

## 2023-09-29 NOTE — Telephone Encounter (Signed)
Pt requesting refill for Adderall XR 25 mg. Last OV 08/11/2023.

## 2023-09-30 ENCOUNTER — Other Ambulatory Visit (HOSPITAL_COMMUNITY): Payer: Self-pay

## 2023-10-05 ENCOUNTER — Other Ambulatory Visit: Payer: Self-pay | Admitting: Physician Assistant

## 2023-10-05 NOTE — Telephone Encounter (Signed)
Rx was already sent for Adderall XR 25 mg.

## 2023-10-08 ENCOUNTER — Other Ambulatory Visit: Payer: Self-pay

## 2023-10-08 ENCOUNTER — Other Ambulatory Visit (HOSPITAL_COMMUNITY): Payer: Self-pay

## 2023-10-12 ENCOUNTER — Encounter: Payer: Self-pay | Admitting: Physician Assistant

## 2023-10-13 ENCOUNTER — Other Ambulatory Visit (HOSPITAL_COMMUNITY): Payer: Self-pay

## 2023-10-13 NOTE — Progress Notes (Shared)
Tanya Dorsey is a 37 y.o. female here for a new problem.  History of Present Illness:   No chief complaint on file.   HPI  Muscle Spasms She has been having one-sided muscle spasms on the front of her chin once a week. Also having intermittent tingling on the tip of her nose for four days. Requesting electrolyte labs.   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   Heart murmur    Hypertension    Prediabetes      Social History   Tobacco Use   Smoking status: Never    Passive exposure: Never   Smokeless tobacco: Never  Vaping Use   Vaping status: Never Used  Substance Use Topics   Alcohol use: No   Drug use: No    Past Surgical History:  Procedure Laterality Date   CESAREAN SECTION N/A 08/02/2014   Procedure: CESAREAN SECTION;  Surgeon: Esmeralda Arthur, MD;  Location: [facility];  Service: Obstetrics;  Laterality: N/A;   CHOLECYSTECTOMY     TONSILLECTOMY     WISDOM TOOTH EXTRACTION      Family History  Problem Relation Age of Onset   Diabetes Mother    Hypertension Mother    Thyroid disease Mother    Hypertension Father    Thyroid cancer Neg Hx     No Known Allergies  Current Medications:   Current Outpatient Medications:    Accu-Chek Softclix Lancets lancets, USE TO CHECK BLOOD SUGARS THREE TO FOUR TIMES A DAY, Disp: 200 each, Rfl: 3   amphetamine-dextroamphetamine (ADDERALL XR) 25 MG 24 hr capsule, Take 1 capsule by mouth every morning., Disp: 30 capsule, Rfl: 0   amphetamine-dextroamphetamine (ADDERALL XR) 25 MG 24 hr capsule, Take 1 capsule by mouth every morning., Disp: 30 capsule, Rfl: 0   [START ON 10/23/2023] amphetamine-dextroamphetamine (ADDERALL XR) 25 MG 24 hr capsule, Take 1 capsule by mouth every morning. 10/22/23, Disp: 30 capsule, Rfl:  0   Blood Glucose Calibration (ACCU-CHEK GUIDE CONTROL) LIQD, USE TO CHECK TEST STRIPS AS DIRECTED, Disp: 1 each, Rfl: 3   Blood Glucose Monitoring Suppl (ACCU-CHEK GUIDE) w/Device KIT, USE TO CHECK BLOOD SUGARS, Disp: 1 kit, Rfl: 0   buPROPion (WELLBUTRIN SR) 150 MG 12 hr tablet, Take 1 tablet (150 mg total) by mouth 2 (two) times daily., Disp: 60 tablet, Rfl: 0   cholecalciferol (VITAMIN D3) 25 MCG (1000 UNIT) tablet, Take 1,000 Units by mouth daily., Disp: , Rfl:    Continuous Glucose Sensor (FREESTYLE LIBRE 2 SENSOR) MISC, Apply to skin and change every 14 days, Disp: 2 each, Rfl: 2   DULoxetine (CYMBALTA) 60 MG capsule, Take 1 capsule (60 mg total) by mouth daily., Disp: 90 capsule, Rfl: 3   ferrous sulfate (SLOW FE) 160 (50 Fe) MG TBCR SR tablet, Take by mouth daily., Disp: , Rfl:    glucose blood (ACCU-CHEK GUIDE) test strip, USE TO CHECK BLOOD SUGARS THREE TO FOUR TIMES A DAY., Disp: 200 each, Rfl: 3   LORazepam (ATIVAN) 0.5 MG tablet, Take 0.5 mg by mouth daily as needed., Disp: , Rfl:    norgestimate-ethinyl estradiol (ORTHO-CYCLEN) 0.25-35 MG-MCG tablet, Take 1 tablet by mouth daily skip reminder pills, Disp: 84 tablet, Rfl:  5   PARAGARD INTRAUTERINE COPPER IU, by Intrauterine route. Inserted 2 yrs ago at [facility]. (2022), Disp: , Rfl:    Semaglutide, 2 MG/DOSE, (OZEMPIC, 2 MG/DOSE,) 8 MG/3ML SOPN, Inject 2 mg as directed once a week., Disp: 3 mL, Rfl: 1   vitamin B-12 (CYANOCOBALAMIN) 100 MCG tablet, Take 1 tablet (100 mcg total) by mouth daily., Disp: 90 tablet, Rfl: 3   Review of Systems:   ROS  Vitals:   There were no vitals filed for this visit.   There is no height or weight on file to calculate BMI.  Physical Exam:   Physical Exam  Assessment and Plan:   ***   I,Alexander Ruley,acting as a scribe for Jarold Motto, PA.,have documented all relevant documentation on the behalf of Jarold Motto, PA,as directed by  Jarold Motto, PA while in the presence  of Jarold Motto, [place].   ***   Jarold Motto, PA-C

## 2023-10-14 ENCOUNTER — Other Ambulatory Visit (HOSPITAL_COMMUNITY): Payer: Self-pay

## 2023-10-14 ENCOUNTER — Ambulatory Visit: Payer: Medicaid Other | Admitting: Physician Assistant

## 2023-10-15 ENCOUNTER — Encounter: Payer: Self-pay | Admitting: Physician Assistant

## 2023-10-15 ENCOUNTER — Ambulatory Visit: Payer: Medicaid Other | Admitting: Physician Assistant

## 2023-10-15 VITALS — BP 130/86 | HR 104 | Temp 97.7°F | Ht 64.5 in | Wt 270.0 lb

## 2023-10-15 DIAGNOSIS — H9193 Unspecified hearing loss, bilateral: Secondary | ICD-10-CM

## 2023-10-15 DIAGNOSIS — R202 Paresthesia of skin: Secondary | ICD-10-CM

## 2023-10-15 LAB — CBC WITH DIFFERENTIAL/PLATELET
Basophils Absolute: 0 10*3/uL (ref 0.0–0.1)
Basophils Relative: 0.5 % (ref 0.0–3.0)
Eosinophils Absolute: 0.1 10*3/uL (ref 0.0–0.7)
Eosinophils Relative: 1.1 % (ref 0.0–5.0)
HCT: 38.3 % (ref 36.0–46.0)
Hemoglobin: 12.3 g/dL (ref 12.0–15.0)
Lymphocytes Relative: 21 % (ref 12.0–46.0)
Lymphs Abs: 1.8 10*3/uL (ref 0.7–4.0)
MCHC: 32.1 g/dL (ref 30.0–36.0)
MCV: 80.9 fL (ref 78.0–100.0)
Monocytes Absolute: 0.7 10*3/uL (ref 0.1–1.0)
Monocytes Relative: 8.3 % (ref 3.0–12.0)
Neutro Abs: 5.8 10*3/uL (ref 1.4–7.7)
Neutrophils Relative %: 69.1 % (ref 43.0–77.0)
Platelets: 420 10*3/uL — ABNORMAL HIGH (ref 150.0–400.0)
RBC: 4.73 Mil/uL (ref 3.87–5.11)
RDW: 15.5 % (ref 11.5–15.5)
WBC: 8.4 10*3/uL (ref 4.0–10.5)

## 2023-10-15 LAB — TSH: TSH: 0.95 u[IU]/mL (ref 0.35–5.50)

## 2023-10-15 LAB — COMPREHENSIVE METABOLIC PANEL
ALT: 20 U/L (ref 0–35)
AST: 16 U/L (ref 0–37)
Albumin: 3.7 g/dL (ref 3.5–5.2)
Alkaline Phosphatase: 73 U/L (ref 39–117)
BUN: 11 mg/dL (ref 6–23)
CO2: 23 meq/L (ref 19–32)
Calcium: 8.8 mg/dL (ref 8.4–10.5)
Chloride: 102 meq/L (ref 96–112)
Creatinine, Ser: 0.86 mg/dL (ref 0.40–1.20)
GFR: 85.69 mL/min (ref >60.00)
Glucose, Bld: 120 mg/dL — ABNORMAL HIGH (ref 70–99)
Potassium: 4 meq/L (ref 3.5–5.1)
Sodium: 136 meq/L (ref 135–145)
Total Bilirubin: 0.2 mg/dL (ref 0.2–1.2)
Total Protein: 6.9 g/dL (ref 6.0–8.3)

## 2023-10-15 LAB — VITAMIN B12: Vitamin B-12: 270 pg/mL (ref 211–911)

## 2023-10-15 NOTE — Progress Notes (Signed)
Tanya Dorsey is a 37 y.o. female here for a new problem.  History of Present Illness:   Chief Complaint  Patient presents with   Spasms    Pt c/o having muscle spasms of her chin,x 1 months, tip of nose has tingling feeling started on Saturday.   HPI  Muscle Spasms (Face): 11/18 reported via MyChart: "I've been having muscle spasms on the front of my chin (only on one side). I just chalked it up to muscle tension, but now the tip of my nose is tingly/feels fluttery. It's felt like that for about 2 days pretty consistently. The chin spasm happens maybe once a week. 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 There's a lot of noise and twitching happening on my face."  Requested electrolyte labs.  Mentioned she hadn't yet made an appointment with neurology and would wait until being seen to decide if one was needed.  Today she reports that her chin muscle spasm for at least a month and the tip of her nose has been tingling since Saturday. Also has been having an ear flutter for years that is reproducible with palpation but does also occur with loud sounds.  States that the chin spasm occurs for longer than 10 seconds and does not occur simultaneously with nose tingling or ear flutter.   Notes her stress level has been heightened recently, though she hasn't used her Ativan. Mentions she hasn't been sleeping well for a while - describes that she goes to bed at 10 PM, waking up around 3-3:30 AM, or wakes up drenched in sweat. Has checked her blood sugars then, and they are sometimes low, but even when she eats a snack she will wake up around 3. This has been occurring before she started oral contraceptive pill(s) and ssri/snri's.  She states she is UTD on her eye exam. Denies headaches/ recent migraines, worsening hearing loss, changes in taste/smell/vision, weakness/tingling/tremors in hands or feet, chest pain or SOB, or pain when chin spasms occur.   She has had to see  audiology and ENT in the past for b/l hearing loss, vestibular migraine, eustachian tube dysfunction. Has never had imaging. Was told she likely has meniere's or symptom(s) from vestibular migraine.   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   Heart murmur    Hypertension    Prediabetes     Social History   Tobacco Use   Smoking status: Never    Passive exposure: Never   Smokeless tobacco: Never  Vaping Use   Vaping status: Never Used  Substance Use Topics   Alcohol use: No   Drug use: No   Past Surgical History:  Procedure Laterality Date   CESAREAN SECTION N/A 08/02/2014   Procedure: CESAREAN SECTION;  Surgeon: Esmeralda Arthur, MD;  Location: [facility];  Service: Obstetrics;  Laterality: N/A;   CHOLECYSTECTOMY     TONSILLECTOMY     WISDOM TOOTH EXTRACTION     Family History  Problem Relation Age of Onset   Diabetes Mother    Hypertension Mother    Thyroid disease Mother    Hypertension Father    Thyroid cancer Neg Hx    No Known Allergies Current Medications:  Current Outpatient Medications:    Accu-Chek Softclix Lancets lancets, USE TO CHECK BLOOD SUGARS THREE TO FOUR TIMES A DAY, Disp: 200 each, Rfl: 3   amphetamine-dextroamphetamine (ADDERALL XR) 25 MG 24 hr capsule, Take 1 capsule by mouth every morning., Disp: 30 capsule, Rfl: 0   [START ON 10/23/2023] amphetamine-dextroamphetamine (ADDERALL XR) 25 MG 24 hr capsule, Take 1 capsule by mouth every morning. 10/22/23, Disp: 30 capsule, Rfl: 0   Blood Glucose Calibration (ACCU-CHEK GUIDE CONTROL) LIQD, USE TO CHECK TEST STRIPS AS DIRECTED, Disp: 1 each, Rfl: 3   Blood Glucose Monitoring Suppl (ACCU-CHEK GUIDE) w/Device KIT, USE TO CHECK BLOOD SUGARS, Disp: 1 kit, Rfl: 0   buPROPion (WELLBUTRIN SR) 150 MG 12 hr  tablet, Take 1 tablet (150 mg total) by mouth 2 (two) times daily., Disp: 60 tablet, Rfl: 0   cholecalciferol (VITAMIN D3) 25 MCG (1000 UNIT) tablet, Take 1,000 Units by mouth daily., Disp: , Rfl:    Continuous Glucose Sensor (FREESTYLE LIBRE 2 SENSOR) MISC, Apply to skin and change every 14 days, Disp: 2 each, Rfl: 2   DULoxetine (CYMBALTA) 60 MG capsule, Take 1 capsule (60 mg total) by mouth daily., Disp: 90 capsule, Rfl: 3   glucose blood (ACCU-CHEK GUIDE) test strip, USE TO CHECK BLOOD SUGARS THREE TO FOUR TIMES A DAY., Disp: 200 each, Rfl: 3   LORazepam (ATIVAN) 0.5 MG tablet, Take 0.5 mg by mouth daily as needed., Disp: , Rfl:    norgestimate-ethinyl estradiol (ORTHO-CYCLEN) 0.25-35 MG-MCG tablet, Take 1 tablet by mouth daily skip reminder pills, Disp: 84 tablet, Rfl: 5   PARAGARD INTRAUTERINE COPPER IU, by Intrauterine route. Inserted 2 yrs ago at [facility]. (2022), Disp: , Rfl:    Semaglutide, 2 MG/DOSE, (OZEMPIC, 2 MG/DOSE,) 8 MG/3ML SOPN, Inject 2 mg as directed once a week., Disp: 3 mL, Rfl: 1   vitamin B-12 (CYANOCOBALAMIN) 100 MCG tablet, Take 1 tablet (100 mcg total) by mouth daily., Disp: 90 tablet, Rfl: 3   amphetamine-dextroamphetamine (ADDERALL XR) 25 MG 24 hr capsule, Take 1 capsule by mouth every morning., Disp: 30 capsule, Rfl: 0  Review of Systems:   ROS See pertinent positives and negatives as per the HPI.  Vitals:   Vitals:   10/15/23 0948  BP: 130/86  Pulse: (!) 104  Temp: 97.7 F (36.5 C)  TempSrc: Temporal  SpO2: 98%  Weight: 270 lb (122.5 kg)  Height: 5' 4.5" (1.638 m)     Body mass index is 45.63 kg/m.  Physical Exam:   Physical Exam Vitals and nursing note reviewed.  Constitutional:      General: She is not in acute distress.    Appearance: She is well-developed. She is not ill-appearing or toxic-appearing.  HENT:     Head: Normocephalic and atraumatic.     Right Ear: Tympanic membrane, ear canal and external ear normal. Tympanic membrane  is not erythematous, retracted or bulging.     Left Ear: Tympanic membrane, ear canal and external ear normal. Tympanic membrane is not erythematous, retracted or bulging.     Nose: Nose normal.     Right Sinus: No maxillary sinus tenderness or frontal sinus tenderness.     Left Sinus: No maxillary sinus tenderness or frontal sinus tenderness.     Mouth/Throat:     Pharynx: Uvula midline. No posterior oropharyngeal erythema.  Eyes:     General: Lids are normal.     Conjunctiva/sclera: Conjunctivae normal.  Neck:     Trachea: Trachea normal.  Cardiovascular:  Rate and Rhythm: Normal rate and regular rhythm.     Pulses: Normal pulses.     Heart sounds: Normal heart sounds, S1 normal and S2 normal.  Pulmonary:     Effort: Pulmonary effort is normal.     Breath sounds: Normal breath sounds. No decreased breath sounds, wheezing, rhonchi or rales.  Lymphadenopathy:     Cervical: No cervical adenopathy.  Skin:    General: Skin is warm and dry.  Neurological:     Mental Status: She is alert.     GCS: GCS eye subscore is 4. GCS verbal subscore is 5. GCS motor subscore is 6.  Psychiatric:        Speech: Speech normal.        Behavior: Behavior normal. Behavior is cooperative.     Assessment and Plan:   Paresthesias; Bilateral hearing loss, unspecified hearing loss type Unclear etiology Will order blood work to rule out organic cause of symptom(s) Refer to neurology Due to history of premature hearing loss, will also go ahead and order MRI for further evaluation Reach out if new/worsening symptom(s)    I,Emily Lagle,acting as a scribe for [employer], PA.,have documented all relevant documentation on the behalf of Jarold Motto, PA,as directed by  Jarold Motto, PA while in the presence of Jarold Motto, [place].  I, Jarold Motto, [place], have reviewed all documentation for this visit. The documentation on 10/15/23 for the exam, diagnosis, procedures, and orders are all  accurate and complete.  Jarold Motto, PA-C

## 2023-10-26 ENCOUNTER — Other Ambulatory Visit (HOSPITAL_COMMUNITY): Payer: Self-pay

## 2023-10-26 ENCOUNTER — Other Ambulatory Visit: Payer: Self-pay | Admitting: Physician Assistant

## 2023-10-26 ENCOUNTER — Other Ambulatory Visit: Payer: Self-pay

## 2023-10-26 MED ORDER — AMPHETAMINE-DEXTROAMPHET ER 25 MG PO CP24
25.0000 mg | ORAL_CAPSULE | ORAL | 0 refills | Status: DC
  Filled 2024-01-13 – 2024-01-14 (×2): qty 30, 30d supply, fill #0

## 2023-10-26 MED ORDER — AMPHETAMINE-DEXTROAMPHET ER 25 MG PO CP24
25.0000 mg | ORAL_CAPSULE | ORAL | 0 refills | Status: DC
  Filled 2023-12-17: qty 30, 30d supply, fill #0

## 2023-10-26 MED ORDER — AMPHETAMINE-DEXTROAMPHET ER 25 MG PO CP24
25.0000 mg | ORAL_CAPSULE | ORAL | 0 refills | Status: DC
  Filled 2023-10-26 – 2023-11-14 (×3): qty 30, 30d supply, fill #0

## 2023-10-26 NOTE — Telephone Encounter (Signed)
 Pt requesting refill for Adderall XR 25 mg. Last OV 08/11/2023.

## 2023-10-27 ENCOUNTER — Other Ambulatory Visit: Payer: Self-pay

## 2023-10-27 ENCOUNTER — Other Ambulatory Visit (HOSPITAL_COMMUNITY): Payer: Self-pay

## 2023-10-27 MED ORDER — BUPROPION HCL ER (SR) 150 MG PO TB12
150.0000 mg | ORAL_TABLET | Freq: Two times a day (BID) | ORAL | 0 refills | Status: DC
  Filled 2023-10-27: qty 60, 30d supply, fill #0

## 2023-10-27 NOTE — Progress Notes (Signed)
Tanya Dorsey is a 37 y.o. female here for a new problem.  History of Present Illness:   No chief complaint on file.   HPI  Pharyngitis Has been experiencing sore throat since  Notes white patches on tonsils.    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   Heart murmur    Hypertension    Prediabetes      Social History   Tobacco Use   Smoking status: Never    Passive exposure: Never   Smokeless tobacco: Never  Vaping Use   Vaping status: Never Used  Substance Use Topics   Alcohol use: No   Drug use: No    Past Surgical History:  Procedure Laterality Date   CESAREAN SECTION N/A 08/02/2014   Procedure: CESAREAN SECTION;  Surgeon: Esmeralda Arthur, MD;  Location: [facility];  Service: Obstetrics;  Laterality: N/A;   CHOLECYSTECTOMY     TONSILLECTOMY     WISDOM TOOTH EXTRACTION      Family History  Problem Relation Age of Onset   Diabetes Mother    Hypertension Mother    Thyroid disease Mother    Hypertension Father    Thyroid cancer Neg Hx     No Known Allergies  Current Medications:   Current Outpatient Medications:    Accu-Chek Softclix Lancets lancets, USE TO CHECK BLOOD SUGARS THREE TO FOUR TIMES A DAY, Disp: 200 each, Rfl: 3   amphetamine-dextroamphetamine (ADDERALL XR) 25 MG 24 hr capsule, Take 1 capsule by mouth every morning., Disp: 30 capsule, Rfl: 0   [START ON 11/25/2023] amphetamine-dextroamphetamine (ADDERALL XR) 25 MG 24 hr capsule, Take 1 capsule by mouth every morning., Disp: 30 capsule, Rfl: 0   [START ON 12/25/2023] amphetamine-dextroamphetamine (ADDERALL XR) 25 MG 24 hr capsule, Take 1 capsule by mouth every morning., Disp: 30 capsule, Rfl: 0   Blood Glucose Calibration (ACCU-CHEK GUIDE CONTROL) LIQD, USE TO CHECK TEST STRIPS AS DIRECTED,  Disp: 1 each, Rfl: 3   Blood Glucose Monitoring Suppl (ACCU-CHEK GUIDE) w/Device KIT, USE TO CHECK BLOOD SUGARS, Disp: 1 kit, Rfl: 0   buPROPion (WELLBUTRIN SR) 150 MG 12 hr tablet, Take 1 tablet (150 mg total) by mouth 2 (two) times daily., Disp: 60 tablet, Rfl: 0   cholecalciferol (VITAMIN D3) 25 MCG (1000 UNIT) tablet, Take 1,000 Units by mouth daily., Disp: , Rfl:    Continuous Glucose Sensor (FREESTYLE LIBRE 2 SENSOR) MISC, Apply to skin and change every 14 days, Disp: 2 each, Rfl: 2   DULoxetine (CYMBALTA) 60 MG capsule, Take 1 capsule (60 mg total) by mouth daily., Disp: 90 capsule, Rfl: 3   glucose blood (ACCU-CHEK GUIDE) test strip, USE TO CHECK BLOOD SUGARS THREE TO FOUR TIMES A DAY., Disp: 200 each, Rfl: 3   LORazepam (ATIVAN) 0.5 MG tablet, Take 0.5 mg by mouth daily as needed., Disp: , Rfl:    norgestimate-ethinyl estradiol (ORTHO-CYCLEN) 0.25-35 MG-MCG tablet, Take 1 tablet by mouth daily skip reminder pills, Disp: 84 tablet, Rfl: 5   PARAGARD INTRAUTERINE COPPER IU, by Intrauterine route. Inserted 2 yrs ago at [facility]. (2022), Disp: , Rfl:    Semaglutide, 2 MG/DOSE, (OZEMPIC, 2 MG/DOSE,) 8 MG/3ML SOPN, Inject 2 mg as directed once  a week., Disp: 3 mL, Rfl: 1   vitamin B-12 (CYANOCOBALAMIN) 100 MCG tablet, Take 1 tablet (100 mcg total) by mouth daily., Disp: 90 tablet, Rfl: 3   Review of Systems:   ROS  Vitals:   There were no vitals filed for this visit.   There is no height or weight on file to calculate BMI.  Physical Exam:   Physical Exam  Assessment and Plan:   ***   I,Alexander Ruley,acting as a scribe for Jarold Motto, PA.,have documented all relevant documentation on the behalf of Jarold Motto, PA,as directed by  Jarold Motto, PA while in the presence of Jarold Motto, [place].   ***  Jarold Motto, PA-C

## 2023-10-28 ENCOUNTER — Ambulatory Visit: Payer: Medicaid Other | Admitting: Physician Assistant

## 2023-10-28 ENCOUNTER — Encounter: Payer: Self-pay | Admitting: Physician Assistant

## 2023-10-28 ENCOUNTER — Other Ambulatory Visit (HOSPITAL_COMMUNITY): Payer: Self-pay

## 2023-10-28 VITALS — BP 126/82 | HR 97 | Temp 97.5°F | Wt 272.6 lb

## 2023-10-28 DIAGNOSIS — H6502 Acute serous otitis media, left ear: Secondary | ICD-10-CM

## 2023-10-28 DIAGNOSIS — E538 Deficiency of other specified B group vitamins: Secondary | ICD-10-CM

## 2023-10-28 DIAGNOSIS — J029 Acute pharyngitis, unspecified: Secondary | ICD-10-CM

## 2023-10-28 LAB — POCT RAPID STREP A (OFFICE): Rapid Strep A Screen: POSITIVE — AB

## 2023-10-28 MED ORDER — AMOXICILLIN 875 MG PO TABS
875.0000 mg | ORAL_TABLET | Freq: Two times a day (BID) | ORAL | 0 refills | Status: AC
  Filled 2023-10-28: qty 20, 10d supply, fill #0

## 2023-10-28 MED ORDER — FLUCONAZOLE 150 MG PO TABS
150.0000 mg | ORAL_TABLET | Freq: Once | ORAL | 0 refills | Status: AC
  Filled 2023-10-28: qty 1, 1d supply, fill #0

## 2023-10-28 MED ORDER — CYANOCOBALAMIN 1000 MCG/ML IJ SOLN
INTRAMUSCULAR | 0 refills | Status: DC
  Filled 2023-10-28: qty 3, 21d supply, fill #0
  Filled 2023-11-30: qty 3, 21d supply, fill #1
  Filled 2024-01-04: qty 3, 21d supply, fill #2
  Filled 2024-02-15: qty 3, 21d supply, fill #3

## 2023-10-28 MED ORDER — CYANOCOBALAMIN 1000 MCG/ML IJ SOLN
1000.0000 ug | Freq: Once | INTRAMUSCULAR | Status: AC
  Administered 2023-10-28: 1000 ug via INTRAMUSCULAR

## 2023-10-28 NOTE — Patient Instructions (Signed)
It was great to see you!  You have a left ear infection and maybe strep? Start amox and diflucan has been sent if you need it  Start B-12 injections First one today, do the rest at home -- weekly x 3 weeks, then monthly x 3 months  Take care,  Jarold Motto PA-C

## 2023-11-02 ENCOUNTER — Encounter: Payer: Self-pay | Admitting: Physician Assistant

## 2023-11-09 ENCOUNTER — Other Ambulatory Visit: Payer: Self-pay

## 2023-11-09 ENCOUNTER — Encounter: Payer: Self-pay | Admitting: Physician Assistant

## 2023-11-10 ENCOUNTER — Other Ambulatory Visit (HOSPITAL_COMMUNITY): Payer: Self-pay

## 2023-11-10 ENCOUNTER — Other Ambulatory Visit: Payer: Self-pay | Admitting: Physician Assistant

## 2023-11-10 MED ORDER — LORAZEPAM 0.5 MG PO TABS
0.5000 mg | ORAL_TABLET | Freq: Every day | ORAL | 0 refills | Status: AC | PRN
  Filled 2023-11-10: qty 20, 20d supply, fill #0

## 2023-11-11 ENCOUNTER — Inpatient Hospital Stay
Admission: RE | Admit: 2023-11-11 | Discharge: 2023-11-11 | Disposition: A | Discharge status: Home / Self Care | Payer: Medicaid Other | Source: Ambulatory Visit | Attending: Physician Assistant

## 2023-11-11 DIAGNOSIS — H9193 Unspecified hearing loss, bilateral: Secondary | ICD-10-CM

## 2023-11-11 DIAGNOSIS — R202 Paresthesia of skin: Secondary | ICD-10-CM | POA: Diagnosis not present

## 2023-11-11 MED ORDER — GADOPICLENOL 0.5 MMOL/ML IV SOLN
10.0000 mL | Freq: Once | INTRAVENOUS | Status: AC | PRN
  Administered 2023-11-11: 10 mL via INTRAVENOUS

## 2023-11-12 ENCOUNTER — Other Ambulatory Visit: Payer: Self-pay

## 2023-11-14 ENCOUNTER — Other Ambulatory Visit (HOSPITAL_COMMUNITY): Payer: Self-pay

## 2023-11-16 ENCOUNTER — Other Ambulatory Visit (HOSPITAL_COMMUNITY): Payer: Self-pay

## 2023-11-16 ENCOUNTER — Other Ambulatory Visit: Payer: Self-pay

## 2023-11-19 ENCOUNTER — Other Ambulatory Visit: Payer: Self-pay | Admitting: Physician Assistant

## 2023-11-19 ENCOUNTER — Other Ambulatory Visit (HOSPITAL_COMMUNITY): Payer: Self-pay

## 2023-11-19 ENCOUNTER — Other Ambulatory Visit: Payer: Self-pay

## 2023-11-19 MED ORDER — OZEMPIC (2 MG/DOSE) 8 MG/3ML ~~LOC~~ SOPN
2.0000 mg | PEN_INJECTOR | SUBCUTANEOUS | 1 refills | Status: DC
  Filled 2023-11-19: qty 3, 28d supply, fill #0
  Filled 2023-11-30 – 2024-01-04 (×2): qty 3, 28d supply, fill #1

## 2023-11-20 ENCOUNTER — Other Ambulatory Visit (HOSPITAL_COMMUNITY): Payer: Self-pay

## 2023-11-24 ENCOUNTER — Encounter: Payer: Self-pay | Admitting: Physician Assistant

## 2023-11-24 NOTE — Telephone Encounter (Signed)
 Please see message about spasms. I already sent message to her about B12 injections.

## 2023-11-26 NOTE — Telephone Encounter (Signed)
 [facility] and spoke to Kaufman asked her to have MRI of the Brain read done on 12/18. Tanya Dorsey said she will elevate the message to Radiologist to have read.

## 2023-11-27 NOTE — Telephone Encounter (Signed)
MRI results are back.

## 2023-11-30 ENCOUNTER — Other Ambulatory Visit (HOSPITAL_COMMUNITY): Payer: Self-pay

## 2023-11-30 ENCOUNTER — Other Ambulatory Visit: Payer: Self-pay

## 2023-12-03 ENCOUNTER — Encounter: Payer: Self-pay | Admitting: Physician Assistant

## 2023-12-03 ENCOUNTER — Other Ambulatory Visit (HOSPITAL_COMMUNITY): Payer: Self-pay

## 2023-12-05 ENCOUNTER — Other Ambulatory Visit (HOSPITAL_COMMUNITY): Payer: Self-pay

## 2023-12-05 ENCOUNTER — Other Ambulatory Visit: Payer: Self-pay | Admitting: Physician Assistant

## 2023-12-10 ENCOUNTER — Other Ambulatory Visit (HOSPITAL_COMMUNITY): Payer: Self-pay

## 2023-12-10 ENCOUNTER — Encounter: Payer: Self-pay | Admitting: Physician Assistant

## 2023-12-10 ENCOUNTER — Other Ambulatory Visit: Payer: Self-pay | Admitting: Physician Assistant

## 2023-12-10 MED ORDER — BUPROPION HCL ER (SR) 150 MG PO TB12
150.0000 mg | ORAL_TABLET | Freq: Two times a day (BID) | ORAL | 1 refills | Status: DC
  Filled 2023-12-10: qty 180, 90d supply, fill #0
  Filled 2024-01-04 – 2024-03-10 (×3): qty 180, 90d supply, fill #1

## 2023-12-11 ENCOUNTER — Other Ambulatory Visit (HOSPITAL_COMMUNITY): Payer: Self-pay

## 2023-12-16 ENCOUNTER — Other Ambulatory Visit (HOSPITAL_COMMUNITY): Payer: Self-pay

## 2023-12-16 ENCOUNTER — Telehealth: Payer: Self-pay | Admitting: Pharmacy Technician

## 2023-12-16 NOTE — Telephone Encounter (Signed)
Pharmacy Patient Advocate Encounter  Received notification from [facility] that Prior Authorization for FreeStyle Libre 2 Sensor has been APPROVED from 12/16/2023 to 06/12/2024   PA #/Case ID/Reference #: [id number] Key: [id number]

## 2023-12-17 ENCOUNTER — Other Ambulatory Visit (HOSPITAL_COMMUNITY): Payer: Self-pay

## 2023-12-18 ENCOUNTER — Other Ambulatory Visit: Payer: Self-pay

## 2023-12-21 ENCOUNTER — Other Ambulatory Visit (HOSPITAL_COMMUNITY): Payer: Self-pay

## 2024-01-04 ENCOUNTER — Other Ambulatory Visit: Payer: Self-pay | Admitting: Physician Assistant

## 2024-01-05 ENCOUNTER — Other Ambulatory Visit (HOSPITAL_COMMUNITY): Payer: Self-pay

## 2024-01-05 ENCOUNTER — Other Ambulatory Visit: Payer: Self-pay

## 2024-01-05 MED ORDER — FREESTYLE LIBRE 2 SENSOR MISC
2 refills | Status: DC
  Filled 2024-01-05: qty 2, fill #0
  Filled 2024-01-13: qty 2, 28d supply, fill #0
  Filled 2024-01-29 – 2024-02-03 (×3): qty 2, 28d supply, fill #1

## 2024-01-08 ENCOUNTER — Ambulatory Visit: Payer: Medicaid Other | Admitting: Physician Assistant

## 2024-01-09 ENCOUNTER — Other Ambulatory Visit (HOSPITAL_COMMUNITY): Payer: Self-pay

## 2024-01-13 ENCOUNTER — Other Ambulatory Visit (HOSPITAL_COMMUNITY): Payer: Self-pay

## 2024-01-14 ENCOUNTER — Other Ambulatory Visit (HOSPITAL_COMMUNITY): Payer: Self-pay

## 2024-01-20 ENCOUNTER — Other Ambulatory Visit (HOSPITAL_COMMUNITY): Payer: Self-pay

## 2024-01-29 ENCOUNTER — Other Ambulatory Visit (HOSPITAL_COMMUNITY): Payer: Self-pay

## 2024-02-02 ENCOUNTER — Other Ambulatory Visit (HOSPITAL_COMMUNITY): Payer: Self-pay

## 2024-02-03 ENCOUNTER — Other Ambulatory Visit: Payer: Self-pay | Admitting: Physician Assistant

## 2024-02-04 ENCOUNTER — Other Ambulatory Visit (HOSPITAL_COMMUNITY): Payer: Self-pay

## 2024-02-04 ENCOUNTER — Other Ambulatory Visit: Payer: Self-pay

## 2024-02-04 MED ORDER — OZEMPIC (2 MG/DOSE) 8 MG/3ML ~~LOC~~ SOPN
2.0000 mg | PEN_INJECTOR | SUBCUTANEOUS | 1 refills | Status: DC
  Filled 2024-02-04: qty 3, 28d supply, fill #0
  Filled 2024-03-10: qty 3, 28d supply, fill #1

## 2024-02-05 ENCOUNTER — Other Ambulatory Visit (HOSPITAL_COMMUNITY): Payer: Self-pay

## 2024-02-09 ENCOUNTER — Encounter: Payer: Self-pay | Admitting: Physician Assistant

## 2024-02-09 ENCOUNTER — Other Ambulatory Visit (HOSPITAL_COMMUNITY): Payer: Self-pay

## 2024-02-09 ENCOUNTER — Ambulatory Visit: Admitting: Physician Assistant

## 2024-02-09 VITALS — BP 142/90 | HR 89 | Temp 97.7°F | Ht 64.5 in | Wt 275.0 lb

## 2024-02-09 DIAGNOSIS — R030 Elevated blood-pressure reading, without diagnosis of hypertension: Secondary | ICD-10-CM

## 2024-02-09 DIAGNOSIS — Z7985 Long-term (current) use of injectable non-insulin antidiabetic drugs: Secondary | ICD-10-CM

## 2024-02-09 DIAGNOSIS — F988 Other specified behavioral and emotional disorders with onset usually occurring in childhood and adolescence: Secondary | ICD-10-CM | POA: Diagnosis not present

## 2024-02-09 DIAGNOSIS — E538 Deficiency of other specified B group vitamins: Secondary | ICD-10-CM

## 2024-02-09 DIAGNOSIS — E119 Type 2 diabetes mellitus without complications: Secondary | ICD-10-CM

## 2024-02-09 LAB — POCT GLYCOSYLATED HEMOGLOBIN (HGB A1C): Hemoglobin A1C: 5.6 % (ref 4.0–5.6)

## 2024-02-09 MED ORDER — AMPHETAMINE-DEXTROAMPHET ER 25 MG PO CP24
25.0000 mg | ORAL_CAPSULE | ORAL | 0 refills | Status: DC
  Filled 2024-03-10 – 2024-03-23 (×3): qty 30, 30d supply, fill #0

## 2024-02-09 MED ORDER — AMPHETAMINE-DEXTROAMPHET ER 25 MG PO CP24
25.0000 mg | ORAL_CAPSULE | ORAL | 0 refills | Status: DC
  Filled 2024-02-09 – 2024-02-15 (×2): qty 30, 30d supply, fill #0

## 2024-02-09 MED ORDER — AMPHETAMINE-DEXTROAMPHET ER 25 MG PO CP24
25.0000 mg | ORAL_CAPSULE | ORAL | 0 refills | Status: DC
  Filled 2024-04-22: qty 30, 30d supply, fill #0

## 2024-02-09 MED ORDER — FREESTYLE LIBRE 3 SENSOR MISC
2 refills | Status: DC
  Filled 2024-02-09 – 2024-02-29 (×4): qty 2, 28d supply, fill #0
  Filled 2024-03-23: qty 2, 28d supply, fill #1
  Filled 2024-04-13: qty 2, 28d supply, fill #2

## 2024-02-09 NOTE — Progress Notes (Signed)
 Tanya Dorsey is a 38 y.o. female here for a follow up of a pre-existing problem.  History of Present Illness:   Chief Complaint  Patient presents with   Diabetes    HPI  Diabetes: Pt is on Ozempic 2 mg once weekly. Good compliance and tolerance.  Has sugar cravings after eating lunch/dinner. Wakes up every night around 3-4 AM with hunger and cravings.  Has been monitoring her blood glucose with Free Style 2, reports readings in the 90s. Tends to eat dinner around 6:30 PM and has a snack at 9 PM.  Snacks on pretzels or other carbs snacks.  Tries to prioritize her protein intake.  Was previously taking testosterone, doesn't believe these symptoms are due to stopping testosterone.  Plans to start working with a trainer to start weight lifting soon.   B12 deficiency: Pt is on B12 supplementation 100 mcg once daily. Also has a B12 injection 1 ml once monthly.  No acute concerns.   Elevated blood pressures: Is not monitoring her blood pressure regularly. Last checked it when she had a severe migraine, reports 130/90 at the time.  Pt currently has a copper IUD.  Has had hormonal IUDs in the past, did not tolerate well (moods).   ADD: Pt is on Adderall XR 25 mg once daily.  Good compliance and tolerance.  Pt states her moods are stable.  No acute concerns.    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   Heart murmur    Hypertension    Prediabetes      Social History   Tobacco Use   Smoking status: Never    Passive exposure: Never   Smokeless tobacco: Never  Vaping Use   Vaping status: Never Used  Substance Use Topics   Alcohol use: No   Drug use: No    Past Surgical History:  Procedure Laterality Date   CESAREAN SECTION N/A 08/02/2014   Procedure:  CESAREAN SECTION;  Surgeon: Esmeralda Arthur, MD;  Location: [facility];  Service: Obstetrics;  Laterality: N/A;   CHOLECYSTECTOMY     TONSILLECTOMY     WISDOM TOOTH EXTRACTION      Family History  Problem Relation Age of Onset   Diabetes Mother    Hypertension Mother    Thyroid disease Mother    Hypertension Father    Thyroid cancer Neg Hx     No Known Allergies  Current Medications:   Current Outpatient Medications:    Accu-Chek Softclix Lancets lancets, USE TO CHECK BLOOD SUGARS THREE TO FOUR TIMES A DAY, Disp: 200 each, Rfl: 3   amphetamine-dextroamphetamine (ADDERALL XR) 25 MG 24 hr capsule, Take 1 capsule by mouth every morning., Disp: 30 capsule, Rfl: 0   [START ON 03/10/2024] amphetamine-dextroamphetamine (ADDERALL XR) 25 MG 24 hr capsule, Take 1 capsule by mouth every morning., Disp: 30 capsule, Rfl: 0   [START ON 04/09/2024] amphetamine-dextroamphetamine (ADDERALL XR) 25 MG 24 hr capsule, Take 1 capsule by mouth every morning., Disp: 30 capsule, Rfl: 0   Blood Glucose Calibration (ACCU-CHEK GUIDE CONTROL) LIQD, USE TO CHECK TEST STRIPS AS DIRECTED, Disp: 1 each, Rfl: 3   Blood Glucose Monitoring Suppl (ACCU-CHEK GUIDE) w/Device KIT, USE TO CHECK BLOOD SUGARS,  Disp: 1 kit, Rfl: 0   buPROPion (WELLBUTRIN SR) 150 MG 12 hr tablet, Take 1 tablet (150 mg total) by mouth 2 (two) times daily., Disp: 180 tablet, Rfl: 1   cholecalciferol (VITAMIN D3) 25 MCG (1000 UNIT) tablet, Take 1,000 Units by mouth daily., Disp: , Rfl:    Continuous Glucose Sensor (FREESTYLE LIBRE 3 SENSOR) MISC, APPLY TO SKIN AND CHANGE EVERY 14 DAYS, Disp: 2 each, Rfl: 2   cyanocobalamin (VITAMIN B12) 1000 MCG/ML injection, Inject 1 ml into the muscle for weekly for 3 weeks then monthly, Disp: 10 mL, Rfl: 0   DULoxetine (CYMBALTA) 60 MG capsule, Take 1 capsule (60 mg total) by mouth daily., Disp: 90 capsule, Rfl: 3   glucose blood (ACCU-CHEK GUIDE) test strip, USE TO CHECK BLOOD SUGARS THREE TO FOUR TIMES A DAY., Disp:  200 each, Rfl: 3   LORazepam (ATIVAN) 0.5 MG tablet, Take 1 tablet (0.5 mg total) by mouth daily as needed., Disp: 20 tablet, Rfl: 0   norgestimate-ethinyl estradiol (ORTHO-CYCLEN) 0.25-35 MG-MCG tablet, Take 1 tablet by mouth daily skip reminder pills, Disp: 84 tablet, Rfl: 5   PARAGARD INTRAUTERINE COPPER IU, by Intrauterine route. Inserted 2 yrs ago at [facility]. (2022), Disp: , Rfl:    Semaglutide, 2 MG/DOSE, (OZEMPIC, 2 MG/DOSE,) 8 MG/3ML SOPN, Inject 2 mg as directed once a week., Disp: 3 mL, Rfl: 1   vitamin B-12 (CYANOCOBALAMIN) 100 MCG tablet, Take 1 tablet (100 mcg total) by mouth daily., Disp: 90 tablet, Rfl: 3   Review of Systems:   Negative unless otherwise specified per HPI.  Vitals:   Vitals:   02/09/24 0908 02/09/24 0944  BP: (!) 146/90 (!) 142/90  Pulse: 89   Temp: 97.7 F (36.5 C)   TempSrc: Temporal   SpO2: 99%   Weight: 275 lb (124.7 kg)   Height: 5' 4.5" (1.638 m)      Body mass index is 46.47 kg/m.  Physical Exam:   Physical Exam Vitals and nursing note reviewed.  Constitutional:      General: She is not in acute distress.    Appearance: She is well-developed. She is not ill-appearing or toxic-appearing.  Cardiovascular:     Rate and Rhythm: Normal rate and regular rhythm.     Pulses: Normal pulses.     Heart sounds: Normal heart sounds, S1 normal and S2 normal.  Pulmonary:     Effort: Pulmonary effort is normal.     Breath sounds: Normal breath sounds.  Skin:    General: Skin is warm and dry.  Neurological:     Mental Status: She is alert.     GCS: GCS eye subscore is 4. GCS verbal subscore is 5. GCS motor subscore is 6.  Psychiatric:        Speech: Speech normal.        Behavior: Behavior normal. Behavior is cooperative.    Results for orders placed or performed in visit on 02/09/24  POCT glycosylated hemoglobin (Hb A1C)  Result Value Ref Range   Hemoglobin A1C 5.6 4.0 - 5.6 %     Assessment and Plan:   Diabetes mellitus  without complication (HCC) (Primary); Long-term current use of injectable noninsulin antidiabetic medication Hemoglobin A1c improved We will continue Ozempic 2 mg weekly Did discuss possibly changing to [facility] to see if this helps her nighttime hunger/cravings or reduce Ozempic dosage, however she would like to trial adding in more protein at bedtime snack and see if this helps Reach out in the  meantime if she wants to change plan of action - POCT glycosylated hemoglobin (Hb A1C)  B12 deficiency Update B12 at next visit and provide recommendations accordingly  Elevated blood pressure reading Above goal today No evidence of end-organ damage on my exam Recommend patient monitor home blood pressure at least a few times weekly Discussed that if blood pressure remains elevated we may need to adjust/stop Adderall and/or oral contraceptive pill(s)  If home monitoring shows consistent elevation, or any symptom(s) develop, recommend reach out to [place] for further advice on next steps  Attention deficit disorder, unspecified type Well controlled per patient Continue Adderall XR 25 mg daily Follow up in 3 months, sooner if concerns  I, Isabelle Course, acting as a Neurosurgeon for Jarold Motto, [place]., have documented all relevant documentation on the behalf of Jarold Motto, [place], as directed by  Jarold Motto, PA while in the presence of Jarold Motto, [place].  I, Jarold Motto, [place], have reviewed all documentation for this visit. The documentation on 02/09/24 for the exam, diagnosis, procedures, and orders are all accurate and complete.  Jarold Motto, PA-C

## 2024-02-09 NOTE — Patient Instructions (Signed)
 It was great to see you!  See if you can get a surgical consult with Dr Karma Greaser   Your blood pressure is elevated in our office today.  I recommend that you monitor this at home.  Your goal blood pressure should be around < 130/80, unless you are over 39 years old, your goal may be closer to 140-150/90. Please note if you have been given other goals from a cardiologist or other healthcare provider, please defer to their recommendations.  When preparing to take your blood pressure: Plan ahead. Don't smoke, drink caffeine or exercise within 30 minutes before taking your blood pressure. Empty your bladder. Don't take the measurement over clothes. Remove the clothing over the arm that will be used to measure blood pressure. You can use either arm unless otherwise told by a healthcare provider. Usually there is not a big difference between readings on them. Be still. Allow at least five minutes of quiet rest before measurements. Don't talk or use the phone. Sit correctly. Sit with your back straight and supported (on a dining chair, rather than a sofa). Your feet should be flat on the floor. Do not cross your legs. Support your arm on a flat surface. The middle of the cuff should be placed on the upper arm at heart level.  Measure at the same time of the day. Take multiple readings and record the results. Each time you measure, take two readings one minute apart. Record the results and bring in to your next office visit.  In order to know how well the medication is working, I would like you to take your readings 1-2 hours after taking your blood pressure medication if possible. Take your blood pressure measurements and record 2-3 days per week.  If you get a high blood pressure reading: A single high reading is not an immediate cause for alarm. If you get a reading that is higher than normal, take your blood pressure a second time. Write down the results of both measurements. Check with your health  care professional to see if there's a health concern or whether there may be problems with your monitor. If your blood pressure readings are suddenly higher than 180/120 mm Hg, wait at least one minute and test again. If your readings are still very high, contact your health care professional immediately. You could be having a hypertensive crisis. Call 911 if your blood pressure is higher than 180/120 mm Hg and if you are having new signs or symptoms that may include: Chest pain Shortness of breath Back pain Numbness Weakness Change in vision Difficulty speaking Confusion Dizziness Vomiting       Take care,  Jarold Motto PA-C

## 2024-02-11 ENCOUNTER — Encounter: Payer: Self-pay | Admitting: Physician Assistant

## 2024-02-11 DIAGNOSIS — N920 Excessive and frequent menstruation with regular cycle: Secondary | ICD-10-CM

## 2024-02-15 ENCOUNTER — Other Ambulatory Visit: Payer: Self-pay

## 2024-02-15 ENCOUNTER — Other Ambulatory Visit: Payer: Self-pay | Admitting: Physician Assistant

## 2024-02-15 ENCOUNTER — Other Ambulatory Visit (HOSPITAL_COMMUNITY): Payer: Self-pay

## 2024-02-15 MED ORDER — CYANOCOBALAMIN 1000 MCG/ML IJ SOLN
INTRAMUSCULAR | 0 refills | Status: DC
  Filled 2024-02-15 (×2): qty 3, 21d supply, fill #0
  Filled 2024-03-23: qty 3, 21d supply, fill #1
  Filled 2024-04-13: qty 3, 21d supply, fill #2
  Filled 2024-05-12: qty 1, 28d supply, fill #3

## 2024-02-23 ENCOUNTER — Other Ambulatory Visit (HOSPITAL_COMMUNITY): Payer: Self-pay

## 2024-02-23 ENCOUNTER — Other Ambulatory Visit: Payer: Self-pay

## 2024-02-29 ENCOUNTER — Other Ambulatory Visit (HOSPITAL_COMMUNITY): Payer: Self-pay

## 2024-03-02 ENCOUNTER — Ambulatory Visit: Payer: Medicaid Other | Admitting: Neurology

## 2024-03-02 ENCOUNTER — Encounter: Payer: Self-pay | Admitting: Neurology

## 2024-03-02 ENCOUNTER — Telehealth: Payer: Self-pay

## 2024-03-02 VITALS — BP 134/84 | Ht 64.0 in | Wt 281.8 lb

## 2024-03-02 DIAGNOSIS — Z8669 Personal history of other diseases of the nervous system and sense organs: Secondary | ICD-10-CM | POA: Diagnosis not present

## 2024-03-02 DIAGNOSIS — G514 Facial myokymia: Secondary | ICD-10-CM

## 2024-03-02 DIAGNOSIS — R202 Paresthesia of skin: Secondary | ICD-10-CM

## 2024-03-02 NOTE — Telephone Encounter (Signed)
 LVM for pt to take her blood pressure and send it in her MyChart messages so we can add to her chart.   Sending MyChart Message

## 2024-03-02 NOTE — Progress Notes (Signed)
 Subjective:    Patient ID: Tanya Dorsey is a 38 y.o. female.  HPI    Huston Foley, MD, PhD [facility] [address] [address] [place], [place] [address]  Dear [name],  I saw your patient, Tanya Dorsey, upon your kind request in my neurologic clinic today for evaluation of her facial paresthesias.  The patient is unaccompanied today.  As you know, [name] is a 38 year old female with an underlying medical history of ADHD, allergies, anxiety, depression, reflux disease, pre-diabetes, history of gestational diabetes, hearing loss, hypertension, heart murmur, hypertension, and morbid obesity with a BMI of 45, who reports tingling in her face for the past several months, symptoms are intermittent.  She has had left lower face twitching in the chin area. She reports a history of migraines for several years, they have been very infrequent, she has not had any recurrent migraines in years.  She had a recent migraine attack in February 2025.  She has tried Imitrex in the past and had side effects but took rizatriptan without repercussions.  She is currently without tingling symptoms or twitching.  She started home injections with B12 every 3 weeks. She does hydrate well with water, about 3 large cups of water per day, 24 ounce size each.  She has trouble maintaining sleep, does not typically help.  In total 1 cup of coffee in the morning.  She does not drink any alcohol typically.  She works as a Paramedic.  She has had work-related stress within the past year but it is better now.  She is up-to-date with her eye examination, had her last eye exam about a year ago and has prescription eyeglasses or contact lenses.  She has had hearing aids bilaterally since 2022 but forgot to put them in today as she left work.  I reviewed your office note from 10/15/2023.  She did report increase in stress at the time.  She also reported not sleeping well. She had blood work  through your office on 10/15/2023 and I reviewed the results: Vitamin B12 was on the low end of normal at 270, she was offered B12 injections.  CBC with differential and platelets showed elevated platelet level at 420.  CMP showed benign findings, random glucose level 120.  TSH normal at 0.95.  She had a brain MRI with without contrast on 11/11/2023 and I reviewed the results:  IMPRESSION: 1. Normal brain MRI. 2. Small bilateral mastoid and middle ear effusions.     In addition, I personally and independently reviewed images through the PACS system. I had evaluated her for sleep related concerns in the past.  She had a home sleep test in 2021 which did not show any significant obstructive sleep apnea.    Previously: 11/10/19: 38 year old right-handed woman with an underlying medical history of ADHD, acid reflux, migraines, prediabetes and morbid obesity with a BMI of over 45, who reports a 1 year history of daytime somnolence.  She feels like she can take a nap despite taking her Adderall.  She takes Adderall XR generic, 10 mg once daily.  She is also on Cymbalta and on Ativan as needed.  She takes the Ativan 0.5 mg strength maybe once a week and is on Cymbalta 20 mg daily.  She reports mild snoring, intermittent and not disturbing to her husband.  She lives with her husband and 72-year-old daughter.  Patient tries to be in bed around 10 and rise time is around 7 or 8.  She works as a Paramedic and currently does virtual visits only.  She has no family history of sleep apnea.  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  She has experienced intermittent restless leg symptoms for years, typically off and on.  She has no night to night nocturia or morning headaches.  She drinks caffeine and limitation, 1 cup of coffee per day, rare alcohol, no smoking. I reviewed your virtual visit note from 10/25/2019.  Her Epworth sleepiness  score is 10 out of 24, fatigue severity score is 37 out of 63.  She had a tonsillectomy in elementary school.    Her Past Medical History Is Significant For: 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   Heart murmur    Hypertension    Prediabetes     Her Past Surgical History Is Significant For: Past Surgical History:  Procedure Laterality Date   CESAREAN SECTION N/A 08/02/2014   Procedure: CESAREAN SECTION;  Surgeon: Esmeralda Arthur, MD;  Location: [facility];  Service: Obstetrics;  Laterality: N/A;   CHOLECYSTECTOMY     TONSILLECTOMY     WISDOM TOOTH EXTRACTION      Her Family History Is Significant For: Family History  Problem Relation Age of Onset   Diabetes Mother    Hypertension Mother    Thyroid disease Mother    Hypertension Father    Thyroid cancer Neg Hx     Her Social History Is Significant For: Social History   Socioeconomic History   Marital status: Married    Spouse name: Not on file   Number of children: 1   Years of education: Not on file   Highest education level: Not on file  Occupational History   Occupation: therapist  Tobacco Use   Smoking status: Never    Passive exposure: Never   Smokeless tobacco: Never  Vaping Use   Vaping status: Never Used  Substance and Sexual Activity   Alcohol use: No   Drug use: No   Sexual activity: Yes    Partners: Male    Birth control/protection: I.U.D.    Comment: Paragard inserted 2022, menarche 38yo, sexual debut 38yo  Other Topics Concern   Not on file  Social History Narrative   Mental Health counselor   Married   1 child -- 71 yo girl   Social Drivers of Corporate investment banker Strain: Not on file  Food Insecurity: Not on file  Transportation Needs: Not on file  Physical Activity: Not on  file  Stress: Not on file  Social Connections: Not on file    Her Allergies Are:  No Known Allergies:   Her Current Medications Are:  Outpatient Encounter Medications as of 03/02/2024  Medication Sig   amphetamine-dextroamphetamine (ADDERALL XR) 25 MG 24 hr capsule Take 1 capsule by mouth every morning.   buPROPion (WELLBUTRIN SR) 150 MG 12 hr tablet Take 1 tablet (150 mg total) by mouth 2 (two) times daily.   cholecalciferol (VITAMIN D3) 25 MCG (1000 UNIT) tablet Take 1,000 Units by mouth daily.   Continuous Glucose Sensor (FREESTYLE LIBRE 3 SENSOR) MISC APPLY TO SKIN AND CHANGE EVERY  14 DAYS   cyanocobalamin (VITAMIN B12) 1000 MCG/ML injection Inject 1 ml into the muscle for weekly for 3 weeks then monthly   DULoxetine (CYMBALTA) 60 MG capsule Take 1 capsule (60 mg total) by mouth daily.   LORazepam (ATIVAN) 0.5 MG tablet Take 1 tablet (0.5 mg total) by mouth daily as needed.   norgestimate-ethinyl estradiol (ORTHO-CYCLEN) 0.25-35 MG-MCG tablet Take 1 tablet by mouth daily skip reminder pills   PARAGARD INTRAUTERINE COPPER IU by Intrauterine route. Inserted 2 yrs ago at [facility]. (2022)   Semaglutide, 2 MG/DOSE, (OZEMPIC, 2 MG/DOSE,) 8 MG/3ML SOPN Inject 2 mg as directed once a week.   vitamin B-12 (CYANOCOBALAMIN) 100 MCG tablet Take 1 tablet (100 mcg total) by mouth daily.   Accu-Chek Softclix Lancets lancets USE TO CHECK BLOOD SUGARS THREE TO FOUR TIMES A DAY (Patient not taking: Reported on 03/02/2024)   [START ON 03/10/2024] amphetamine-dextroamphetamine (ADDERALL XR) 25 MG 24 hr capsule Take 1 capsule by mouth every morning. (Patient not taking: Reported on 03/02/2024)   [START ON 04/09/2024] amphetamine-dextroamphetamine (ADDERALL XR) 25 MG 24 hr capsule Take 1 capsule by mouth every morning. (Patient not taking: Reported on 03/02/2024)   Blood Glucose Calibration (ACCU-CHEK GUIDE CONTROL) LIQD USE TO CHECK TEST STRIPS AS DIRECTED (Patient not taking: Reported on 03/02/2024)   Blood  Glucose Monitoring Suppl (ACCU-CHEK GUIDE) w/Device KIT USE TO CHECK BLOOD SUGARS (Patient not taking: Reported on 03/02/2024)   glucose blood (ACCU-CHEK GUIDE) test strip USE TO CHECK BLOOD SUGARS THREE TO FOUR TIMES A DAY. (Patient not taking: Reported on 03/02/2024)   No facility-administered encounter medications on file as of 03/02/2024.  :   Review of Systems:  Out of a complete 14 point review of systems, all are reviewed and negative with the exception of these symptoms as listed below:  Review of Systems  Neurological:        Room 9 Pt is here Alone. Pt states that she had Migraine that caused her face to droop on her right side. Pt states she doesn't have the numbness in her face anymore. Pt states that she would like to discuss her Migraines and the drooping that she has with them. ESS 8 FSS 22    Objective:  Neurological Exam  Physical Exam Physical Examination:   We could not get a blood pressure reviewed with the electric blood pressure cuff.  We will call patient and see if she can get a blood pressure at home, we were supposed to check her manual blood pressure before she left today but I let her go before a blood pressure recheck. She denies any orthostatic dizziness or lightheadedness or vertiginous symptoms.  General Examination: The patient is a very pleasant 38 y.o. female in no acute distress. She appears well-developed and well-nourished and well groomed.   HEENT: Normocephalic, atraumatic, pupils are equal, round and reactive to light, extraocular tracking is good without limitation to gaze excursion or nystagmus noted.  No photophobia, funduscopic exam normal.  Hearing is grossly intact, no hearing aids. Face is symmetric with normal facial animation and normal facial sensation to light touch, temperature and vibration sense. Speech is clear with no dysarthria noted. There is no hypophonia. There is no lip, neck/head, jaw or voice tremor. Neck with FROM.  There are no  carotid bruits on auscultation. Oropharynx exam reveals: moderate mouth dryness, adequate dental hygiene and mild airway crowding, tongue protrudes centrally in palate elevates symmetrically, good side-to-side tongue movements.  No involuntary muscle twitching  noted.    Chest: Clear to auscultation without wheezing, rhonchi or crackles noted.   Heart: S1+S2+0, regular and normal without murmurs, rubs or gallops noted.    Abdomen: Soft, non-tender and non-distended.   Extremities: There is no pitting edema in the distal lower extremities bilaterally.    Skin: Warm and dry without trophic changes noted.    Musculoskeletal: exam reveals no obvious joint deformities.    Neurologically:  Mental status: The patient is awake, alert and oriented in all 4 spheres. Her immediate and remote memory, attention, language skills and fund of knowledge are appropriate. There is no evidence of aphasia, agnosia, apraxia or anomia. Speech is clear with normal prosody and enunciation. Thought process is linear. Mood is normal and affect is normal.  Cranial nerves II - XII are as described above under HEENT exam.  Motor exam: Normal bulk, strength and tone is noted. There is no resting, postural or action tremor, Romberg is negative. Fine motor skills and coordination: intact in the upper and lower extremities.  Cerebellar testing: No dysmetria or intention tremor. There is no truncal or gait ataxia.  Normal finger-to-nose, normal heel-to-shin bilaterally. Sensory exam: intact to light touch in the upper and lower extremities.  Gait, station and balance: She stands easily. No veering to one side is noted. No leaning to one side is noted. Posture is age-appropriate and stance is narrow based. Gait shows normal stride length and normal pace. No problems turning are noted. Tandem walk is unremarkable.                 Assessment and Plan:  In summary, Tanya Dorsey is a 38 year old female with an underlying medical  history of ADHD, allergies, anxiety, depression, reflux disease, pre-diabetes, history of gestational diabetes, hearing loss, hypertension, heart murmur, hypertension, and morbid obesity with a BMI of 45, who presents for evaluation of intermittent facial tingling and twitching since approximately December 2024, no recent symptoms, feels better since she started B12 injections. A repeat blood test is planned.  Neurological exam is nonfocal.  She is largely reassured today.  We talked about headache triggers today as she also reports a history of migraines but thankfully she has had infrequent migraines.  She is encouraged to talk to about trying rizatriptan again if need be.  Also, Cymbalta could be increased if migraines become more frequent.  We talked about the importance of stress reduction and trying to get enough sleep.  She is up-to-date with her eye examination.  She is aware that stimulants can exacerbate headaches. If possible, we will ask her to check her blood pressure at home as we could not get a read today by automatic blood pressure cuff. I had planned to get a manual blood pressure at the end of our encounter but then I let her go before this could happen.  Previous blood pressure read was borderline high in March 2025 but normal in December 2024 in November 2024. At this juncture, she is advised to follow-up with you as scheduled.  We do not need to make a scheduled follow-up appointment in this clinic.  I would be happy to see her as needed.  I answered all her questions today and she was in agreement. Thank you very much for allowing me to participate in the care of this nice patient. If I can be of any further assistance to you please do not hesitate to call me at [phone].  Sincerely,   Huston Foley, MD, PhD

## 2024-03-10 ENCOUNTER — Other Ambulatory Visit: Payer: Self-pay

## 2024-03-10 ENCOUNTER — Other Ambulatory Visit (HOSPITAL_COMMUNITY): Payer: Self-pay

## 2024-03-12 ENCOUNTER — Other Ambulatory Visit (HOSPITAL_COMMUNITY): Payer: Self-pay

## 2024-03-23 ENCOUNTER — Other Ambulatory Visit: Payer: Self-pay

## 2024-03-23 ENCOUNTER — Other Ambulatory Visit (HOSPITAL_COMMUNITY): Payer: Self-pay

## 2024-03-23 LAB — HM DIABETES EYE EXAM

## 2024-03-30 ENCOUNTER — Encounter: Payer: Self-pay | Admitting: Physician Assistant

## 2024-03-30 DIAGNOSIS — E119 Type 2 diabetes mellitus without complications: Secondary | ICD-10-CM

## 2024-04-05 ENCOUNTER — Ambulatory Visit (INDEPENDENT_AMBULATORY_CARE_PROVIDER_SITE_OTHER): Admitting: Obstetrics and Gynecology

## 2024-04-05 ENCOUNTER — Encounter: Payer: Self-pay | Admitting: Obstetrics and Gynecology

## 2024-04-05 VITALS — BP 120/80 | HR 90 | Ht 65.25 in | Wt 278.0 lb

## 2024-04-05 DIAGNOSIS — N946 Dysmenorrhea, unspecified: Secondary | ICD-10-CM

## 2024-04-05 DIAGNOSIS — N938 Other specified abnormal uterine and vaginal bleeding: Secondary | ICD-10-CM

## 2024-04-05 DIAGNOSIS — N921 Excessive and frequent menstruation with irregular cycle: Secondary | ICD-10-CM

## 2024-04-05 NOTE — Progress Notes (Signed)
 Tanya Dorsey Apr 17, 1986 [id number]   History:  38 y.o. G 1p0101 presents for annual exam. Patient with heavy painful periods. Husband with vasectomy. Has accepted non binary process and having periods that are all over are bothersome with testosterone . Tried ocp's with the paragard and couldn't take them with her anxiety and CHTN.  Reports her blood pressure elevates with any estrogen ocp's She does not want any more hormones and would like a definitive management with the [facility]. She is frustrated with the pain and bleeding and would like a better quality of life as well.  She had a horrible pregnancy with LTCs with cholestasis of pregnancy, post partum preeclampsia and GDM.  She does nto want to be pregnant  Gynecologic History Patient's last menstrual period was 04/05/2024 (exact date). Period Duration (Days): 7 Period Pattern: Regular Menstrual Flow: Heavy, Moderate Menstrual Control: Maxi pad, Tampon Dysmenorrhea: (!) Moderate Dysmenorrhea Symptoms: Cramping (pms symptoms)   Health Maintenance   Past medical history, past surgical history, family history and social history were all reviewed and documented in the EPIC chart. OB History     Gravida  1   Para  1   Term  1   Preterm  0   AB  0   Living  1      SAB  0   IAB  0   Ectopic  0   Multiple  0   Live Births  1           Past Surgical History:  Procedure Laterality Date   CESAREAN SECTION N/A 08/02/2014   Procedure: CESAREAN SECTION;  Surgeon: Mckinley Spells, MD;  Location: [facility];  Service: Obstetrics;  Laterality: N/A;   CHOLECYSTECTOMY     TONSILLECTOMY     WISDOM TOOTH EXTRACTION     Social History   Socioeconomic History   Marital status: Married    Spouse name: Not on file   Number of children: 1   Years of education: Not on file   Highest education level: Not on file  Occupational History   Occupation: therapist  Tobacco Use   Smoking status: Never    Passive exposure: Never    Smokeless tobacco: Never  Vaping Use   Vaping status: Never Used  Substance and Sexual Activity   Alcohol use: No   Drug use: No   Sexual activity: Yes    Partners: Male    Birth control/protection: I.U.D.    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  Exam:  Vitals:   04/05/24 0839  BP: 120/80  Pulse: 90  SpO2: 99%  Weight: 278 lb (126.1 kg)  Height: 5' 5.25" (1.657 m)   Body mass index is 45.91 kg/m.  OBGyn Exam  Assessment/Plan:  38 y.o.  for DUB, menorrhagia, dysmenorrhea with current paragard IUD desires definitive management with the [facility] and is here today for surgical consult Counseled on all options such as hormonal, ablation, IUD.   She would like  to have the [facility].  Counseled extensively on the procedure including but not limited to what to expect and risks and benefits.  Counseled on postop care and pelvic rest for 10 weeks after the surgery with restricted lifting for 6 weeks after.  Counseled on the benefits of the robotic procedure with faster return to daily activities, improved outcomes, and less risk for complications. She would like to have this scheduled.  She will need to have EMB and PUS completed.  Counseled on the procedure and what to expect.  She will schedule this at the front desk. 30 minutes spent on reviewing records, imaging,  and one on one patient time and counseling patient and documentation Dr. Caro Christmas

## 2024-04-13 ENCOUNTER — Other Ambulatory Visit: Payer: Self-pay

## 2024-04-13 ENCOUNTER — Other Ambulatory Visit (HOSPITAL_COMMUNITY): Payer: Self-pay

## 2024-04-13 ENCOUNTER — Other Ambulatory Visit: Payer: Self-pay | Admitting: Physician Assistant

## 2024-04-13 MED ORDER — OZEMPIC (2 MG/DOSE) 8 MG/3ML ~~LOC~~ SOPN
2.0000 mg | PEN_INJECTOR | SUBCUTANEOUS | 1 refills | Status: DC
  Filled 2024-04-13: qty 3, 28d supply, fill #0
  Filled 2024-05-12: qty 3, 28d supply, fill #1

## 2024-04-14 ENCOUNTER — Other Ambulatory Visit: Payer: Self-pay

## 2024-04-22 ENCOUNTER — Other Ambulatory Visit (HOSPITAL_COMMUNITY): Payer: Self-pay

## 2024-04-22 ENCOUNTER — Other Ambulatory Visit: Payer: Self-pay

## 2024-05-03 ENCOUNTER — Other Ambulatory Visit

## 2024-05-03 ENCOUNTER — Other Ambulatory Visit: Admitting: Obstetrics and Gynecology

## 2024-05-10 ENCOUNTER — Ambulatory Visit (INDEPENDENT_AMBULATORY_CARE_PROVIDER_SITE_OTHER): Admitting: Obstetrics and Gynecology

## 2024-05-10 ENCOUNTER — Encounter: Payer: Self-pay | Admitting: Obstetrics and Gynecology

## 2024-05-10 ENCOUNTER — Ambulatory Visit (INDEPENDENT_AMBULATORY_CARE_PROVIDER_SITE_OTHER)

## 2024-05-10 ENCOUNTER — Other Ambulatory Visit (HOSPITAL_COMMUNITY)
Admission: RE | Admit: 2024-05-10 | Discharge: 2024-05-10 | Disposition: A | Discharge status: Home / Self Care | Source: Ambulatory Visit | Attending: Obstetrics and Gynecology | Admitting: Obstetrics and Gynecology

## 2024-05-10 VITALS — BP 124/90 | HR 93

## 2024-05-10 DIAGNOSIS — N921 Excessive and frequent menstruation with irregular cycle: Secondary | ICD-10-CM | POA: Insufficient documentation

## 2024-05-10 DIAGNOSIS — N930 Postcoital and contact bleeding: Secondary | ICD-10-CM | POA: Diagnosis present

## 2024-05-10 DIAGNOSIS — N83201 Unspecified ovarian cyst, right side: Secondary | ICD-10-CM

## 2024-05-10 DIAGNOSIS — Z01812 Encounter for preprocedural laboratory examination: Secondary | ICD-10-CM | POA: Diagnosis not present

## 2024-05-10 DIAGNOSIS — D500 Iron deficiency anemia secondary to blood loss (chronic): Secondary | ICD-10-CM

## 2024-05-10 DIAGNOSIS — N946 Dysmenorrhea, unspecified: Secondary | ICD-10-CM

## 2024-05-10 DIAGNOSIS — N938 Other specified abnormal uterine and vaginal bleeding: Secondary | ICD-10-CM | POA: Insufficient documentation

## 2024-05-10 LAB — PREGNANCY, URINE: Preg Test, Ur: NEGATIVE

## 2024-05-10 NOTE — Progress Notes (Addendum)
 Tanya Dorsey 12/07/1985 [id number]   History:  38 y.o. G 1p0101 presents for EMB, pap smear prior to having the [facility] Patient with heavy painful periods. Husband with vasectomy. Has accepted non binary process and having periods that are all over are bothersome with testosterone . Tried ocp's with the paragard and couldn't take them with her anxiety and CHTN.  Reports her blood pressure elevates with any estrogen ocp's She does not want any more hormones and would like a definitive management with the [facility] She is frustrated with the pain and bleeding and would like a better quality of life as well. Medicaid papers reviewed and consent done,. Counseled that the hysterectomy is a permanent procedure and she cannot conceive in the future.  She is in her right mind and voices understanding.  She had a horrible pregnancy with LTCs with cholestasis of pregnancy, post partum preeclampsia and GDM.  She does nto want to be pregnant  Gynecologic History Patient's last menstrual period was 04/29/2024 (exact date).     Health Maintenance   Past medical history, past surgical history, family history and social history were all reviewed and documented in the EPIC chart. OB History     Gravida  1   Para  1   Term  1   Preterm  0   AB  0   Living  1      SAB  0   IAB  0   Ectopic  0   Multiple  0   Live Births  1           Past Surgical History:  Procedure Laterality Date   CESAREAN SECTION N/A 08/02/2014   Procedure: CESAREAN SECTION;  Surgeon: Mckinley Spells, MD;  Location: [facility];  Service: Obstetrics;  Laterality: N/A;   CHOLECYSTECTOMY     TONSILLECTOMY     WISDOM TOOTH EXTRACTION     Social History   Socioeconomic History   Marital status: Married    Spouse name: Not on file   Number of children: 1   Years of education: Not on file   Highest education level: Not on file  Occupational History   Occupation: therapist  Tobacco Use   Smoking status: Never     Passive exposure: Never   Smokeless tobacco: Never  Vaping Use   Vaping status: Never Used  Substance and Sexual Activity   Alcohol use: No   Drug use: No   Sexual activity: Yes    Partners: Male    Birth control/protection: I.U.D.    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  Exam:  Vitals:   05/10/24 1003 05/10/24 1009  BP: (!) 124/90 (!) 124/90  Pulse: 93   SpO2: 99%    There is no height or weight on file to calculate BMI.  OBGyn Exam PUS 10.42cm uterus 7.14cm right ovarian simple cyst LO not seen Normal EML No free fluid   Assessment/Plan:  38 y.o.  for DUB, menorrhagia,  dysmenorrhea with current paragard IUD desires definitive management with the [facility] and is here today for surgical consult  PUS with right ovarian cyst.   Likely adenomyosis EMB collected today H/o cesarean section IUD in place  EMB, pap and cultures collected today. All questions answered on the [facility]. She is ready to schedule the procedure. Counseled on the PUS resutls. Simple 7cm cyst and IUD was seen.  Patient would like to have a cystectomy and IUD removal at the same time.  Discussed cyst appeared simple, which is good, but to still get ca125 today. She agreed. Also with B12 deficiency. Repeat labs sent.  Dr. Elisha Guillaume Hyacinth Mail

## 2024-05-11 ENCOUNTER — Ambulatory Visit: Payer: Self-pay | Admitting: Obstetrics and Gynecology

## 2024-05-11 ENCOUNTER — Encounter: Payer: Self-pay | Admitting: Obstetrics and Gynecology

## 2024-05-11 LAB — CA 125: CA 125: 11 U/mL (ref <35)

## 2024-05-11 LAB — SURESWAB® ADVANCED VAGINITIS PLUS,TMA
C. trachomatis RNA, TMA: NOT DETECTED
CANDIDA SPECIES: NOT DETECTED
Candida glabrata: NOT DETECTED
N. gonorrhoeae RNA, TMA: NOT DETECTED
SURESWAB(R) ADV BACTERIAL VAGINOSIS(BV),TMA: POSITIVE — AB
TRICHOMONAS VAGINALIS (TV),TMA: NOT DETECTED

## 2024-05-11 LAB — CYTOLOGY - PAP: Diagnosis: NEGATIVE

## 2024-05-12 ENCOUNTER — Other Ambulatory Visit (HOSPITAL_COMMUNITY): Payer: Self-pay

## 2024-05-12 ENCOUNTER — Other Ambulatory Visit: Payer: Self-pay

## 2024-05-12 ENCOUNTER — Ambulatory Visit: Admitting: Physician Assistant

## 2024-05-12 LAB — SURGICAL PATHOLOGY

## 2024-05-12 MED ORDER — CLINDAMYCIN PHOSPHATE (1 DOSE) 2 % VA CREA
1.0000 | TOPICAL_CREAM | Freq: Once | VAGINAL | 0 refills | Status: AC
  Filled 2024-05-12: qty 5, 1d supply, fill #0

## 2024-05-13 ENCOUNTER — Other Ambulatory Visit (HOSPITAL_COMMUNITY): Payer: Self-pay

## 2024-05-18 ENCOUNTER — Encounter: Payer: Self-pay | Admitting: Physician Assistant

## 2024-05-18 ENCOUNTER — Other Ambulatory Visit (HOSPITAL_COMMUNITY): Payer: Self-pay

## 2024-05-18 ENCOUNTER — Ambulatory Visit: Admitting: Physician Assistant

## 2024-05-18 VITALS — BP 138/80 | HR 81 | Temp 98.4°F | Ht 65.25 in | Wt 284.2 lb

## 2024-05-18 DIAGNOSIS — F32A Depression, unspecified: Secondary | ICD-10-CM

## 2024-05-18 DIAGNOSIS — D2370 Other benign neoplasm of skin of unspecified lower limb, including hip: Secondary | ICD-10-CM | POA: Insufficient documentation

## 2024-05-18 DIAGNOSIS — F419 Anxiety disorder, unspecified: Secondary | ICD-10-CM

## 2024-05-18 DIAGNOSIS — Z7985 Long-term (current) use of injectable non-insulin antidiabetic drugs: Secondary | ICD-10-CM

## 2024-05-18 DIAGNOSIS — H1013 Acute atopic conjunctivitis, bilateral: Secondary | ICD-10-CM | POA: Diagnosis not present

## 2024-05-18 DIAGNOSIS — F988 Other specified behavioral and emotional disorders with onset usually occurring in childhood and adolescence: Secondary | ICD-10-CM

## 2024-05-18 DIAGNOSIS — H9202 Otalgia, left ear: Secondary | ICD-10-CM | POA: Diagnosis not present

## 2024-05-18 DIAGNOSIS — Z23 Encounter for immunization: Secondary | ICD-10-CM | POA: Diagnosis not present

## 2024-05-18 DIAGNOSIS — E119 Type 2 diabetes mellitus without complications: Secondary | ICD-10-CM | POA: Diagnosis not present

## 2024-05-18 DIAGNOSIS — D225 Melanocytic nevi of trunk: Secondary | ICD-10-CM | POA: Insufficient documentation

## 2024-05-18 MED ORDER — AMOXICILLIN 875 MG PO TABS
875.0000 mg | ORAL_TABLET | Freq: Two times a day (BID) | ORAL | 0 refills | Status: AC
  Filled 2024-05-18: qty 14, 7d supply, fill #0

## 2024-05-18 MED ORDER — OLOPATADINE HCL 0.2 % OP SOLN
1.0000 [drp] | Freq: Two times a day (BID) | OPHTHALMIC | 0 refills | Status: AC
  Filled 2024-05-18: qty 2.5, 25d supply, fill #0

## 2024-05-18 MED ORDER — AMPHETAMINE-DEXTROAMPHET ER 25 MG PO CP24
25.0000 mg | ORAL_CAPSULE | ORAL | 0 refills | Status: DC
  Filled 2024-05-18 – 2024-05-24 (×4): qty 30, 30d supply, fill #0
  Filled ????-??-?? (×2): fill #0

## 2024-05-18 MED ORDER — TIRZEPATIDE 7.5 MG/0.5ML ~~LOC~~ SOAJ
7.5000 mg | SUBCUTANEOUS | 1 refills | Status: DC
  Filled 2024-05-18 – 2024-05-20 (×5): qty 2, 28d supply, fill #0
  Filled 2024-06-09 – 2024-06-16 (×2): qty 2, 28d supply, fill #1

## 2024-05-18 MED ORDER — AMPHETAMINE-DEXTROAMPHET ER 25 MG PO CP24
25.0000 mg | ORAL_CAPSULE | ORAL | 0 refills | Status: DC
  Filled 2024-08-06: qty 30, 30d supply, fill #0

## 2024-05-18 MED ORDER — DULOXETINE HCL 20 MG PO CPEP
20.0000 mg | ORAL_CAPSULE | Freq: Two times a day (BID) | ORAL | 0 refills | Status: DC
  Filled 2024-05-18: qty 90, 45d supply, fill #0

## 2024-05-18 MED ORDER — VILAZODONE HCL 20 MG PO TABS
20.0000 mg | ORAL_TABLET | Freq: Every day | ORAL | 1 refills | Status: DC
  Filled 2024-05-18: qty 30, 30d supply, fill #0
  Filled 2024-06-09 – 2024-06-10 (×2): qty 30, 30d supply, fill #1

## 2024-05-18 MED ORDER — AMPHETAMINE-DEXTROAMPHET ER 25 MG PO CP24
25.0000 mg | ORAL_CAPSULE | ORAL | 0 refills | Status: DC
  Filled 2024-06-28 – 2024-06-30 (×2): qty 30, 30d supply, fill #0

## 2024-05-18 NOTE — Progress Notes (Signed)
 Tanya Dorsey is a 38 y.o. female here for a new problem.  History of Present Illness:   Chief Complaint  Patient presents with   Medication taper    Pt is here to discuss tapering off of Cymbalta .   Eye Problem    Pt c/o bilateral eyes, itching red and drainage in the morning, x 1 week.   Ear Pain    Pt c/o left ear pain x 3-4 days.    HPI  Anxiety and depression Pt is on Cymbalta  60 mg for depression. She states she saw Dr. Shyrl of psychiatry for a medication taper consult, but her insurance will not cover any medications from psych due to being out of network. Per pt, she reports she was advised to alternate Cymbalta  40 mg and Cymbalta  60 mg each day. She also reports she would decrease her dose over 1.5-2 week increments. Pt states she would like to get Cymbalta  20 mg and adjust her dose from there. Pt would then either stop Cymbalta  completely or, if she starts feeling worse, switch to Viibryd. Per pt, psych advised to start Viibryd at 20 mg with a max dose of 40 mg. She would like to establish her new regimen by August 1 due to her upcoming hysterectomy. Pt notes good tolerance with Adderall and is compliant overall with the rest of her medications, but has not yet taken them today.  Diabetes Pt is on Ozempic  2 mg weekly. She reports good clinical effect initially, but she now no longer has a decreased appetite. She states she feels like eating a bag of sugar after eating. Pt is agreeable to starting Mounjaro.  Eye problem Pt complains of itching in bilateral eyes with associated redness and drainage starting 1 week ago.  The itching worsens when she is outside or in the pool. Today, she reports she could not wear contacts due to her eye problem Endorses trying Xyzal, Benadryl  at night, lubricant eye drops, and wearing contacts. She notes she has new contacts on the way.  Ear pain Pt complains of left ear pain starting 3-4 days ago.  She reports the pain does not  feel severe, but it feels like there is fluid in her left ear. Upon examination, her left ear has redness and fluid. Denies increased nasal congestion, sore throat, or getting water in her ears regularly.  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   Heart murmur    Hypertension    Prediabetes      Social History   Tobacco Use   Smoking status: Never    Passive exposure: Never   Smokeless tobacco: Never  Vaping Use   Vaping status: Never Used  Substance Use Topics   Alcohol use: No   Drug use: No    Past Surgical History:  Procedure Laterality Date   CESAREAN SECTION N/A 08/02/2014   Procedure: CESAREAN SECTION;  Surgeon: Nena DELENA App, MD;  Location: [facility];  Service: Obstetrics;  Laterality: N/A;   CHOLECYSTECTOMY     TONSILLECTOMY     WISDOM TOOTH EXTRACTION      Family History  Problem Relation Age of Onset   Diabetes Mother    Hypertension Mother    Thyroid  disease Mother    Hypertension Father    Thyroid cancer Neg Hx     No Known Allergies  Current Medications:   Current Outpatient Medications:    amoxicillin  (AMOXIL ) 875 MG tablet, Take 1 tablet (875 mg total) by mouth 2 (two) times daily for 7 days., Disp: 14 tablet, Rfl: 0   amphetamine -dextroamphetamine  (ADDERALL XR) 25 MG 24 hr capsule, Take 1 capsule by mouth every morning., Disp: 30 capsule, Rfl: 0   [START ON 06/17/2024] amphetamine -dextroamphetamine  (ADDERALL XR) 25 MG 24 hr capsule, Take 1 capsule by mouth every morning., Disp: 30 capsule, Rfl: 0   [START ON 07/17/2024] amphetamine -dextroamphetamine  (ADDERALL XR) 25 MG 24 hr capsule, Take 1 capsule by mouth every morning., Disp: 30 capsule, Rfl: 0   buPROPion  (WELLBUTRIN  SR) 150 MG 12 hr tablet, Take 1 tablet (150 mg total) by mouth 2 (two)  times daily., Disp: 180 tablet, Rfl: 1   cholecalciferol (VITAMIN D3) 25 MCG (1000 UNIT) tablet, Take 1,000 Units by mouth daily., Disp: , Rfl:    Continuous Glucose Sensor (FREESTYLE LIBRE 3 SENSOR) MISC, APPLY TO SKIN AND CHANGE EVERY 14 DAYS, Disp: 2 each, Rfl: 2   cyanocobalamin  (VITAMIN B12) 1000 MCG/ML injection, Inject 1 ml into the muscle for weekly for 3 weeks then monthly, Disp: 10 mL, Rfl: 0   DULoxetine  (CYMBALTA ) 20 MG capsule, Take 1 capsule (20 mg total) by mouth 2 (two) times daily., Disp: 90 capsule, Rfl: 0   LORazepam  (ATIVAN ) 0.5 MG tablet, Take 1 tablet (0.5 mg total) by mouth daily as needed., Disp: 20 tablet, Rfl: 0   Olopatadine HCl 0.2 % SOLN, Apply 1 drop to eye 2 (two) times daily., Disp: 2.5 mL, Rfl: 0   PARAGARD INTRAUTERINE COPPER IU, by Intrauterine route. Inserted 2 yrs ago at [facility]. (2022), Disp: , Rfl:    tirzepatide (MOUNJARO) 7.5 MG/0.5ML Pen, Inject 7.5 mg into the skin once a week., Disp: 2 mL, Rfl: 1   Vilazodone HCl 20 MG TABS, Take 1 tablet (20 mg total) by mouth daily., Disp: 30 tablet, Rfl: 1   vitamin B-12 (CYANOCOBALAMIN ) 100 MCG tablet, Take 1 tablet (100 mcg total) by mouth daily., Disp: 90 tablet, Rfl: 3   Review of Systems:   Negative unless otherwise specified per HPI.  Vitals:   Vitals:   05/18/24 0806  BP: 138/80  Pulse: 81  Temp: 98.4 F (36.9 C)  TempSrc: Temporal  SpO2: 98%  Weight: 284 lb 4 oz (128.9 kg)  Height: 5' 5.25 (1.657 m)     Body mass index is 46.94 kg/m.  Physical Exam:   Physical Exam Vitals and nursing note reviewed.  Constitutional:      General: She is not in acute distress.    Appearance: She is well-developed. She is not ill-appearing or toxic-appearing.  HENT:     Head: Normocephalic and atraumatic.     Right Ear: Tympanic membrane, ear canal and external ear normal. Tympanic membrane is not erythematous, retracted or bulging.     Left Ear: Ear canal and external ear normal. A middle ear  effusion is present. Tympanic membrane is erythematous. Tympanic membrane is not retracted or bulging.     Nose: Nose normal.     Right Sinus: No maxillary sinus tenderness or frontal sinus tenderness.     Left Sinus: No maxillary sinus tenderness or frontal sinus tenderness.     Mouth/Throat:     Pharynx: Uvula midline. No posterior oropharyngeal erythema.   Eyes:  General: Lids are normal.     Conjunctiva/sclera: Conjunctivae normal.   Neck:     Trachea: Trachea normal.   Cardiovascular:     Rate and Rhythm: Normal rate and regular rhythm.     Pulses: Normal pulses.     Heart sounds: Normal heart sounds, S1 normal and S2 normal.  Pulmonary:     Effort: Pulmonary effort is normal.     Breath sounds: Normal breath sounds. No decreased breath sounds, wheezing, rhonchi or rales.  Lymphadenopathy:     Cervical: No cervical adenopathy.   Skin:    General: Skin is warm and dry.   Neurological:     Mental Status: She is alert.     GCS: GCS eye subscore is 4. GCS verbal subscore is 5. GCS motor subscore is 6.   Psychiatric:        Speech: Speech normal.        Behavior: Behavior normal. Behavior is cooperative.     Assessment and Plan:   1. Attention deficit disorder, unspecified type (Primary) Well-controlled Continue Adderall XR 25 mg daily PDMP reviewed, no red flags Follow-up in 3 months  2. Anxiety and depression No red flags She would like to try something new to see if she can have less sexual side effects I have sent in Cymbalta  20 mg capsules for her to decrease as instructed by her psychiatrist and then eventually stop and start Viibryd I have sent in Viibryd 20 mg for her to start when she is ready for this transition Follow-up in 3 months, sooner if concerns  3. Acute atopic conjunctivitis of both eyes No evidence of infection on exam, no need for oral antibiotics for this issue I have resent in some Pataday drops, for her to use as needed Continue to  avoid use of contacts if irritating  4. Diabetes mellitus without complication (HCC) Suspect A1c is well-controlled Discussed transitioning to Mounjaro to see if get better food noise benefit She is agreeable to trialing this, will send in 7.5 mg Follow-up in 3 months, sooner if concerns  5. Left ear pain No red flags on exam.   Will initiate amoxicilin per orders.  Discussed taking medications as prescribed.  Reviewed return precautions including new or worsening fever, SOB, new or worsening cough or other concerns.  Push fluids and rest.  I recommend that patient follow-up if symptoms worsen or persist despite treatment x 7-10 days, sooner if needed.   I, Lavern Simmers, acting as a Neurosurgeon for [employer], [place]., have documented all relevant documentation on the behalf of Lucie Buttner, [place], as directed by Lucie Buttner, PA while in the presence of Lucie Buttner, [place].  I, Lucie Buttner, [place], have reviewed all documentation for this visit. The documentation on 05/18/24 for the exam, diagnosis, procedures, and orders are all accurate and complete.  Lucie Buttner, PA-C

## 2024-05-18 NOTE — Addendum Note (Signed)
 Addended by: THURMON ARLAND PARAS on: 05/18/2024 08:55 AM   Modules accepted: Orders

## 2024-05-19 ENCOUNTER — Other Ambulatory Visit (HOSPITAL_COMMUNITY): Payer: Self-pay

## 2024-05-20 ENCOUNTER — Other Ambulatory Visit (HOSPITAL_COMMUNITY): Payer: Self-pay

## 2024-05-20 ENCOUNTER — Other Ambulatory Visit: Payer: Self-pay

## 2024-05-20 ENCOUNTER — Telehealth: Payer: Self-pay

## 2024-05-20 NOTE — Telephone Encounter (Signed)
 Pharmacy Patient Advocate Encounter   Received notification from CoverMyMeds that prior authorization for Mounjaro  7.5 is required/requested.   Insurance verification completed.   The patient is insured through [facility] .   Per test claim: PA required and submitted KEY/EOC/Request #: [id number] from 05/20/24 to 05/20/25.

## 2024-05-24 ENCOUNTER — Encounter: Payer: Self-pay | Admitting: Physician Assistant

## 2024-05-24 ENCOUNTER — Other Ambulatory Visit: Payer: Self-pay

## 2024-05-24 ENCOUNTER — Other Ambulatory Visit (HOSPITAL_COMMUNITY): Payer: Self-pay

## 2024-05-25 ENCOUNTER — Other Ambulatory Visit (HOSPITAL_COMMUNITY): Payer: Self-pay

## 2024-05-25 MED ORDER — FREESTYLE LIBRE 3 PLUS SENSOR MISC
2 refills | Status: DC
  Filled 2024-05-25: qty 2, 30d supply, fill #0
  Filled 2024-06-09 – 2024-07-08 (×5): qty 2, 30d supply, fill #1
  Filled 2024-08-06 – 2024-08-23 (×2): qty 2, 30d supply, fill #2

## 2024-05-26 ENCOUNTER — Ambulatory Visit: Admitting: Dietician

## 2024-06-02 ENCOUNTER — Other Ambulatory Visit (HOSPITAL_COMMUNITY): Payer: Self-pay

## 2024-06-03 ENCOUNTER — Telehealth: Payer: Self-pay | Admitting: Pharmacy Technician

## 2024-06-03 NOTE — Telephone Encounter (Signed)
 Pharmacy Patient Advocate Encounter   Received notification from CoverMyMeds that prior authorization for FreeStyle Libre 2 Sensor is due for renewal.   Insurance verification completed.   The patient is insured through [facility].  Action: Medication has been discontinued. Archived Key: [id number]

## 2024-06-09 ENCOUNTER — Other Ambulatory Visit: Payer: Self-pay

## 2024-06-09 ENCOUNTER — Other Ambulatory Visit: Payer: Self-pay | Admitting: Physician Assistant

## 2024-06-09 ENCOUNTER — Other Ambulatory Visit (HOSPITAL_COMMUNITY): Payer: Self-pay

## 2024-06-09 MED ORDER — BUPROPION HCL ER (SR) 150 MG PO TB12
150.0000 mg | ORAL_TABLET | Freq: Two times a day (BID) | ORAL | 1 refills | Status: DC
  Filled 2024-06-09: qty 180, 90d supply, fill #0
  Filled 2024-09-07: qty 180, 90d supply, fill #1

## 2024-06-10 ENCOUNTER — Other Ambulatory Visit: Payer: Self-pay

## 2024-06-15 ENCOUNTER — Encounter: Payer: Self-pay | Admitting: *Deleted

## 2024-06-16 ENCOUNTER — Encounter: Payer: Self-pay | Admitting: Obstetrics and Gynecology

## 2024-06-16 ENCOUNTER — Ambulatory Visit (INDEPENDENT_AMBULATORY_CARE_PROVIDER_SITE_OTHER): Admitting: Obstetrics and Gynecology

## 2024-06-16 ENCOUNTER — Other Ambulatory Visit (HOSPITAL_COMMUNITY): Payer: Self-pay

## 2024-06-16 VITALS — BP 122/74 | HR 101 | Ht 64.5 in | Wt 285.0 lb

## 2024-06-16 DIAGNOSIS — Z01818 Encounter for other preprocedural examination: Secondary | ICD-10-CM

## 2024-06-16 DIAGNOSIS — N921 Excessive and frequent menstruation with irregular cycle: Secondary | ICD-10-CM | POA: Diagnosis not present

## 2024-06-16 DIAGNOSIS — R102 Pelvic and perineal pain: Secondary | ICD-10-CM | POA: Diagnosis not present

## 2024-06-16 DIAGNOSIS — Z1231 Encounter for screening mammogram for malignant neoplasm of breast: Secondary | ICD-10-CM

## 2024-06-16 MED ORDER — METOCLOPRAMIDE HCL 10 MG PO TABS
10.0000 mg | ORAL_TABLET | Freq: Three times a day (TID) | ORAL | 0 refills | Status: DC | PRN
Start: 2024-06-16 — End: 2024-08-03
  Filled 2024-06-16: qty 5, 2d supply, fill #0

## 2024-06-16 MED ORDER — ACETAMINOPHEN 500 MG PO TABS
500.0000 mg | ORAL_TABLET | Freq: Four times a day (QID) | ORAL | 0 refills | Status: DC | PRN
  Filled 2024-06-16: qty 30, 8d supply, fill #0

## 2024-06-16 MED ORDER — OXYCODONE HCL 5 MG PO TABS
5.0000 mg | ORAL_TABLET | ORAL | 0 refills | Status: DC | PRN
  Filled 2024-06-16: qty 8, 2d supply, fill #0

## 2024-06-16 NOTE — Progress Notes (Unsigned)
 Tanya Dorsey 1986-05-14 [id number]  PREOP for RLH, bilateral salpingectomy, possible ovarian cystectomy, removal of IUD, cystoscopy  History:  38 y.o. G 1p0101 presents for EMB, pap smear prior to having the [facility] Patient with heavy painful periods. Husband with vasectomy. Has accepted non binary process and having periods that are all over are bothersome with testosterone . Tried ocp's with the paragard and couldn't take them with her anxiety and CHTN.  Reports her blood pressure elevates with any estrogen ocp's She does not want any more hormones and would like a definitive management with the [facility]. She is frustrated with the pain and bleeding and would like a better quality of life as well. Medicaid papers reviewed and consent done,. Counseled that the hysterectomy is a permanent procedure and she cannot conceive in the future.  She is in her right mind and voices understanding.  She had a horrible pregnancy with LTCs with cholestasis of pregnancy, post partum preeclampsia and GDM.  She does nto want to be pregnant  Gynecologic History No LMP recorded. (Menstrual status: IUD).     Health Maintenance  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   Heart murmur    Hypertension    Prediabetes     OB History     Gravida  1   Para  1   Term  1   Preterm  0   AB  0   Living  1      SAB  0   IAB  0   Ectopic  0   Multiple  0   Live Births  1           Past Surgical History:  Procedure Laterality Date   CESAREAN SECTION N/A 08/02/2014   Procedure: CESAREAN SECTION;  Surgeon: Nena DELENA App, MD;  Location: [facility];  Service: Obstetrics;  Laterality: N/A;   CHOLECYSTECTOMY     TONSILLECTOMY     WISDOM TOOTH EXTRACTION     Social History   Socioeconomic  History   Marital status: Married    Spouse name: Not on file   Number of children: 1   Years of education: Not on file   Highest education level: Not on file  Occupational History   Occupation: therapist  Tobacco Use   Smoking status: Never    Passive exposure: Never   Smokeless tobacco: Never  Vaping Use   Vaping status: Never Used  Substance and Sexual Activity   Alcohol use: No   Drug use: No   Sexual activity: Yes    Partners: Male    Birth control/protection: I.U.D.    Comment: Paragard inserted 2022, menarche 38yo, sexual debut 38yo  Other Topics Concern   Not on file  Social History Narrative   Mental Health counselor   Married   1 child -- 66 yo girl   Social Drivers of Corporate investment banker Strain: Not on file  Food Insecurity: Not on file  Transportation Needs: Not on file  Physical Activity: Not on file  Stress: Not on file  Social Connections: Not on file    ROS:  A ROS was performed and pertinent positives and negatives are included.  Exam: S1S2 CTA B/L'  Soft, NT No c/c/e  Current Outpatient Medications on File Prior to Visit  Medication Sig Dispense Refill   amphetamine -dextroamphetamine  (ADDERALL XR) 25 MG 24 hr capsule Take 1 capsule by mouth every morning. 30 capsule 0   amphetamine -dextroamphetamine  (ADDERALL XR) 25 MG 24 hr capsule Take 1 capsule by mouth every morning. (Patient not taking: Reported on 06/16/2024) 30 capsule 0   [START ON 07/17/2024] amphetamine -dextroamphetamine  (ADDERALL XR) 25 MG 24 hr capsule Take 1 capsule by mouth every morning. (Patient not taking: Reported on 06/16/2024) 30 capsule 0   buPROPion  (WELLBUTRIN  SR) 150 MG 12 hr tablet Take 1 tablet (150 mg total) by mouth 2 (two) times daily. 180 tablet 1   cholecalciferol (VITAMIN D3) 25 MCG (1000 UNIT) tablet Take 1,000 Units by mouth daily.     Continuous Glucose Sensor (FREESTYLE LIBRE 3 PLUS SENSOR) MISC Change sensor every 15 days. 2 each 2   cyanocobalamin   (VITAMIN B12) 1000 MCG/ML injection Inject 1 ml into the muscle for weekly for 3 weeks then monthly 10 mL 0   LORazepam  (ATIVAN ) 0.5 MG tablet Take 1 tablet (0.5 mg total) by mouth daily as needed. 20 tablet 0   Olopatadine  HCl 0.2 % SOLN Apply 1 drop to eye 2 (two) times daily. 2.5 mL 0   PARAGARD INTRAUTERINE COPPER IU by Intrauterine route. Inserted 2 yrs ago at [facility]. (2022)     tirzepatide  (MOUNJARO ) 7.5 MG/0.5ML Pen Inject 7.5 mg into the skin once a week. 2 mL 1   Vilazodone  HCl 20 MG TABS Take 1 tablet (20 mg total) by mouth daily. 30 tablet 1   vitamin B-12 (CYANOCOBALAMIN ) 100 MCG tablet Take 1 tablet (100 mcg total) by mouth daily. 90 tablet 3   DULoxetine  (CYMBALTA ) 20 MG capsule Take 1 capsule (20 mg total) by mouth 2 (two) times daily. (Patient not taking: Reported on 06/16/2024) 90 capsule 0   No current facility-administered medications on file prior to visit.    There were no vitals filed for this visit.  There is no height or weight on file to calculate BMI.  OBGyn Exam PUS 10.42cm uterus 7.14cm right ovarian simple cyst LO not seen Normal EML No free fluid   Assessment/Plan:  38 y.o.  for DUB, menorrhagia, dysmenorrhea with current paragard IUD desires definitive management with the [facility] and is here today for preop  PUS with right ovarian cyst.  (Ca125 normal at 11 05/10/24) Likely adenomyosis EMB collected today H/o cesarean section IUD in place   The risks of surgery were discussed in detail with the patient including but not limited to: bleeding which may require transfusion or reoperation; infection which may require prolonged hospitalization or re-hospitalization and antibiotic therapy; injury to bowel, bladder, ureters and major vessels or other surrounding organs which may lead to other procedures; formation of adhesions; need for additional procedures including laparotomy or subsequent procedures secondary to intraoperative injury or abnormal  pathology; thromboembolic phenomenon; incisional problems and other postoperative or anesthesia complications.  The postoperative expectations were also discussed in detail. The patient also understands the alternative treatment options which were discussed in full. All questions were answered.  Patient would like to proceed with the procedure.  30 minutes spent on reviewing records, imaging,  and one on one patient time and counseling patient and documentation Dr. Glennon Almarie MARLA Glennon, MD

## 2024-06-16 NOTE — Patient Instructions (Signed)
 Robotic Laparoscopic Hysterectomy, Care After  The following information offers guidance on how to care for yourself after your procedure. Your health care provider may also give you more specific instructions. If you have problems or questions, contact your health care provider. What can I expect after the procedure? After the procedure, it is common to have: Pain, bruising, and numbness around your incisions. Tiredness (fatigue). Poor appetite. Less interest in sex. Vaginal discharge or bleeding. You will need to use a sanitary pad after this procedure.  HEAVY BLEEDING LIKE A PERIOD IS NOT NORMAL.  PLEASE CALL YOUR PROVIDER IF SOAKING A PAD. Feelings of sadness or other emotions. If your ovaries were also removed, it is also common to have symptoms of menopause, such as hot flashes, night sweats, and lack of sleep (insomnia).  Ovaries should stay in if at all possible until at least the age of 30. Follow these instructions at home: Medicines Take over-the-counter and prescription medicines only as told by your health care provider. Ask your health care provider if the medicine prescribed to you: Requires you to avoid driving or using machinery. Can cause constipation. You may need to take these actions to prevent or treat constipation: Drink enough fluid to keep your urine pale yellow. Take over-the-counter or prescription medicines. Eat foods that are high in fiber, such as beans, whole grains, and fresh fruits and vegetables. Limit foods that are high in fat and processed sugars, such as fried or sweet foods.  Also, avoid spicy foods.  NAUSEA IS COMMON THE FIRST NIGHT OF SURGERY.  IF IT LASTS BEYOND 24 HOURS, CALL YOUR PROVIDER.  NAUSEA MEDICATION WAS GIVEN AT YOUR PREOP APPOINTMENT THAT YOU CAN TAKE AFTER SURGERY. Incision care  Follow instructions from your health care provider about how to take care of your incisions. Make sure you: LEAVE INCISION OPEN AND DRY-NO BANDAGES Leave  stitches (sutures), skin glue, or adhesive strips in place UNTIL 2 WEEKS THEN REMOVE IN THE SHOWER.  If adhesive strip edges start to loosen and curl up, you may trim the loose edges. Check your incision areas every day for signs of infection. Check for: More redness, swelling, or pain. Fluid or blood. Warmth. Pus or a bad smell. Activity  Rest as told by your health care provider. Avoid sitting for a long time without moving. Get up to take short walks every 1-2 hours. This is important to improve blood flow and breathing. Ask for help if you feel weak or unsteady. Return to your normal activities as told by your health care provider. Ask your health care provider what activities are safe for you. Do not lift anything that is heavier than 10 lb (4.5 kg), or the limit that you are told, for 8 WEEKS after surgery or until your health care provider says that it is safe. If you were given a sedative during the procedure, it can affect you for several hours. Do not drive or operate machinery until your health care provider says that it is safe. Lifestyle Do not use any products that contain nicotine or tobacco. These products include cigarettes, chewing tobacco, and vaping devices, such as e-cigarettes. These can delay healing after surgery. If you need help quitting, ask your health care provider. Do not drink alcohol until your health care provider approves. General instructions FOR 2 WEEKS AFTER SURGERY, THEN YOU MAY USE TUBS AND HOT TUBS Do not douche, use tampons, or have sex for at least 10 weeks, or as told by your health care  provider. If you struggle with physical or emotional changes after your procedure, speak with your health care provider or a therapist. Do not take baths, swim, or use a hot tub until your health care provider approves. You may only be allowed to take showers for 2 weeks. IF YOU HAVE BURNING WITH URINATION, PLEASE CALL YOUR DOCTOR. BLADDER INFECTIONS MAY OCCUR AFTER  SURGERY Try to have someone at home with you for the first 1-2 weeks to help with your daily chores. Wear compression stockings as told by your health care provider. These stockings help to prevent blood clots and reduce swelling in your legs. Keep all follow-up visits. This is important. Contact a health care provider if: You have any of these signs of infection: Chills or a fever 168f OR GREATER. More redness, swelling, or pain around an incision. Fluid or blood coming from an incision. Warmth coming from an incision. Pus or a bad smell coming from an incision. Burning with urination. Urinary frequency or cramping.   IF YOU HAVE THESE SYMPTOMS, PLEASE CALL THE OFFICE TO COME EVALUATE FOR A BLADDER INFECTION AT [phone] An incision opens. You feel dizzy or light-headed. You have pain or bleeding when you urinate, or you are unable to urinate. You have abnormal vaginal discharge. You have pain that does not get better with medicine. Get help right away if: You have a fever and your symptoms suddenly get worse. You have severe abdominal pain. You have chest pain or shortness of breath. You may have chest pain and shortness of breath from the CO2 gas for a few days after surgery.  This is very common.  Walking, Gas-X and motrin  will usually help relieve this discomfort You faint. You have pain, swelling, or redness in your leg. You have heavy vaginal bleeding with blood clots, soaking through a sanitary pad in less than 1 hour. These symptoms may represent a serious problem that is an emergency. Do not wait to see if the symptoms will go away. Get medical help right away. Call your local emergency services (911 in the [place] Do not drive yourself to the hospital. Summary  CONSTIPATION MEDICATION AFTER SURGERY: COLACE, MOM, MIRALAX, GAS X are all helpful to have on hand, if needed.  FILL ALL POSTOP MEDICATION BEFORE SURGERY  After the procedure, it is common to have pain and bruising  around your incisions. Do not take baths, swim, or use a hot tub until your health care provider approves. Do not lift anything that is heavier than 13 lb (4.5 kg), or the limit that you are told, for one month after surgery or until your health care provider says that it is safe. Tell your health care provider if you have any signs or symptoms of infection after the procedure. Get help right away if you have severe abdominal pain, chest pain, shortness of breath, or heavy bleeding from your vagina. This information is not intended to replace advice given to you by your  health care provider. Make sure you discuss any questions you have with your health care provider. Document Revised: 07/12/2020 Document Reviewed: 07/13/2020 Elsevier Patient Education  2024 [employer].

## 2024-06-16 NOTE — H&P (View-Only) (Signed)
 Tanya Dorsey February 24, 1986 [id number]  PREOP for RLH, bilateral salpingectomy, possible ovarian cystectomy, removal of IUD, cystoscopy  History:  38 y.o. G 1p0101 presents for EMB, pap smear prior to having the [facility] Patient with heavy painful periods. Husband with vasectomy. Has accepted non binary process and having periods that are all over are bothersome with testosterone . Tried ocp's with the paragard and couldn't take them with her anxiety and CHTN.  Reports her blood pressure elevates with any estrogen ocp's She does not want any more hormones and would like a definitive management with the [facility]. She is frustrated with the pain and bleeding and would like a better quality of life as well. Medicaid papers reviewed and consent done,. Counseled that the hysterectomy is a permanent procedure and she cannot conceive in the future.  She is in her right mind and voices understanding.  She had a horrible pregnancy with LTCs with cholestasis of pregnancy, post partum preeclampsia and GDM.  She does nto want to be pregnant  Gynecologic History No LMP recorded. (Menstrual status: IUD).     Health Maintenance  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   Heart murmur    Hypertension    Prediabetes     OB History     Gravida  1   Para  1   Term  1   Preterm  0   AB  0   Living  1      SAB  0   IAB  0   Ectopic  0   Multiple  0   Live Births  1           Past Surgical History:  Procedure Laterality Date   CESAREAN SECTION N/A 08/02/2014   Procedure: CESAREAN SECTION;  Surgeon: Nena DELENA App, MD;  Location: [facility];  Service: Obstetrics;  Laterality: N/A;   CHOLECYSTECTOMY     TONSILLECTOMY     WISDOM TOOTH EXTRACTION     Social History   Socioeconomic  History   Marital status: Married    Spouse name: Not on file   Number of children: 1   Years of education: Not on file   Highest education level: Not on file  Occupational History   Occupation: therapist  Tobacco Use   Smoking status: Never    Passive exposure: Never   Smokeless tobacco: Never  Vaping Use   Vaping status: Never Used  Substance and Sexual Activity   Alcohol use: No   Drug use: No   Sexual activity: Yes    Partners: Male    Birth control/protection: I.U.D.    Comment: Paragard inserted 2022, menarche 38yo, sexual debut 38yo  Other Topics Concern   Not on file  Social History Narrative   Mental Health counselor   Married   1 child -- 74 yo girl   Social Drivers of Corporate investment banker Strain: Not on file  Food Insecurity: Not on file  Transportation Needs: Not on file  Physical Activity: Not on file  Stress: Not on file  Social Connections: Not on file    ROS:  A ROS was performed and pertinent positives and negatives are included.  Exam: S1S2 CTA B/L'  Soft, NT No c/c/e  Current Outpatient Medications on File Prior to Visit  Medication Sig Dispense Refill   amphetamine -dextroamphetamine  (ADDERALL XR) 25 MG 24 hr capsule Take 1 capsule by mouth every morning. 30 capsule 0   amphetamine -dextroamphetamine  (ADDERALL XR) 25 MG 24 hr capsule Take 1 capsule by mouth every morning. (Patient not taking: Reported on 06/16/2024) 30 capsule 0   [START ON 07/17/2024] amphetamine -dextroamphetamine  (ADDERALL XR) 25 MG 24 hr capsule Take 1 capsule by mouth every morning. (Patient not taking: Reported on 06/16/2024) 30 capsule 0   buPROPion  (WELLBUTRIN  SR) 150 MG 12 hr tablet Take 1 tablet (150 mg total) by mouth 2 (two) times daily. 180 tablet 1   cholecalciferol (VITAMIN D3) 25 MCG (1000 UNIT) tablet Take 1,000 Units by mouth daily.     Continuous Glucose Sensor (FREESTYLE LIBRE 3 PLUS SENSOR) MISC Change sensor every 15 days. 2 each 2   cyanocobalamin   (VITAMIN B12) 1000 MCG/ML injection Inject 1 ml into the muscle for weekly for 3 weeks then monthly 10 mL 0   LORazepam  (ATIVAN ) 0.5 MG tablet Take 1 tablet (0.5 mg total) by mouth daily as needed. 20 tablet 0   Olopatadine  HCl 0.2 % SOLN Apply 1 drop to eye 2 (two) times daily. 2.5 mL 0   PARAGARD INTRAUTERINE COPPER IU by Intrauterine route. Inserted 2 yrs ago at [facility]. (2022)     tirzepatide  (MOUNJARO ) 7.5 MG/0.5ML Pen Inject 7.5 mg into the skin once a week. 2 mL 1   Vilazodone  HCl 20 MG TABS Take 1 tablet (20 mg total) by mouth daily. 30 tablet 1   vitamin B-12 (CYANOCOBALAMIN ) 100 MCG tablet Take 1 tablet (100 mcg total) by mouth daily. 90 tablet 3   DULoxetine  (CYMBALTA ) 20 MG capsule Take 1 capsule (20 mg total) by mouth 2 (two) times daily. (Patient not taking: Reported on 06/16/2024) 90 capsule 0   No current facility-administered medications on file prior to visit.    There were no vitals filed for this visit.  There is no height or weight on file to calculate BMI.  OBGyn Exam PUS 10.42cm uterus 7.14cm right ovarian simple cyst LO not seen Normal EML No free fluid   Assessment/Plan:  38 y.o.  for DUB, menorrhagia, dysmenorrhea with current paragard IUD desires definitive management with the [facility] and is here today for preop  PUS with right ovarian cyst.  (Ca125 normal at 11 05/10/24) Likely adenomyosis EMB collected today H/o cesarean section IUD in place   The risks of surgery were discussed in detail with the patient including but not limited to: bleeding which may require transfusion or reoperation; infection which may require prolonged hospitalization or re-hospitalization and antibiotic therapy; injury to bowel, bladder, ureters and major vessels or other surrounding organs which may lead to other procedures; formation of adhesions; need for additional procedures including laparotomy or subsequent procedures secondary to intraoperative injury or abnormal  pathology; thromboembolic phenomenon; incisional problems and other postoperative or anesthesia complications.  The postoperative expectations were also discussed in detail. The patient also understands the alternative treatment options which were discussed in full. All questions were answered.  Patient would like to proceed with the procedure.  30 minutes spent on reviewing records, imaging,  and one on one patient time and counseling patient and documentation Dr. Glennon Almarie MARLA Glennon, MD

## 2024-06-17 ENCOUNTER — Other Ambulatory Visit (HOSPITAL_COMMUNITY): Payer: Self-pay

## 2024-06-20 ENCOUNTER — Encounter (HOSPITAL_COMMUNITY): Payer: Self-pay | Admitting: Obstetrics and Gynecology

## 2024-06-20 NOTE — Progress Notes (Signed)
 Spoke w/ via phone for pre-op interview--- pt Lab needs dos----  upt       Lab results------ lab appt 06-21-2024 @ 1130 getting CBC/ BMP/ T&S/ EKG COVID test -----patient states asymptomatic no test needed Arrive at ------- 0700 on 06-24-2024 NPO after MN w/ exception sips of water w/ meds Pre-Surgery Ensure or G2: n/a  Med rec completed Medications to take morning of surgery ----- wellbutrin , viibyrd Diabetic medication ----- no oral meds  GLP1 agonist last dose: 06-12-2024 GLP1 instructions: pt stated was instructions by dr boswell not to do again until after surgery  Patient instructed no nail polish to be worn day of surgery Patient instructed to bring photo id and insurance card day of surgery Patient aware to have Driver (ride ) / caregiver    for 24 hours after surgery - husband, royall Delmundo Patient Special Instructions ----- will pick up bag w/ soap and written instructions at lab appt Pre-Op special Instructions ----- pt has libre 3 plus on left arm  Patient verbalized understanding of instructions that were given at this phone interview. Patient denies chest pain, sob, fever, cough at the interview.

## 2024-06-20 NOTE — Pre-Procedure Instructions (Addendum)
 Surgical Instructions   Your procedure is scheduled on :  Friday,  06-24-2024. Report to [facility] Main Entrance A at 7:00  A.M., then check in with the Admitting office. Any questions or running late day of surgery: call [phone]  Questions prior to your surgery date: call [phone], Monday-Friday, 8am-4pm. If you experience any cold or flu symptoms such as cough, fever, chills, shortness of breath, etc. between now and your scheduled surgery, please notify your surgeon office.    Remember:  Do not eat andy food and do not drink any liquid after midnight the night before your surgery.  This includes no water,  candy,  gum,  and  mints.    Take these medicines the morning of surgery with A SIPS OF WATER : Bupropion  (wellbutrin ) Vilazodone  (viibryd )   May take these medicines IF NEEDED: Lorazepam  (ativan ) Olopatadine  eye drops   One week prior to surgery, STOP taking any Aspirin (unless otherwise instructed by your surgeon) Aleve, Naproxen, Ibuprofen , Motrin , Advil , Goody's, BC's, all herbal medications, fish oil, and non-prescription vitamins.                     Do NOT Smoke (Tobacco/Vaping) and Do Not drink alcohol for 24 hours prior to your procedure.  If you use a CPAP at night, you may bring your mask/headgear for your overnight stay.   You will be asked to remove any contacts, glasses, piercing's, hearing aid's, dentures/partials prior to surgery. Please bring cases/ contacts & solutions for these items if needed.    Patients discharged the day of surgery will not be allowed to drive home, and someone needs to stay with them for 24 hours.  SURGICAL WAITING ROOM VISITATION Patients may have no more than 2 support people in the waiting area - these visitors may rotate.   Pre-op nurse will coordinate an appropriate time for 1 ADULT support person, who may not rotate, to accompany patient in pre-op.  Children under the age of 50 must have an adult with them who is  not the patient and must remain in the main waiting area with an adult.  If the patient needs to stay at the hospital during part of their recovery, the visitor guidelines for inpatient rooms apply.  Please refer to the [facility] website for the visitor guidelines for any additional information.   If you received a COVID test during your pre-op visit  it is requested that you wear a mask when out in public, stay away from anyone that may not be feeling well and notify your surgeon if you develop symptoms. If you have been in contact with anyone that has tested positive in the last 10 days please notify you surgeon.      Pre-operative CHG Bathing Instructions   You can play a key role in reducing the risk of infection after surgery. Your skin needs to be as free of germs as possible. You can reduce the number of germs on your skin by washing with CHG (chlorhexidine gluconate) soap before surgery. CHG is an antiseptic soap that kills germs and continues to kill germs even after washing.   DO NOT use if you have an allergy to chlorhexidine/CHG or antibacterial soaps. If your skin becomes reddened or irritated, stop using the CHG and notify Pre-op nurse day of surgery.             TAKE A SHOWER THE NIGHT BEFORE SURGERY AND THE DAY OF SURGERY    Please  keep in mind the following:  DO NOT shave, including legs and underarms, 48 hours prior to surgery.   You may shave your face before/day of surgery.  Place clean sheets on your bed the night before surgery Use a clean washcloth (not used since being washed) for each shower. DO NOT sleep with pet's night before surgery.  CHG Shower Instructions:  Wash your face and private area with normal soap. If you choose to wash your hair, wash first with your normal shampoo.  After you use shampoo/soap, rinse your hair and body thoroughly to remove shampoo/soap residue.  Turn the water OFF and apply half the bottle of CHG soap to a CLEAN washcloth.   Apply CHG soap ONLY FROM YOUR NECK DOWN TO YOUR TOES (washing for 3-5 minutes)  DO NOT use CHG soap on face, private areas, open wounds, or sores.  Pay special attention to the area where your surgery is being performed.  If you are having back surgery, having someone wash your back for you may be helpful. Wait 2 minutes after CHG soap is applied, then you may rinse off the CHG soap.  Pat dry with a clean towel  Put on clean pajamas    Additional instructions for the day of surgery: DO NOT APPLY any lotions,  powder,  oils,  deodorants (may use underarm deodorant) , cologne/  perfumes or makeup Do not wear jewelry / piercing's/ metal/ permanent jewelry must be removed prior to arrival day of surgery.  (No plastic piercing) Do not wear nail polish, gel polish, artificial nails, or any other type of covering on natural finger nails (toe nails are okay Do not bring valuables to the hospital. [facility] is not responsible for valuables/personal belongings. Put on clean/comfortable clothes.  Please brush your teeth.  Ask your nurse before applying any prescription medications to the skin.    How to Manage Your Diabetes Before and After Surgery  Why is it important to control my blood sugar before and after surgery? Improving blood sugar levels before and after surgery helps healing and can limit problems. A way of improving blood sugar control is eating a healthy diet by:  Eating less sugar and carbohydrates  Increasing activity/exercise  Talking with your doctor about reaching your blood sugar goals High blood sugars (greater than 180 mg/dL) can raise your risk of infections and slow your recovery, so you will need to focus on controlling your diabetes during the weeks before surgery. Make sure that the doctor who takes care of your diabetes knows about your planned surgery including the date and location.  How do I manage my blood sugar before surgery? Check your blood sugar at least  4 times a day, starting 2 days before surgery, to make sure that the level is not too high or low. Check your blood sugar the morning of your surgery when you wake up morning of surgery If your blood sugar is less than 70 mg/dL, you will need to treat for low blood sugar: Treat a low blood sugar (less than 70 mg/dL) with  cup of clear juice (cranberry or apple). Recheck blood sugar in 15 minutes after treatment (to make sure it is greater than 70 mg/dL). If your blood sugar is not greater than 70 mg/dL on recheck, call [phone] for further instructions. Report your blood sugar to the Pre-op nurse when you get to Pre-op   If you are admitted to the hospital after surgery: Your blood sugar will be checked by  the staff and you will probably be given insulin after surgery (instead of oral diabetes medicines) to make sure you have good blood sugar levels. The goal for blood sugar control after surgery is 80-180 mg/dL.

## 2024-06-21 ENCOUNTER — Ambulatory Visit: Payer: Self-pay | Admitting: Obstetrics and Gynecology

## 2024-06-21 ENCOUNTER — Encounter (HOSPITAL_COMMUNITY)
Admission: RE | Admit: 2024-06-21 | Discharge: 2024-06-21 | Disposition: A | Discharge status: Home / Self Care | Source: Ambulatory Visit | Attending: Obstetrics and Gynecology | Admitting: Obstetrics and Gynecology

## 2024-06-21 DIAGNOSIS — Z01818 Encounter for other preprocedural examination: Secondary | ICD-10-CM | POA: Diagnosis not present

## 2024-06-21 DIAGNOSIS — R9431 Abnormal electrocardiogram [ECG] [EKG]: Secondary | ICD-10-CM | POA: Insufficient documentation

## 2024-06-21 LAB — CBC
HCT: 42.5 % (ref 36.0–46.0)
Hemoglobin: 13.8 g/dL (ref 12.0–15.0)
MCH: 27.9 pg (ref 26.0–34.0)
MCHC: 32.5 g/dL (ref 30.0–36.0)
MCV: 86 fL (ref 80.0–100.0)
Platelets: 384 K/uL (ref 150–400)
RBC: 4.94 MIL/uL (ref 3.87–5.11)
RDW: 13.3 % (ref 11.5–15.5)
WBC: 11.5 K/uL — ABNORMAL HIGH (ref 4.0–10.5)
nRBC: 0 % (ref 0.0–0.2)

## 2024-06-21 LAB — TYPE AND SCREEN
ABO/RH(D): O NEG
Antibody Screen: NEGATIVE

## 2024-06-21 LAB — BASIC METABOLIC PANEL WITH GFR
Anion gap: 11 (ref 5–15)
BUN: 11 mg/dL (ref 6–20)
CO2: 22 mmol/L (ref 22–32)
Calcium: 9 mg/dL (ref 8.9–10.3)
Chloride: 103 mmol/L (ref 98–111)
Creatinine, Ser: 0.87 mg/dL (ref 0.44–1.00)
GFR, Estimated: >60 mL/min (ref >60)
Glucose, Bld: 107 mg/dL — ABNORMAL HIGH (ref 70–99)
Potassium: 4.5 mmol/L (ref 3.5–5.1)
Sodium: 136 mmol/L (ref 135–145)

## 2024-06-23 ENCOUNTER — Other Ambulatory Visit (HOSPITAL_COMMUNITY): Payer: Self-pay

## 2024-06-23 ENCOUNTER — Telehealth (HOSPITAL_COMMUNITY): Payer: Self-pay

## 2024-06-23 MED ORDER — SODIUM CHLORIDE 0.9 % IV SOLN
Freq: Once | INTRAVENOUS | Status: DC
  Filled 2024-06-23: qty 10

## 2024-06-23 NOTE — Telephone Encounter (Signed)
 Pharmacy Patient Advocate Encounter   Received notification from Patient Pharmacy that prior authorization for FreeStyle Libre 3 Plus Sensor  is required/requested.   Insurance verification completed.   The patient is insured through [facility] .   Per test claim: PA required; PA submitted to above mentioned insurance via CoverMyMeds Key/confirmation #/EOC [id number] Status is pending

## 2024-06-24 ENCOUNTER — Encounter (HOSPITAL_COMMUNITY)
Admission: RE | Disposition: A | Discharge status: Home / Self Care | Payer: Self-pay | Source: Home / Self Care | Attending: Obstetrics and Gynecology

## 2024-06-24 ENCOUNTER — Other Ambulatory Visit (HOSPITAL_COMMUNITY): Payer: Self-pay

## 2024-06-24 ENCOUNTER — Other Ambulatory Visit: Payer: Self-pay

## 2024-06-24 ENCOUNTER — Telehealth: Payer: Self-pay | Admitting: Obstetrics

## 2024-06-24 ENCOUNTER — Ambulatory Visit (HOSPITAL_COMMUNITY)

## 2024-06-24 ENCOUNTER — Ambulatory Visit (HOSPITAL_COMMUNITY)
Admission: RE | Admit: 2024-06-24 | Discharge: 2024-06-24 | Disposition: A | Discharge status: Home / Self Care | Attending: Obstetrics and Gynecology | Admitting: Obstetrics and Gynecology

## 2024-06-24 ENCOUNTER — Encounter (HOSPITAL_COMMUNITY): Payer: Self-pay | Admitting: Obstetrics and Gynecology

## 2024-06-24 DIAGNOSIS — F419 Anxiety disorder, unspecified: Secondary | ICD-10-CM | POA: Diagnosis not present

## 2024-06-24 DIAGNOSIS — E119 Type 2 diabetes mellitus without complications: Secondary | ICD-10-CM | POA: Insufficient documentation

## 2024-06-24 DIAGNOSIS — N83209 Unspecified ovarian cyst, unspecified side: Secondary | ICD-10-CM

## 2024-06-24 DIAGNOSIS — D649 Anemia, unspecified: Secondary | ICD-10-CM | POA: Diagnosis not present

## 2024-06-24 DIAGNOSIS — R102 Pelvic and perineal pain: Secondary | ICD-10-CM | POA: Insufficient documentation

## 2024-06-24 DIAGNOSIS — Z79899 Other long term (current) drug therapy: Secondary | ICD-10-CM | POA: Diagnosis not present

## 2024-06-24 DIAGNOSIS — D259 Leiomyoma of uterus, unspecified: Secondary | ICD-10-CM | POA: Diagnosis not present

## 2024-06-24 DIAGNOSIS — N938 Other specified abnormal uterine and vaginal bleeding: Secondary | ICD-10-CM | POA: Diagnosis present

## 2024-06-24 DIAGNOSIS — E66813 Obesity, class 3: Secondary | ICD-10-CM | POA: Insufficient documentation

## 2024-06-24 DIAGNOSIS — D270 Benign neoplasm of right ovary: Secondary | ICD-10-CM | POA: Insufficient documentation

## 2024-06-24 DIAGNOSIS — N921 Excessive and frequent menstruation with irregular cycle: Secondary | ICD-10-CM

## 2024-06-24 DIAGNOSIS — N920 Excessive and frequent menstruation with regular cycle: Secondary | ICD-10-CM | POA: Insufficient documentation

## 2024-06-24 DIAGNOSIS — K660 Peritoneal adhesions (postprocedural) (postinfection): Secondary | ICD-10-CM | POA: Diagnosis not present

## 2024-06-24 DIAGNOSIS — F909 Attention-deficit hyperactivity disorder, unspecified type: Secondary | ICD-10-CM | POA: Diagnosis not present

## 2024-06-24 DIAGNOSIS — M1990 Unspecified osteoarthritis, unspecified site: Secondary | ICD-10-CM | POA: Insufficient documentation

## 2024-06-24 DIAGNOSIS — N838 Other noninflammatory disorders of ovary, fallopian tube and broad ligament: Secondary | ICD-10-CM | POA: Diagnosis not present

## 2024-06-24 DIAGNOSIS — Z01818 Encounter for other preprocedural examination: Secondary | ICD-10-CM

## 2024-06-24 DIAGNOSIS — Z6841 Body Mass Index (BMI) 40.0 and over, adult: Secondary | ICD-10-CM | POA: Insufficient documentation

## 2024-06-24 DIAGNOSIS — Z98891 History of uterine scar from previous surgery: Secondary | ICD-10-CM | POA: Insufficient documentation

## 2024-06-24 DIAGNOSIS — F32A Depression, unspecified: Secondary | ICD-10-CM | POA: Diagnosis not present

## 2024-06-24 DIAGNOSIS — N83201 Unspecified ovarian cyst, right side: Secondary | ICD-10-CM | POA: Diagnosis not present

## 2024-06-24 DIAGNOSIS — F418 Other specified anxiety disorders: Secondary | ICD-10-CM | POA: Diagnosis not present

## 2024-06-24 DIAGNOSIS — N946 Dysmenorrhea, unspecified: Secondary | ICD-10-CM | POA: Diagnosis present

## 2024-06-24 HISTORY — DX: Elevated blood-pressure reading, without diagnosis of hypertension: R03.0

## 2024-06-24 HISTORY — DX: Type 2 diabetes mellitus without complications: E11.9

## 2024-06-24 HISTORY — DX: Deficiency of other specified B group vitamins: E53.8

## 2024-06-24 HISTORY — PX: ROBOTIC ASSISTED LAPAROSCOPIC LYSIS OF ADHESION: SHX6080

## 2024-06-24 HISTORY — DX: Anemia, unspecified: D64.9

## 2024-06-24 HISTORY — PX: HYSTERECTOMY, TOTAL, LAPAROSCOPIC, ROBOT-ASSISTED WITH SALPINGECTOMY: SHX7587

## 2024-06-24 HISTORY — DX: Unspecified eustachian tube disorder, bilateral: H69.93

## 2024-06-24 HISTORY — DX: Presence of external hearing-aid: Z97.4

## 2024-06-24 HISTORY — DX: Vitamin D deficiency, unspecified: E55.9

## 2024-06-24 HISTORY — DX: Excessive and frequent menstruation with regular cycle: N92.0

## 2024-06-24 HISTORY — DX: Presence of spectacles and contact lenses: Z97.3

## 2024-06-24 HISTORY — PX: ROBOTIC ASSISTED LAPAROSCOPIC OVARIAN CYSTECTOMY: SHX6081

## 2024-06-24 HISTORY — PX: OVARIAN CYST REMOVAL: SHX89

## 2024-06-24 HISTORY — PX: CYSTOSCOPY: SHX5120

## 2024-06-24 HISTORY — DX: Dysmenorrhea, unspecified: N94.6

## 2024-06-24 LAB — POCT PREGNANCY, URINE: Preg Test, Ur: NEGATIVE

## 2024-06-24 LAB — GLUCOSE, CAPILLARY
Glucose-Capillary: 125 mg/dL — ABNORMAL HIGH (ref 70–99)
Glucose-Capillary: 130 mg/dL — ABNORMAL HIGH (ref 70–99)

## 2024-06-24 SURGERY — HYSTERECTOMY, TOTAL, LAPAROSCOPIC, ROBOT-ASSISTED WITH SALPINGECTOMY
Anesthesia: General | Site: Bladder | Laterality: Right

## 2024-06-24 MED ORDER — SODIUM CHLORIDE 0.9 % IV SOLN
2.0000 g | INTRAVENOUS | Status: AC
  Administered 2024-06-24: 2 g via INTRAVENOUS
  Filled 2024-06-24: qty 2

## 2024-06-24 MED ORDER — LIDOCAINE 2% (20 MG/ML) 5 ML SYRINGE
INTRAMUSCULAR | Status: AC
  Filled 2024-06-24: qty 5

## 2024-06-24 MED ORDER — ACETAMINOPHEN 10 MG/ML IV SOLN: 1000.0000 mg | Freq: Once | INTRAVENOUS | Status: DC | PRN

## 2024-06-24 MED ORDER — ACETAMINOPHEN 500 MG PO TABS
ORAL_TABLET | ORAL | Status: AC
  Filled 2024-06-24: qty 2

## 2024-06-24 MED ORDER — ROCURONIUM BROMIDE 10 MG/ML (PF) SYRINGE
PREFILLED_SYRINGE | INTRAVENOUS | Status: DC | PRN
  Administered 2024-06-24: 20 mg via INTRAVENOUS
  Administered 2024-06-24: 70 mg via INTRAVENOUS
  Administered 2024-06-24: 20 mg via INTRAVENOUS
  Administered 2024-06-24: 30 mg via INTRAVENOUS

## 2024-06-24 MED ORDER — METRONIDAZOLE 500 MG/100ML IV SOLN
500.0000 mg | Freq: Once | INTRAVENOUS | Status: AC
  Administered 2024-06-24: 500 mg via INTRAVENOUS

## 2024-06-24 MED ORDER — PROPOFOL 10 MG/ML IV BOLUS
INTRAVENOUS | Status: DC | PRN
  Administered 2024-06-24: 160 mg via INTRAVENOUS

## 2024-06-24 MED ORDER — PHENYLEPHRINE 80 MCG/ML (10ML) SYRINGE FOR IV PUSH (FOR BLOOD PRESSURE SUPPORT)
PREFILLED_SYRINGE | INTRAVENOUS | Status: DC | PRN
  Administered 2024-06-24: 80 ug via INTRAVENOUS

## 2024-06-24 MED ORDER — CHLORHEXIDINE GLUCONATE 0.12 % MT SOLN
15.0000 mL | Freq: Once | OROMUCOSAL | Status: AC
  Administered 2024-06-24: 15 mL via OROMUCOSAL

## 2024-06-24 MED ORDER — 0.9 % SODIUM CHLORIDE (POUR BTL) OPTIME
TOPICAL | Status: DC | PRN
  Administered 2024-06-24: 1000 mL

## 2024-06-24 MED ORDER — INSULIN ASPART 100 UNIT/ML IJ SOLN: 0.0000 [IU] | INTRAMUSCULAR | Status: DC | PRN

## 2024-06-24 MED ORDER — BUPIVACAINE HCL (PF) 0.5 % IJ SOLN
INTRAMUSCULAR | Status: DC | PRN
  Administered 2024-06-24: 13 mL

## 2024-06-24 MED ORDER — HEMOSTATIC AGENTS (NO CHARGE) OPTIME
TOPICAL | Status: DC | PRN
Start: 2024-06-24 — End: 2024-06-24
  Administered 2024-06-24: 1

## 2024-06-24 MED ORDER — MIDAZOLAM HCL 2 MG/2ML IJ SOLN
INTRAMUSCULAR | Status: DC | PRN
  Administered 2024-06-24: 2 mg via INTRAVENOUS

## 2024-06-24 MED ORDER — LIDOCAINE 2% (20 MG/ML) 5 ML SYRINGE
INTRAMUSCULAR | Status: DC | PRN
  Administered 2024-06-24: 100 mg via INTRAVENOUS

## 2024-06-24 MED ORDER — OXYCODONE HCL 5 MG/5ML PO SOLN: 5.0000 mg | Freq: Once | ORAL | Status: DC | PRN

## 2024-06-24 MED ORDER — POVIDONE-IODINE 10 % EX SWAB: 2.0000 | Freq: Once | CUTANEOUS | Status: DC

## 2024-06-24 MED ORDER — ORAL CARE MOUTH RINSE: 15.0000 mL | Freq: Once | OROMUCOSAL | Status: AC

## 2024-06-24 MED ORDER — LACTATED RINGERS IV SOLN: INTRAVENOUS | Status: DC

## 2024-06-24 MED ORDER — FENTANYL CITRATE (PF) 100 MCG/2ML IJ SOLN
INTRAMUSCULAR | Status: AC
  Filled 2024-06-24: qty 2

## 2024-06-24 MED ORDER — PHENYLEPHRINE 80 MCG/ML (10ML) SYRINGE FOR IV PUSH (FOR BLOOD PRESSURE SUPPORT)
PREFILLED_SYRINGE | INTRAVENOUS | Status: AC
Start: 2024-06-24 — End: 2024-06-24
  Filled 2024-06-24: qty 10

## 2024-06-24 MED ORDER — ONDANSETRON HCL 4 MG/2ML IJ SOLN
INTRAMUSCULAR | Status: AC
Start: 2024-06-24 — End: 2024-06-24
  Filled 2024-06-24: qty 2

## 2024-06-24 MED ORDER — PROPOFOL 10 MG/ML IV BOLUS
INTRAVENOUS | Status: AC
  Filled 2024-06-24: qty 20

## 2024-06-24 MED ORDER — CHLORHEXIDINE GLUCONATE 0.12 % MT SOLN
OROMUCOSAL | Status: AC
  Filled 2024-06-24: qty 15

## 2024-06-24 MED ORDER — FENTANYL CITRATE (PF) 250 MCG/5ML IJ SOLN
INTRAMUSCULAR | Status: DC | PRN
  Administered 2024-06-24: 100 ug via INTRAVENOUS
  Administered 2024-06-24 (×3): 50 ug via INTRAVENOUS

## 2024-06-24 MED ORDER — ROCURONIUM BROMIDE 10 MG/ML (PF) SYRINGE
PREFILLED_SYRINGE | INTRAVENOUS | Status: AC
  Filled 2024-06-24: qty 10

## 2024-06-24 MED ORDER — ONDANSETRON HCL 4 MG/2ML IJ SOLN: 4.0000 mg | Freq: Once | INTRAMUSCULAR | Status: DC | PRN

## 2024-06-24 MED ORDER — CEFAZOLIN SODIUM 1 G IJ SOLR
INTRAMUSCULAR | Status: DC | PRN
  Administered 2024-06-24: 1000 mL

## 2024-06-24 MED ORDER — OXYCODONE HCL 5 MG PO TABS: 5.0000 mg | ORAL_TABLET | Freq: Once | ORAL | Status: DC | PRN

## 2024-06-24 MED ORDER — SUGAMMADEX SODIUM 200 MG/2ML IV SOLN
INTRAVENOUS | Status: DC | PRN
  Administered 2024-06-24: 260 mg via INTRAVENOUS

## 2024-06-24 MED ORDER — SODIUM CHLORIDE 0.9 % IV SOLN: INTRAVENOUS | Status: DC | PRN

## 2024-06-24 MED ORDER — EPHEDRINE SULFATE-NACL 50-0.9 MG/10ML-% IV SOSY: PREFILLED_SYRINGE | INTRAVENOUS | Status: DC | PRN

## 2024-06-24 MED ORDER — ACETAMINOPHEN 500 MG PO TABS
1000.0000 mg | ORAL_TABLET | ORAL | Status: AC
  Administered 2024-06-24: 1000 mg via ORAL

## 2024-06-24 MED ORDER — METRONIDAZOLE 500 MG/100ML IV SOLN
INTRAVENOUS | Status: AC
  Filled 2024-06-24: qty 100

## 2024-06-24 MED ORDER — ONDANSETRON HCL 4 MG/2ML IJ SOLN
INTRAMUSCULAR | Status: DC | PRN
  Administered 2024-06-24: 4 mg via INTRAVENOUS

## 2024-06-24 MED ORDER — DEXAMETHASONE SODIUM PHOSPHATE 10 MG/ML IJ SOLN
INTRAMUSCULAR | Status: AC
  Filled 2024-06-24: qty 1

## 2024-06-24 MED ORDER — FENTANYL CITRATE (PF) 100 MCG/2ML IJ SOLN
25.0000 ug | INTRAMUSCULAR | Status: DC | PRN
  Administered 2024-06-24: 50 ug via INTRAVENOUS

## 2024-06-24 MED ORDER — CEFOXITIN SODIUM 2 G IV SOLR
INTRAVENOUS | Status: AC
  Filled 2024-06-24: qty 2

## 2024-06-24 MED ORDER — DEXAMETHASONE SODIUM PHOSPHATE 10 MG/ML IJ SOLN
INTRAMUSCULAR | Status: DC | PRN
  Administered 2024-06-24: 5 mg via INTRAVENOUS

## 2024-06-24 MED ORDER — FENTANYL CITRATE (PF) 250 MCG/5ML IJ SOLN
INTRAMUSCULAR | Status: AC
  Filled 2024-06-24: qty 5

## 2024-06-24 MED ORDER — MIDAZOLAM HCL 2 MG/2ML IJ SOLN
INTRAMUSCULAR | Status: AC
  Filled 2024-06-24: qty 2

## 2024-06-24 SURGICAL SUPPLY — 58 items
APPLICATOR ARISTA FLEXITIP XL (MISCELLANEOUS) IMPLANT
BARRIER ADHS 3X4 INTERCEED (GAUZE/BANDAGES/DRESSINGS) IMPLANT
CATH FOLEY 3WAY 5CC 16FR (CATHETERS) ×5 IMPLANT
COVER BACK TABLE 60X90IN (DRAPES) ×5 IMPLANT
COVER TIP SHEARS 8 DVNC (MISCELLANEOUS) ×5 IMPLANT
DEFOGGER SCOPE WARM SEASHARP (MISCELLANEOUS) ×5 IMPLANT
DERMABOND ADVANCED .7 DNX12 (GAUZE/BANDAGES/DRESSINGS) ×5 IMPLANT
DRAPE ARM DVNC X/XI (DISPOSABLE) ×20 IMPLANT
DRAPE COLUMN DVNC XI (DISPOSABLE) ×5 IMPLANT
DRAPE SURG IRRIG POUCH 19X23 (DRAPES) ×5 IMPLANT
DRAPE UTILITY XL STRL (DRAPES) ×5 IMPLANT
DRIVER NDL MEGA SUTCUT DVNCXI (INSTRUMENTS) ×5 IMPLANT
DRIVER NDLE MEGA SUTCUT DVNCXI (INSTRUMENTS) ×4 IMPLANT
DURAPREP 26ML APPLICATOR (WOUND CARE) ×5 IMPLANT
ELECTRODE REM PT RTRN 9FT ADLT (ELECTROSURGICAL) ×5 IMPLANT
FORCEPS PROGRASP DVNC XI (FORCEP) ×5 IMPLANT
GAUZE 4X4 16PLY ~~LOC~~+RFID DBL (SPONGE) IMPLANT
GLOVE BIOGEL PI IND STRL 7.0 (GLOVE) ×10 IMPLANT
GLOVE BIOGEL PI MICRO STRL 7 (GLOVE) IMPLANT
GLOVE NEODERM STER SZ 7 (GLOVE) ×15 IMPLANT
GOWN STRL REUS W/ TWL LRG LVL3 (GOWN DISPOSABLE) ×5 IMPLANT
HEMOSTAT ARISTA ABSORB 3G PWDR (HEMOSTASIS) IMPLANT
HIBICLENS CHG 4% 4OZ BTL (MISCELLANEOUS) ×10 IMPLANT
HOLDER FOLEY CATH W/STRAP (MISCELLANEOUS) IMPLANT
IRRIGATION SUCT STRKRFLW 2 WTP (MISCELLANEOUS) ×5 IMPLANT
KIT PINK PAD W/HEAD ARM REST (MISCELLANEOUS) ×5 IMPLANT
KIT TURNOVER KIT B (KITS) ×5 IMPLANT
LEGGING LITHOTOMY PAIR STRL (DRAPES) ×5 IMPLANT
MANIFOLD NEPTUNE II (INSTRUMENTS) ×5 IMPLANT
NS IRRIG 1000ML POUR BTL (IV SOLUTION) ×5 IMPLANT
OBTURATOR OPTICALSTD 8 DVNC (TROCAR) ×5 IMPLANT
OCCLUDER COLPOPNEUMO (BALLOONS) IMPLANT
PACK CYSTO (CUSTOM PROCEDURE TRAY) ×5 IMPLANT
PACK ROBOT WH (CUSTOM PROCEDURE TRAY) ×5 IMPLANT
PACK ROBOTIC GOWN (GOWN DISPOSABLE) ×5 IMPLANT
PAD OB MATERNITY 11 LF (PERSONAL CARE ITEMS) ×5 IMPLANT
RUMI II 3.0CM BLUE KOH-EFFICIE (DISPOSABLE) IMPLANT
RUMI II GYRUS 2.5CM BLUE (DISPOSABLE) IMPLANT
RUMI II GYRUS 3.5CM BLUE (DISPOSABLE) IMPLANT
RUMI II GYRUS 4.0CM BLUE (DISPOSABLE) IMPLANT
SCISSORS MNPLR CVD DVNC XI (INSTRUMENTS) ×5 IMPLANT
SCOPETTES 8 STERILE (MISCELLANEOUS) IMPLANT
SEAL UNIV 5-12 XI (MISCELLANEOUS) ×15 IMPLANT
SEALER VESSEL EXT DVNC XI (MISCELLANEOUS) IMPLANT
SET CYSTO W/LG BORE CLAMP LF (SET/KITS/TRAYS/PACK) ×5 IMPLANT
SET TUBE SMOKE EVAC HIGH FLOW (TUBING) ×5 IMPLANT
SOL PREP POV-IOD 4OZ 10% (MISCELLANEOUS) ×5 IMPLANT
SPIKE FLUID TRANSFER (MISCELLANEOUS) ×5 IMPLANT
SUT MNCRL AB 4-0 PS2 18 (SUTURE) ×5 IMPLANT
SUT VLOC 180 0 6IN GS21 (SUTURE) IMPLANT
SUT VLOC 180 0 9IN GS21 (SUTURE) ×10 IMPLANT
SYSTEM RETRIEVL 5MM INZII UNIV (BASKET) IMPLANT
TIP UTERINE 6.7X10CM GRN DISP (MISCELLANEOUS) IMPLANT
TIP UTERINE 6.7X8CM BLUE DISP (MISCELLANEOUS) IMPLANT
TOWEL GREEN STERILE (TOWEL DISPOSABLE) ×5 IMPLANT
TRAY FOLEY W/BAG SLVR 14FR (SET/KITS/TRAYS/PACK) ×5 IMPLANT
UNDERPAD 30X36 HEAVY ABSORB (UNDERPADS AND DIAPERS) ×5 IMPLANT
WATER STERILE IRR 500ML POUR (IV SOLUTION) ×5 IMPLANT

## 2024-06-24 NOTE — Anesthesia Procedure Notes (Signed)
 Procedure Name: Intubation Date/Time: 06/24/2024 9:51 AM  Performed by: Reathel Turi C, CRNAPre-anesthesia Checklist: Patient identified, Emergency Drugs available, Suction available and Patient being monitored Patient Re-evaluated:Patient Re-evaluated prior to induction Oxygen Delivery Method: Circle system utilized Preoxygenation: Pre-oxygenation with 100% oxygen Induction Type: IV induction Ventilation: Mask ventilation without difficulty Laryngoscope Size: Mac and 3 Grade View: Grade II Tube type: Oral Tube size: 7.0 mm Number of attempts: 1 Airway Equipment and Method: Stylet and Oral airway Placement Confirmation: ETT inserted through vocal cords under direct vision, positive ETCO2 and breath sounds checked- equal and bilateral Secured at: 21 cm Tube secured with: Tape Dental Injury: Teeth and Oropharynx as per pre-operative assessment

## 2024-06-24 NOTE — Telephone Encounter (Signed)
 Pharmacy Patient Advocate Encounter  Received notification from [facility] that Prior Authorization for FreeStyle Libre 3 Plus Sensor  has been DENIED.  Full denial letter will be uploaded to the media tab. See denial reason below.   PA #/Case ID/Reference #: [id number]

## 2024-06-24 NOTE — Transfer of Care (Signed)
 Immediate Anesthesia Transfer of Care Note  Patient: Tanya Dorsey  Procedure(s) Performed: HYSTERECTOMY, TOTAL, LAPAROSCOPIC, ROBOT-ASSISTED WITH SALPINGECTOMY (Bilateral: Abdomen) EXCISION, CYST, OVARY, ROBOT-ASSISTED, LAPAROSCOPIC (Right: Abdomen) CYSTOSCOPY (Bladder) LYSIS, ADHESIONS, ROBOT-ASSISTED, LAPAROSCOPIC (Abdomen) EXCISION, CYST, OVARY (Abdomen)  Patient Location: PACU  Anesthesia Type:General  Level of Consciousness: awake, alert , and sedated  Airway & Oxygen Therapy: Patient Spontanous Breathing and Patient connected to face mask oxygen  Post-op Assessment: Report given to RN and Post -op Vital signs reviewed and stable  Post vital signs: Reviewed and stable  Last Vitals:  Vitals Value Taken Time  BP 144/61 06/24/24 12:33  Temp    Pulse 86 06/24/24 12:36  Resp 20 06/24/24 12:36  SpO2 100 % 06/24/24 12:36  Vitals shown include unfiled device data.  Last Pain:  Vitals:   06/24/24 0727  TempSrc: Oral  PainSc: 0-No pain      Patients Stated Pain Goal: 4 (06/24/24 0727)  Complications: No notable events documented.

## 2024-06-24 NOTE — Telephone Encounter (Signed)
 Underwent robotic hysterectomy, bilateral salpingectomy, cystoscopy by Dr. Glennon today.  Reviewed concerns with patient's husband who called on-call service. Reports some blood tinged discharge from left sided laparoscopic incision Dermabond in place and denies erythema or skin separation.  Denies bruising or swelling Pain well controlled at 2/10.  Advised gauze over incision and monitor discharge or bleeding. Call if she experiences fever > 100.4, increased bleeding or change in discharge, uncontrolled pain, N/V. Patient's husband expresses understanding with all questions answered.

## 2024-06-24 NOTE — Interval H&P Note (Signed)
 History and Physical Interval Note:  06/24/2024 7:17 AM  Tanya Dorsey  has presented today for surgery, with the diagnosis of DUB, dysmenorrhea, menorrhagia with irregular cycle.  The various methods of treatment have been discussed with the patient and family. After consideration of risks, benefits and other options for treatment, the patient has consented to  Procedure(s): HYSTERECTOMY, TOTAL, LAPAROSCOPIC, ROBOT-ASSISTED WITH SALPINGECTOMY (Bilateral) EXCISION, CYST, OVARY, ROBOT-ASSISTED, LAPAROSCOPIC (Right) CYSTOSCOPY (N/A) as a surgical intervention.  The patient's history has been reviewed, patient examined, no change in status, stable for surgery.  I have reviewed the patient's chart and labs.  Questions were answered to the patient's satisfaction.     Tanya Dorsey

## 2024-06-24 NOTE — Discharge Instructions (Signed)

## 2024-06-24 NOTE — Telephone Encounter (Signed)
 Please resubmit PA for Freestyle Libre 3 Plus Sensor. Pt does have Diabetes. Not sure why denied?

## 2024-06-24 NOTE — Anesthesia Preprocedure Evaluation (Signed)
 Anesthesia Evaluation  Patient identified by MRN, date of birth, ID band Patient awake    Reviewed: Allergy & Precautions, NPO status , Patient's Chart, lab work & pertinent test results, reviewed documented beta blocker date and time   History of Anesthesia Complications Negative for: history of anesthetic complications  Airway Mallampati: III  TM Distance: >3 FB     Dental no notable dental hx.    Pulmonary neg COPD   breath sounds clear to auscultation       Cardiovascular (-) angina (-) CAD and (-) Past MI  Rhythm:Regular Rate:Normal     Neuro/Psych neg Seizures PSYCHIATRIC DISORDERS Anxiety Depression       GI/Hepatic ,neg GERD  ,,(+) neg Cirrhosis        Endo/Other  diabetes, Well Controlled, Type 2  Class 3 obesity  Renal/GU Renal disease     Musculoskeletal  (+) Arthritis ,    Abdominal   Peds  Hematology  (+) Blood dyscrasia, anemia   Anesthesia Other Findings   Reproductive/Obstetrics                              Anesthesia Physical Anesthesia Plan  ASA: 3  Anesthesia Plan: General   Post-op Pain Management:    Induction: Intravenous  PONV Risk Score and Plan: 2 and Ondansetron  and Dexamethasone   Airway Management Planned: Oral ETT  Additional Equipment:   Intra-op Plan:   Post-operative Plan: Extubation in OR  Informed Consent: I have reviewed the patients History and Physical, chart, labs and discussed the procedure including the risks, benefits and alternatives for the proposed anesthesia with the patient or authorized representative who has indicated his/her understanding and acceptance.     Dental advisory given  Plan Discussed with: CRNA  Anesthesia Plan Comments:         Anesthesia Quick Evaluation

## 2024-06-24 NOTE — Anesthesia Postprocedure Evaluation (Signed)
 Anesthesia Post Note  Patient: Tanya Dorsey  Procedure(s) Performed: HYSTERECTOMY, TOTAL, LAPAROSCOPIC, ROBOT-ASSISTED WITH SALPINGECTOMY (Bilateral: Abdomen) EXCISION, CYST, OVARY, ROBOT-ASSISTED, LAPAROSCOPIC (Right: Abdomen) CYSTOSCOPY (Bladder) LYSIS, ADHESIONS, ROBOT-ASSISTED, LAPAROSCOPIC (Abdomen) EXCISION, CYST, OVARY (Abdomen)     Patient location during evaluation: PACU Anesthesia Type: General Level of consciousness: awake and alert Pain management: pain level controlled Vital Signs Assessment: post-procedure vital signs reviewed and stable Respiratory status: spontaneous breathing, nonlabored ventilation, respiratory function stable and patient connected to nasal cannula oxygen Cardiovascular status: blood pressure returned to baseline and stable Postop Assessment: no apparent nausea or vomiting Anesthetic complications: no   No notable events documented.  Last Vitals:  Vitals:   06/24/24 1315 06/24/24 1400  BP: 123/76 124/76  Pulse: 76 82  Resp: 17 16  Temp:  36.5 C  SpO2: 98% 99%    Last Pain:  Vitals:   06/24/24 1400  TempSrc:   PainSc: 4                  Lynwood MARLA Cornea

## 2024-06-24 NOTE — Op Note (Signed)
 06/24/2024  [id number] Tanya Dorsey        OPERATIVE REPORT   Preop Diagnosis: menorrhagia, dysmenorrhea, anemia, right ovarian cyst, pelvic pain, history of cesarean delivery Procedure: robotic hysterectomy, bilateral salpingectomy, cystoscopy   Surgeon: Dr. Almarie Sauer Emersynn Deatley Assistant: Judyann Rattler, RN   Fluids: please see anesthesia report   Complications: None Anesthesia: General     Findings:  boggy contour 8cm uterus, normal tubes, right ovarian simple 5cm cyst along with right ovarian 2cm dermoid cyst that was both removed. Adhesions from left tube to left side wall  Cystoscopy at the end of the case with normal bladder and patent ureters bilaterally.   Estimated blood loss: 25cc   Specimens: Uterus, cervix and bilateral tubes, right ovarian cyst wall and dermoid cyst   Disposition of specimen: Pathology          Patient is taken to the operating room. She is placed in the supine position. She is a running IV in place. Informed consent was present on the chart. SCDs on her lower extremities and functioning properly. Patient was positioned while she was awake.  Her legs were placed in the low lithotomy position in [place] stirrups. Her arms were tucked by the side.  General endotracheal anesthesia was administered by the anesthesia staff without difficulty.       Dura prep was then used to prep the abdomen and Hibiclens was used to prep the inner thighs, perineum and vagina. Once 3 minutes had past the patient was draped in a normal standard fashion. A proper time out was performed and everyone agreed.  The legs were lifted to the high lithotomy position. A bivalve speculum was inserted into the vagina and the anterior lip of the cervix was grasped with single-tooth tenaculum.  The uterus sounded to 8 cm. Pratt dilators were used to dilate the cervix.  The RUMI uterine manipulator was obtained inserted into the endometrial cavity and the bulb of the disposable tip  was inflated with 8 cc of normal saline. There was a good fit of the KOH ring around the cervix. The tenaculum and bivavle speculum was removed. There is also good manipulation of the uterus.  A Foley catheter was placed to straight drain.  Clear urine was noted. Legs were lowered to the low lithotomy position and attention was turned the abdomen.   Superior to the umbilicus, marcaine  0.25% used to anesthetize the skin.  Using #11 blade, 8mm skin incision was made.  The 8mm robotic trocar and sleeve was inserted under direct visualization.  CO2 gas was  started and patient was placed in trendelenburg position.  Two additional 8mm ports were placed under direct visualization in the left and right lower quadrant.     Ureters were identifies.  Attention was turned to the left side. The left tube was freed from the left side wall and the adhesions from the bowel released on this side as well.  The tube was elevated and the mesosalpinx was desiccated with the vessel sealer.  The left uterine ovarian pedicle was serially clamped cauterized and incised. Left round ligament was serially clamped cauterized and incised. The anterior and posterior peritoneum of the inferior leaf of the broad ligament were opened. The beginning of the bladder flap was created.  The bladder was taken down below the level of the KOH ring. The left uterine artery skeletonized and then just superior to the KOH ring this vessel was serially clamped, cauterized, and incised.   Attention was turned the right  side.  The right ovarian simple cyst was opened and the cyst wall was removed.  Adjacent to this was a small dermoid cyst that was removed and placed in a bag and sent to pathology.  Good hemostasis was removed.  The uterus was placed on stretch to the opposite side.    The mesosalpinx was incised freeing the tube. Then the right uterine ovarian pedicle was serially clamped cauterized and incised. Next the right round ligament was serially  clamped cauterized and incised. The anterior posterior peritoneum of the inferiorly for the broad ligament were opened. The anterior peritoneum was carried across to the dissection on the left side. The remainder of the bladder flap was created using sharp dissection. The bladder was well below the level of the KOH ring. The right uterine artery skeletonized. Then the right uterine artery, above the level of the KOH ring, was serially clamped cauterized and incised. The uterus was devascularized at this point.   The colpotomy was performed.  This was carried around a circumferential fashion until the vaginal mucosa was completely incised in the specimen was freed.  The specimen was then delivered to the vagina intact.  A vaginal occlusive device was used to maintain the pneumoperitoneum   Instruments were changed with a needle driver and prograsp.  Using a 9 inch  zero V-lock suture, the cuff was closed by incorporating the anterior and posterior vaginal mucosa in each stitch. This was carried across all the way to the left corner and a running fashion. Two stitches were brought back towards the midline and the suture was cut flush with the vagina. The needle was brought out the pelvis. The pelvis was irrigated. All pedicles were inspected. No bleeding was noted.   Co2 pressures were lowered to 8mm Hg.  Again, no bleeding was noted.  Ureters were noted deep in the pelvis to be peristalsing.  At this point the procedure was completed.  The remaining instruments were removed.  The ports were removed under direct visualization of the laparoscope and the pneumoperitoneum was relieved.   The skin was then closed with subcuticular stitches of 3-0 Vicryl. The skin was cleansed Dermabond was applied. Attention was then turned the vagina and the cuff was inspected. No bleeding was noted.  The Foley catheter was removed.  Cystoscopy was performed.  No sutures or bladder injuries were noted.  Ureters were noted with  normal urine jets from each one was seen.  Foley was left out after the cystoscopic fluid was drained and cystoscope removed.  Sponge, lap, needle, instrument counts were correct x2. Patient tolerated the procedure very well. She was awakened from anesthesia, extubated and taken to recovery in stable condition.      Dr. Glennon

## 2024-06-25 ENCOUNTER — Encounter (HOSPITAL_COMMUNITY): Payer: Self-pay | Admitting: Obstetrics and Gynecology

## 2024-06-27 LAB — SURGICAL PATHOLOGY

## 2024-06-27 NOTE — Telephone Encounter (Signed)
 Please see message.

## 2024-06-28 ENCOUNTER — Encounter: Payer: Self-pay | Admitting: Physician Assistant

## 2024-06-28 ENCOUNTER — Other Ambulatory Visit (HOSPITAL_COMMUNITY): Payer: Self-pay

## 2024-06-28 ENCOUNTER — Ambulatory Visit: Payer: Self-pay | Admitting: Obstetrics and Gynecology

## 2024-06-28 ENCOUNTER — Other Ambulatory Visit: Payer: Self-pay | Admitting: Physician Assistant

## 2024-06-28 DIAGNOSIS — R9431 Abnormal electrocardiogram [ECG] [EKG]: Secondary | ICD-10-CM

## 2024-06-28 MED ORDER — VILAZODONE HCL 40 MG PO TABS
40.0000 mg | ORAL_TABLET | Freq: Every day | ORAL | 1 refills | Status: DC
  Filled 2024-06-28: qty 90, 90d supply, fill #0
  Filled 2024-10-09: qty 90, 90d supply, fill #1

## 2024-06-28 NOTE — Telephone Encounter (Signed)
 Left message on voicemail to call office.

## 2024-06-28 NOTE — Telephone Encounter (Signed)
 Samantha see message and advise about refill. Pt called back asked her about Vilazodone , are you not seeing Psychiatry Dr. Shyrl anymore? Pt said he is no longer accepting her insurance and needs refill on 40 mg not 20 mg was increased and will need a new prescription in a week. Asked what pharmacy? Pt said [facility].

## 2024-06-28 NOTE — Telephone Encounter (Signed)
 Please see message. Okay to send Cardiology referral?

## 2024-06-29 ENCOUNTER — Telehealth (HOSPITAL_COMMUNITY): Payer: Self-pay

## 2024-06-29 ENCOUNTER — Other Ambulatory Visit (HOSPITAL_COMMUNITY): Payer: Self-pay

## 2024-06-30 ENCOUNTER — Other Ambulatory Visit (HOSPITAL_COMMUNITY): Payer: Self-pay

## 2024-07-01 ENCOUNTER — Other Ambulatory Visit (HOSPITAL_COMMUNITY): Payer: Self-pay

## 2024-07-02 ENCOUNTER — Other Ambulatory Visit (HOSPITAL_COMMUNITY): Payer: Self-pay

## 2024-07-04 ENCOUNTER — Other Ambulatory Visit (HOSPITAL_COMMUNITY): Payer: Self-pay

## 2024-07-07 ENCOUNTER — Other Ambulatory Visit (HOSPITAL_COMMUNITY): Payer: Self-pay

## 2024-07-07 ENCOUNTER — Telehealth (HOSPITAL_COMMUNITY): Payer: Self-pay | Admitting: Pharmacy Technician

## 2024-07-07 ENCOUNTER — Encounter (HOSPITAL_COMMUNITY): Payer: Self-pay

## 2024-07-07 ENCOUNTER — Ambulatory Visit (INDEPENDENT_AMBULATORY_CARE_PROVIDER_SITE_OTHER): Admitting: Obstetrics and Gynecology

## 2024-07-07 VITALS — BP 110/78 | HR 85 | Wt 282.8 lb

## 2024-07-07 DIAGNOSIS — Z09 Encounter for follow-up examination after completed treatment for conditions other than malignant neoplasm: Secondary | ICD-10-CM

## 2024-07-07 MED ORDER — FLUCONAZOLE 150 MG PO TABS
150.0000 mg | ORAL_TABLET | Freq: Once | ORAL | 3 refills | Status: AC
  Filled 2024-07-07: qty 1, 1d supply, fill #0

## 2024-07-07 MED ORDER — ESTRADIOL 0.1 MG/GM VA CREA
1.0000 | TOPICAL_CREAM | Freq: Every day | VAGINAL | 12 refills | Status: DC
  Filled 2024-07-07: qty 42.5, 30d supply, fill #0

## 2024-07-07 MED ORDER — CEPHALEXIN 500 MG PO CAPS
500.0000 mg | ORAL_CAPSULE | Freq: Four times a day (QID) | ORAL | 0 refills | Status: AC
  Filled 2024-07-07: qty 28, 7d supply, fill #0

## 2024-07-07 NOTE — Progress Notes (Signed)
 Patient presents for 2 week postop from [facility], bilateral salpingectomy, cystoscopy. She is doing well. No fevers, VB, dysuria or severe abdominal pain. Umbilical incision opened an with some pus   BP 110/78   Pulse 85   Wt 282 lb 12.8 oz (128.3 kg)   LMP 05/27/2024 (Exact Date)   SpO2 98%   BMI 46.34 kg/m   Abdomen: incisions umbilical incision with opening but healing some redness surrouding  A/p PO from [facility] 2 weeks doing well Encouraged no heavy lifting, pushing, pulling greater than 13 lbs for full 6 weeks 2. Pelvic rest for the entire 10 wks and cleared with vaginal exam.  To begin vaginal estrogen to aid in cuff healing.  3. RTC with any concerns or with heavy bleeding, fevers or severe abdominal pain and with 6 wk and 10 week check 4. To begin keflex for umbilical infection prevention. Diflucan after she finishes to prevent a vaginal yeast infection.  Dr. Glennon

## 2024-07-08 ENCOUNTER — Other Ambulatory Visit (HOSPITAL_COMMUNITY): Payer: Self-pay

## 2024-07-08 ENCOUNTER — Telehealth (HOSPITAL_COMMUNITY): Payer: Self-pay

## 2024-07-08 NOTE — Telephone Encounter (Signed)
 Pharmacy Patient Advocate Encounter   Received notification from Pt Calls Messages that prior authorization for Estradiol  0.1 mg/gm vaginal cream is required/requested.   Insurance verification completed.   The patient is insured through [facility] [facility] .   Per test claim:  Estring  ring, Premarin  or Vagifem  tablets is preferred by the insurance.  If suggested medication is appropriate, Please send in a new RX and discontinue this one. If not, please advise as to why it's not appropriate so that we may request a Prior Authorization. Please note, some preferred medications may still require a PA.  If the suggested medications have not been trialed and there are no contraindications to their use, the PA will not be submitted, as it will not be approved.

## 2024-07-08 NOTE — Telephone Encounter (Signed)
 Pharmacy Patient Advocate Encounter  Received notification from [facility] that Prior Authorization for FreeStyle Libre 3 Plus Sensor has been APPROVED from 07/08/24 to 07/08/25. Ran test claim, Copay is $0. This test claim was processed through [facility]- copay amounts may vary at other pharmacies due to pharmacy/plan contracts, or as the patient moves through the different stages of their insurance plan.   PA #/Case ID/Reference #: [id number]

## 2024-07-08 NOTE — Telephone Encounter (Signed)
 PA request has been Received. New Encounter has been or will be created for follow up. For additional info see Pharmacy Prior Auth telephone encounter from 07/08/24.

## 2024-07-08 NOTE — Telephone Encounter (Signed)
 Pharmacy Patient Advocate Encounter   Received notification from Pt Calls Messages that prior authorization for FreeStyle Libre 3 Plus Sensor  is required/requested.   Insurance verification completed.   The patient is insured through [facility] .   Per test claim: PA required; PA submitted to above mentioned insurance via Latent Key/confirmation #/EOC [id number] Status is pending

## 2024-07-12 ENCOUNTER — Encounter: Payer: Self-pay | Admitting: Physician Assistant

## 2024-07-12 ENCOUNTER — Other Ambulatory Visit (HOSPITAL_COMMUNITY): Payer: Self-pay

## 2024-07-12 ENCOUNTER — Other Ambulatory Visit: Payer: Self-pay

## 2024-07-12 MED ORDER — PREMARIN 0.625 MG/GM VA CREA
TOPICAL_CREAM | VAGINAL | 6 refills | Status: AC
  Filled 2024-07-12: qty 30, 90d supply, fill #0
  Filled 2024-10-24: qty 30, 90d supply, fill #1

## 2024-07-12 MED ORDER — PAXLOVID (300/100) 20 X 150 MG & 10 X 100MG PO TBPK
3.0000 | ORAL_TABLET | Freq: Two times a day (BID) | ORAL | 0 refills | Status: DC
  Filled 2024-07-12 (×2): qty 30, 5d supply, fill #0

## 2024-07-20 ENCOUNTER — Ambulatory Visit: Admitting: Dietician

## 2024-08-03 ENCOUNTER — Encounter: Payer: Self-pay | Admitting: Physician Assistant

## 2024-08-03 ENCOUNTER — Other Ambulatory Visit (HOSPITAL_COMMUNITY): Payer: Self-pay

## 2024-08-03 ENCOUNTER — Ambulatory Visit: Admitting: Physician Assistant

## 2024-08-03 VITALS — BP 140/80 | HR 98 | Temp 98.0°F | Ht 65.5 in | Wt 285.2 lb

## 2024-08-03 DIAGNOSIS — F988 Other specified behavioral and emotional disorders with onset usually occurring in childhood and adolescence: Secondary | ICD-10-CM

## 2024-08-03 DIAGNOSIS — H9202 Otalgia, left ear: Secondary | ICD-10-CM

## 2024-08-03 DIAGNOSIS — Z23 Encounter for immunization: Secondary | ICD-10-CM | POA: Diagnosis not present

## 2024-08-03 DIAGNOSIS — Z7985 Long-term (current) use of injectable non-insulin antidiabetic drugs: Secondary | ICD-10-CM

## 2024-08-03 DIAGNOSIS — E119 Type 2 diabetes mellitus without complications: Secondary | ICD-10-CM

## 2024-08-03 MED ORDER — TIRZEPATIDE 10 MG/0.5ML ~~LOC~~ SOAJ
10.0000 mg | SUBCUTANEOUS | 0 refills | Status: DC
  Filled 2024-08-03: qty 2, 28d supply, fill #0
  Filled 2024-09-01 – 2024-09-12 (×2): qty 2, 28d supply, fill #1
  Filled 2024-10-09: qty 2, 28d supply, fill #2

## 2024-08-03 NOTE — Progress Notes (Signed)
 Tanya Dorsey is a 38 y.o. adult here for a follow up of a pre-existing problem.  History of Present Illness:   Chief Complaint  Patient presents with   Otalgia    Pt c/o left ear pain since Aug 20th. Pt had COVID and ear pain persists.   Weight Management Screening    Pt is currently taking Mounjaro  7.5 mg, tolerating well, would like to discuss increasing dose.    Discussed the use of AI scribe software for clinical note transcription with the patient, who gave verbal consent to proceed.  History of Present Illness Tanya Dorsey is a 38 year old who presents with persistent ear discomfort and follow-up for recent COVID-19 infection.  She contracted COVID-19 on August 20th and was treated with Paxlovid . After initially testing negative, she experienced a rebound of symptoms that were worse than the initial infection. She currently feels tired but attributes this to the recent illness. No other lingering COVID-19 symptoms are present.  She has experienced persistent ear discomfort since August 20th, characterized by pain and a crackling sensation. She uses Astepro nasal spray, which is somewhat helpful, and has previously used Flonase and antihistamines like Zyrtec  and Xyzal. She is not currently on an antihistamine but has used decongestants during acute COVID-19 symptoms. No other symptoms related to her ear discomfort are present.  She underwent a hysterectomy recently and is undergoing physical therapy for tight hamstrings and core strength issues. She is on Mounjaro  7.5 mg weekly for blood sugar management, with stable levels rarely exceeding 150 mg/dL. She uses Adderall without concerns.    Past Medical History:  Diagnosis Date   ADHD    Anemia    Anxiety    Arthritis    B12 deficiency    Depression    Dysmenorrhea    Elevated blood pressure reading in office without diagnosis of hypertension    Eustachian tube dysfunction, bilateral    ENT-- dr forbes. formeister  ([facility])  in office s/p bilateral myringotomies tympanotomy ear tube placement   (06-20-2024  pt stated both tubes are out)   Menorrhagia    Type 2 diabetes mellitus (HCC)    followed by pcp :  treated w/ mounjaro   (06-20-2024  checks blood sugar at home ,  has Libre 3plus ,  fasting average 90--150)   Vitamin D  deficiency    Wears contact lenses    Wears hearing aid in both ears      Social History   Tobacco Use   Smoking status: Never    Passive exposure: Never   Smokeless tobacco: Never  Vaping Use   Vaping status: Never Used  Substance Use Topics   Alcohol use: No   Drug use: Never    Past Surgical History:  Procedure Laterality Date   CESAREAN SECTION N/A 08/02/2014   Procedure: CESAREAN SECTION;  Surgeon: Nena DELENA App, MD;  Location: [facility];  Service: Obstetrics;  Laterality: N/A;   CYSTOSCOPY N/A 06/24/2024   Procedure: CYSTOSCOPY;  Surgeon: Glennon Almarie POUR, MD;  Location: [facility] OR;  Service: Gynecology;  Laterality: N/A;   HYSTERECTOMY, TOTAL, LAPAROSCOPIC, ROBOT-ASSISTED WITH SALPINGECTOMY Bilateral 06/24/2024   Procedure: HYSTERECTOMY, TOTAL, LAPAROSCOPIC, ROBOT-ASSISTED WITH SALPINGECTOMY;  Surgeon: Glennon Almarie POUR, MD;  Location: [facility] OR;  Service: Gynecology;  Laterality: Bilateral;   LAPAROSCOPIC CHOLECYSTECTOMY  2009   OVARIAN CYST REMOVAL N/A 06/24/2024   Procedure: EXCISION, CYST, OVARY;  Surgeon: Glennon Almarie POUR, MD;  Location: [facility] OR;  Service: Gynecology;  Laterality: N/A;  Dermoid cyst removal   ROBOTIC ASSISTED LAPAROSCOPIC LYSIS OF ADHESION N/A 06/24/2024   Procedure: LYSIS, ADHESIONS, ROBOT-ASSISTED, LAPAROSCOPIC;  Surgeon: Glennon Almarie POUR, MD;  Location: [facility] OR;  Service: Gynecology;  Laterality: N/A;   ROBOTIC ASSISTED LAPAROSCOPIC OVARIAN CYSTECTOMY Right 06/24/2024   Procedure: EXCISION, CYST, OVARY, ROBOT-ASSISTED, LAPAROSCOPIC;  Surgeon: Glennon Almarie POUR, MD;  Location: [facility] OR;  Service: Gynecology;  Laterality: Right;   TONSILLECTOMY  1996    WISDOM TOOTH EXTRACTION      Family History  Problem Relation Age of Onset   Diabetes Mother    Hypertension Mother    Thyroid disease Mother    Hypertension Father    Thyroid cancer Neg Hx     No Known Allergies  Current Medications:   Current Outpatient Medications:    amphetamine -dextroamphetamine  (ADDERALL XR) 25 MG 24 hr capsule, Take 1 capsule by mouth every morning., Disp: 30 capsule, Rfl: 0   amphetamine -dextroamphetamine  (ADDERALL XR) 25 MG 24 hr capsule, Take 1 capsule by mouth every morning., Disp: 30 capsule, Rfl: 0   amphetamine -dextroamphetamine  (ADDERALL XR) 25 MG 24 hr capsule, Take 1 capsule by mouth every morning., Disp: 30 capsule, Rfl: 0   B Complex CAPS, Take 1 capsule by mouth daily., Disp: , Rfl:    buPROPion  (WELLBUTRIN  SR) 150 MG 12 hr tablet, Take 1 tablet (150 mg total) by mouth 2 (two) times daily., Disp: 180 tablet, Rfl: 1   cholecalciferol (VITAMIN D3) 25 MCG (1000 UNIT) tablet, Take 1,000 Units by mouth daily., Disp: , Rfl:    conjugated estrogens  (PREMARIN ) vaginal cream, Place 1/2 gram vaginally twice weekly, Disp: 30 g, Rfl: 6   Continuous Glucose Sensor (FREESTYLE LIBRE 3 PLUS SENSOR) MISC, Change sensor every 15 days., Disp: 2 each, Rfl: 2   cyanocobalamin  (VITAMIN B12) 1000 MCG/ML injection, Inject 1 ml into the muscle for weekly for 3 weeks then monthly, Disp: 10 mL, Rfl: 0   LORazepam  (ATIVAN ) 0.5 MG tablet, Take 1 tablet (0.5 mg total) by mouth daily as needed., Disp: 20 tablet, Rfl: 0   Olopatadine  HCl 0.2 % SOLN, Apply 1 drop to eye 2 (two) times daily., Disp: 2.5 mL, Rfl: 0   tirzepatide  (MOUNJARO ) 10 MG/0.5ML Pen, Inject 10 mg into the skin once a week., Disp: 6 mL, Rfl: 0   Vilazodone  HCl (VIIBRYD ) 40 MG TABS, Take 1 tablet (40 mg total) by mouth daily., Disp: 90 tablet, Rfl: 1   Review of Systems:   Negative unless otherwise specified per HPI.  Vitals:   Vitals:   08/03/24 0822 08/03/24 0855  BP: (!) 130/90 (!) 140/80  Pulse: 98    Temp: 98 F (36.7 C)   TempSrc: Temporal   SpO2: 98%   Weight: 285 lb 4 oz (129.4 kg)   Height: 5' 5.5 (1.664 m)      Body mass index is 46.75 kg/m.  Physical Exam:   Physical Exam Vitals and nursing note reviewed.  Constitutional:      General: She is not in acute distress.    Appearance: She is well-developed. She is not ill-appearing or toxic-appearing.  HENT:     Head: Normocephalic and atraumatic.     Right Ear: Ear canal and external ear normal. A middle ear effusion is present. Tympanic membrane is not erythematous, retracted or bulging.     Left Ear: Ear canal and external ear normal. A middle ear effusion is present. Tympanic membrane is not erythematous, retracted or bulging.     Nose: Nose normal.  Right Sinus: No maxillary sinus tenderness or frontal sinus tenderness.     Left Sinus: No maxillary sinus tenderness or frontal sinus tenderness.     Mouth/Throat:     Pharynx: Uvula midline. No posterior oropharyngeal erythema.  Eyes:     General: Lids are normal.     Conjunctiva/sclera: Conjunctivae normal.  Neck:     Trachea: Trachea normal.  Cardiovascular:     Rate and Rhythm: Normal rate and regular rhythm.     Heart sounds: Normal heart sounds, S1 normal and S2 normal.  Pulmonary:     Effort: Pulmonary effort is normal.     Breath sounds: Normal breath sounds. No decreased breath sounds, wheezing, rhonchi or rales.  Lymphadenopathy:     Cervical: No cervical adenopathy.  Skin:    General: Skin is warm and dry.  Neurological:     Mental Status: She is alert.  Psychiatric:        Speech: Speech normal.        Behavior: Behavior normal. Behavior is cooperative.     Assessment and Plan:   Assessment and Plan Assessment & Plan Left ear pain Suspect eustachian tube dysfunction  - Start antihistamine such as Claritin, Allegra, or Zyrtec . - Use Flonase in the morning and Astepro at night or vice versa. - Consider Sudafed for a few days if needed,  monitor blood pressure due to Adderall interaction. - Consider referral for permanent ear tubes if symptoms persist after insurance change.  Type 2 diabetes mellitus without complications Blood sugar well-controlled, rarely exceeding 150 mg/dL. Current Tirzepatide  (Mounjaro ) 7.5 mg not affecting appetite. - Send prescription for three-month supply of Mounjaro  10 mg weekly  Attention deficit disorder, unspecified type Managed with Amphetamine -dextroamphetamine  (Adderall XR) 25 mg. No concerns with current treatment. - Continue current Adderall XR regimen. - follow up in 3 months, sooner if concerns     Lucie Buttner, PA-C

## 2024-08-06 ENCOUNTER — Other Ambulatory Visit (HOSPITAL_COMMUNITY): Payer: Self-pay

## 2024-08-08 ENCOUNTER — Encounter: Admitting: Obstetrics and Gynecology

## 2024-08-16 ENCOUNTER — Other Ambulatory Visit (HOSPITAL_COMMUNITY): Payer: Self-pay

## 2024-08-23 ENCOUNTER — Other Ambulatory Visit (HOSPITAL_COMMUNITY): Payer: Self-pay

## 2024-08-24 ENCOUNTER — Other Ambulatory Visit (HOSPITAL_COMMUNITY): Payer: Self-pay

## 2024-08-24 MED ORDER — PAXLOVID (300/100) 20 X 150 MG & 10 X 100MG PO TBPK
3.0000 | ORAL_TABLET | Freq: Two times a day (BID) | ORAL | 0 refills | Status: DC
  Filled 2024-08-24 (×2): qty 30, 5d supply, fill #0

## 2024-08-31 ENCOUNTER — Ambulatory Visit (INDEPENDENT_AMBULATORY_CARE_PROVIDER_SITE_OTHER): Admitting: Physician Assistant

## 2024-08-31 ENCOUNTER — Encounter: Payer: Self-pay | Admitting: Physician Assistant

## 2024-08-31 VITALS — BP 130/80 | HR 88 | Temp 98.2°F | Ht 65.5 in | Wt 284.0 lb

## 2024-08-31 DIAGNOSIS — R058 Other specified cough: Secondary | ICD-10-CM

## 2024-08-31 NOTE — Progress Notes (Signed)
 Tanya Dorsey is a 38 y.o. adult here for a follow up of a pre-existing problem.  History of Present Illness:   Chief Complaint  Patient presents with   Cough    Pt is on day 10 of COVID, c/o crackling at night in her lungs, coughing and expectorating white clear sputum. Denies shortness of breath.   Discussed the use of AI scribe software for clinical note transcription with the patient, who gave verbal consent to proceed.  History of Present Illness   Tanya Dorsey is a 38 year old who presents with recurrent COVID-19 infection.  She is experiencing her second COVID-19 infection within 45 days. The current episode is milder, with symptoms primarily of congestion. The first episode involved fever, chills, and significant fatigue. She tested negative between the two infections and again yesterday.  She has taken Paxlovid  for both episodes. During the first episode, she experienced a rebound of symptoms two days after completing the course. She completed her last dose for the current episode on Monday morning and is monitoring for potential rebound symptoms.  She experiences a 'crackle' on exhalation at night, which is improving, and denies shortness of breath. She has chronic fullness in her ear, particularly noticeable when lying down, sometimes causing pressure and cough. She has not tried Mucinex.  She notes frequent blood sugar lows during both COVID-19 episodes, despite maintaining her usual eating habits. No severe sore throat or shortness of breath. She has clear nasal discharge and confirms overall symptom improvement.       Past Medical History:  Diagnosis Date   ADHD    Anemia    Anxiety    Arthritis    B12 deficiency    Depression    Dysmenorrhea    Elevated blood pressure reading in office without diagnosis of hypertension    Eustachian tube dysfunction, bilateral    ENT-- dr forbes. formeister ([facility])  in office s/p bilateral myringotomies tympanotomy ear tube  placement   (06-20-2024  pt stated both tubes are out)   Menorrhagia    Type 2 diabetes mellitus (HCC)    followed by pcp :  treated w/ mounjaro   (06-20-2024  checks blood sugar at home ,  has Libre 3plus ,  fasting average 90--150)   Vitamin D  deficiency    Wears contact lenses    Wears hearing aid in both ears      Social History   Tobacco Use   Smoking status: Never    Passive exposure: Never   Smokeless tobacco: Never  Vaping Use   Vaping status: Never Used  Substance Use Topics   Alcohol use: No   Drug use: Never    Past Surgical History:  Procedure Laterality Date   CESAREAN SECTION N/A 08/02/2014   Procedure: CESAREAN SECTION;  Surgeon: Nena DELENA App, MD;  Location: [facility];  Service: Obstetrics;  Laterality: N/A;   CYSTOSCOPY N/A 06/24/2024   Procedure: CYSTOSCOPY;  Surgeon: Glennon Almarie POUR, MD;  Location: [facility] OR;  Service: Gynecology;  Laterality: N/A;   HYSTERECTOMY, TOTAL, LAPAROSCOPIC, ROBOT-ASSISTED WITH SALPINGECTOMY Bilateral 06/24/2024   Procedure: HYSTERECTOMY, TOTAL, LAPAROSCOPIC, ROBOT-ASSISTED WITH SALPINGECTOMY;  Surgeon: Glennon Almarie POUR, MD;  Location: [facility] OR;  Service: Gynecology;  Laterality: Bilateral;   LAPAROSCOPIC CHOLECYSTECTOMY  2009   OVARIAN CYST REMOVAL N/A 06/24/2024   Procedure: EXCISION, CYST, OVARY;  Surgeon: Glennon Almarie POUR, MD;  Location: [facility] OR;  Service: Gynecology;  Laterality: N/A;  Dermoid cyst removal   ROBOTIC ASSISTED LAPAROSCOPIC LYSIS  OF ADHESION N/A 06/24/2024   Procedure: LYSIS, ADHESIONS, ROBOT-ASSISTED, LAPAROSCOPIC;  Surgeon: Glennon Almarie POUR, MD;  Location: [facility] OR;  Service: Gynecology;  Laterality: N/A;   ROBOTIC ASSISTED LAPAROSCOPIC OVARIAN CYSTECTOMY Right 06/24/2024   Procedure: EXCISION, CYST, OVARY, ROBOT-ASSISTED, LAPAROSCOPIC;  Surgeon: Glennon Almarie POUR, MD;  Location: [facility] OR;  Service: Gynecology;  Laterality: Right;   TONSILLECTOMY  1996   WISDOM TOOTH EXTRACTION      Family History  Problem Relation  Age of Onset   Diabetes Mother    Hypertension Mother    Thyroid disease Mother    Hypertension Father    Thyroid cancer Neg Hx     No Known Allergies  Current Medications:   Current Outpatient Medications:    amphetamine -dextroamphetamine  (ADDERALL XR) 25 MG 24 hr capsule, Take 1 capsule by mouth every morning., Disp: 30 capsule, Rfl: 0   amphetamine -dextroamphetamine  (ADDERALL XR) 25 MG 24 hr capsule, Take 1 capsule by mouth every morning., Disp: 30 capsule, Rfl: 0   amphetamine -dextroamphetamine  (ADDERALL XR) 25 MG 24 hr capsule, Take 1 capsule by mouth every morning., Disp: 30 capsule, Rfl: 0   B Complex CAPS, Take 1 capsule by mouth daily., Disp: , Rfl:    buPROPion  (WELLBUTRIN  SR) 150 MG 12 hr tablet, Take 1 tablet (150 mg total) by mouth 2 (two) times daily., Disp: 180 tablet, Rfl: 1   cholecalciferol (VITAMIN D3) 25 MCG (1000 UNIT) tablet, Take 1,000 Units by mouth daily., Disp: , Rfl:    conjugated estrogens  (PREMARIN ) vaginal cream, Place 1/2 gram vaginally twice weekly, Disp: 30 g, Rfl: 6   Continuous Glucose Sensor (FREESTYLE LIBRE 3 PLUS SENSOR) MISC, Change sensor every 15 days., Disp: 2 each, Rfl: 2   cyanocobalamin  (VITAMIN B12) 1000 MCG/ML injection, Inject 1 ml into the muscle for weekly for 3 weeks then monthly, Disp: 10 mL, Rfl: 0   LORazepam  (ATIVAN ) 0.5 MG tablet, Take 1 tablet (0.5 mg total) by mouth daily as needed., Disp: 20 tablet, Rfl: 0   Olopatadine  HCl 0.2 % SOLN, Apply 1 drop to eye 2 (two) times daily., Disp: 2.5 mL, Rfl: 0   tirzepatide  (MOUNJARO ) 10 MG/0.5ML Pen, Inject 10 mg into the skin once a week., Disp: 6 mL, Rfl: 0   Vilazodone  HCl (VIIBRYD ) 40 MG TABS, Take 1 tablet (40 mg total) by mouth daily., Disp: 90 tablet, Rfl: 1   Review of Systems:   Negative unless otherwise specified per HPI.  Vitals:   Vitals:   08/31/24 0833  BP: 130/80  Pulse: 88  Temp: 98.2 F (36.8 C)  TempSrc: Temporal  SpO2: 99%  Weight: 284 lb (128.8 kg)  Height:  5' 5.5 (1.664 m)     Body mass index is 46.54 kg/m.  Physical Exam:   Physical Exam Vitals and nursing note reviewed.  Constitutional:      General: She is not in acute distress.    Appearance: She is well-developed. She is not ill-appearing or toxic-appearing.  HENT:     Head: Normocephalic and atraumatic.     Right Ear: Ear canal and external ear normal. A middle ear effusion is present. Tympanic membrane is not erythematous, retracted or bulging.     Left Ear: Ear canal and external ear normal. A middle ear effusion is present. Tympanic membrane is not erythematous, retracted or bulging.     Nose: Nose normal.     Right Sinus: No maxillary sinus tenderness or frontal sinus tenderness.     Left Sinus: No maxillary  sinus tenderness or frontal sinus tenderness.     Mouth/Throat:     Pharynx: Uvula midline. No posterior oropharyngeal erythema.  Eyes:     General: Lids are normal.     Conjunctiva/sclera: Conjunctivae normal.  Neck:     Trachea: Trachea normal.  Cardiovascular:     Rate and Rhythm: Normal rate and regular rhythm.     Heart sounds: Normal heart sounds, S1 normal and S2 normal.  Pulmonary:     Effort: Pulmonary effort is normal.     Breath sounds: Normal breath sounds. No decreased breath sounds, wheezing, rhonchi or rales.  Lymphadenopathy:     Cervical: No cervical adenopathy.  Skin:    General: Skin is warm and dry.  Neurological:     Mental Status: She is alert.  Psychiatric:        Speech: Speech normal.        Behavior: Behavior normal. Behavior is cooperative.     Assessment and Plan:   Assessment and Plan    Post-viral cough syndrome Recurrent COVID-19 infection within 45 days. Current episode with congestion and crackling on exhalation, improving. Negative COVID-19 test as of yesterday. Monitoring for potential rebound COVID-19. - Monitor symptoms for potential rebound COVID-19. - Consider Mucinex if crackling persists. - Use amoxicillin  if  symptoms worsen.       Lucie Buttner, PA-C

## 2024-09-01 ENCOUNTER — Ambulatory Visit (INDEPENDENT_AMBULATORY_CARE_PROVIDER_SITE_OTHER): Admitting: Obstetrics and Gynecology

## 2024-09-01 VITALS — BP 110/62 | HR 77 | Wt 283.4 lb

## 2024-09-01 DIAGNOSIS — Z09 Encounter for follow-up examination after completed treatment for conditions other than malignant neoplasm: Secondary | ICD-10-CM

## 2024-09-01 NOTE — Progress Notes (Signed)
 Patient presents for 10 week postop from [facility], bilateral salpingectomy, cystoscopy. She is doing well. No fevers, VB, dysuria or severe abdominal pain.  BP 110/62   Pulse 77   Wt 283 lb 6.4 oz (128.5 kg)   LMP 05/27/2024 (Exact Date)   SpO2 100%   BMI 46.44 kg/m   SVE: sutures dissolved, good cuff support, nontender to qtip palpation, normal discharge, no bleeding  A/p PO from [facility] 10 weeks doing well Resume activities 2. May resume intercourse if she desires. May continue PV estrogen cream 3 times a week. Counseled on benefits. 3. Encouraged annual mammograms and resume annual care. RTC with any concerns  Dr. Glennon

## 2024-09-07 ENCOUNTER — Other Ambulatory Visit: Payer: Self-pay | Admitting: Physician Assistant

## 2024-09-07 NOTE — Telephone Encounter (Signed)
 Pt requesting refill for Adderall XR 25 mg. Last OV 08/31/2024, but last visit for ADHD was 05/18/2024.

## 2024-09-08 ENCOUNTER — Other Ambulatory Visit (HOSPITAL_COMMUNITY): Payer: Self-pay

## 2024-09-08 MED ORDER — AMPHETAMINE-DEXTROAMPHET ER 25 MG PO CP24
25.0000 mg | ORAL_CAPSULE | ORAL | 0 refills | Status: DC
  Filled 2024-09-08: qty 30, 30d supply, fill #0

## 2024-09-09 DIAGNOSIS — F411 Generalized anxiety disorder: Secondary | ICD-10-CM | POA: Diagnosis not present

## 2024-09-11 ENCOUNTER — Other Ambulatory Visit (HOSPITAL_COMMUNITY): Payer: Self-pay

## 2024-09-20 DIAGNOSIS — F411 Generalized anxiety disorder: Secondary | ICD-10-CM | POA: Diagnosis not present

## 2024-10-09 ENCOUNTER — Other Ambulatory Visit: Payer: Self-pay | Admitting: Physician Assistant

## 2024-10-10 ENCOUNTER — Other Ambulatory Visit (HOSPITAL_COMMUNITY): Payer: Self-pay

## 2024-10-10 ENCOUNTER — Other Ambulatory Visit: Payer: Self-pay

## 2024-10-10 MED ORDER — FREESTYLE LIBRE 3 PLUS SENSOR MISC
5 refills | Status: DC
  Filled 2024-10-10: qty 2, 30d supply, fill #0

## 2024-10-11 ENCOUNTER — Other Ambulatory Visit: Payer: Self-pay | Admitting: Physician Assistant

## 2024-10-11 ENCOUNTER — Other Ambulatory Visit (HOSPITAL_BASED_OUTPATIENT_CLINIC_OR_DEPARTMENT_OTHER): Payer: Self-pay

## 2024-10-11 DIAGNOSIS — F411 Generalized anxiety disorder: Secondary | ICD-10-CM | POA: Diagnosis not present

## 2024-10-11 MED ORDER — AMPHETAMINE-DEXTROAMPHET ER 25 MG PO CP24
25.0000 mg | ORAL_CAPSULE | ORAL | 0 refills | Status: DC
  Filled 2024-10-11: qty 30, 30d supply, fill #0

## 2024-10-11 NOTE — Telephone Encounter (Signed)
 Pt needs refill for Adderall XR 25 mg capsule. Appt scheduled for 11/25.

## 2024-10-12 NOTE — Progress Notes (Signed)
 Cardiology Office Note:    Date:  10/18/2024   ID:  Tanya Dorsey, DOB [birth year], MRN [id number]  PCP:  Job Lukes, PA   [facility] Providers Cardiologist:  None     Referring MD: Job Lukes, PA   Chief Complaint  Patient presents with   Abnormal ECG    History of Present Illness:    Tanya Dorsey is a 38 y.o. adult is seen at the request of Lukes Job PA for evaluation of abnormal Ecg. She has a history of DM type 2, ADHD, anxiety. She recently underwent hysterectomy. Pre op Ecg showed possible septal infarct age undetermined. She has no prior cardiac history. Denies any chest pain or SOB. Is active and enjoys power lifting. No old Ecg for comparison.   Past Medical History:  Diagnosis Date   ADHD    Anemia    Anxiety    Arthritis    B12 deficiency    Depression    Dysmenorrhea    Elevated blood pressure reading in office without diagnosis of hypertension    Eustachian tube dysfunction, bilateral    ENT-- dr forbes. formeister ([facility])  in office s/p bilateral myringotomies tympanotomy ear tube placement   (06-20-2024  pt stated both tubes are out)   Menorrhagia    Type 2 diabetes mellitus (HCC)    followed by pcp :  treated w/ mounjaro   (06-20-2024  checks blood sugar at home ,  has Libre 3plus ,  fasting average 90--150)   Vitamin D  deficiency    Wears contact lenses    Wears hearing aid in both ears     Past Surgical History:  Procedure Laterality Date   CESAREAN SECTION N/A 08/02/2014   Procedure: CESAREAN SECTION;  Surgeon: Nena DELENA App, MD;  Location: [facility];  Service: Obstetrics;  Laterality: N/A;   CYSTOSCOPY N/A 06/24/2024   Procedure: CYSTOSCOPY;  Surgeon: Glennon Almarie POUR, MD;  Location: [facility] OR;  Service: Gynecology;  Laterality: N/A;   HYSTERECTOMY, TOTAL, LAPAROSCOPIC, ROBOT-ASSISTED WITH SALPINGECTOMY Bilateral 06/24/2024   Procedure: HYSTERECTOMY, TOTAL, LAPAROSCOPIC, ROBOT-ASSISTED WITH SALPINGECTOMY;  Surgeon: Glennon Almarie POUR, MD;  Location: [facility] OR;  Service: Gynecology;  Laterality: Bilateral;   LAPAROSCOPIC CHOLECYSTECTOMY  2009   OVARIAN CYST REMOVAL N/A 06/24/2024   Procedure: EXCISION, CYST, OVARY;  Surgeon: Glennon Almarie POUR, MD;  Location: [facility] OR;  Service: Gynecology;  Laterality: N/A;  Dermoid cyst removal   ROBOTIC ASSISTED LAPAROSCOPIC LYSIS OF ADHESION N/A 06/24/2024   Procedure: LYSIS, ADHESIONS, ROBOT-ASSISTED, LAPAROSCOPIC;  Surgeon: Glennon Almarie POUR, MD;  Location: [facility] OR;  Service: Gynecology;  Laterality: N/A;   ROBOTIC ASSISTED LAPAROSCOPIC OVARIAN CYSTECTOMY Right 06/24/2024   Procedure: EXCISION, CYST, OVARY, ROBOT-ASSISTED, LAPAROSCOPIC;  Surgeon: Glennon Almarie POUR, MD;  Location: [facility] OR;  Service: Gynecology;  Laterality: Right;   TONSILLECTOMY  1996   WISDOM TOOTH EXTRACTION      Current Medications: Current Meds  Medication Sig   amphetamine -dextroamphetamine  (ADDERALL XR) 25 MG 24 hr capsule Take 1 capsule by mouth every morning.   B Complex CAPS Take 1 capsule by mouth daily.   buPROPion  (WELLBUTRIN  SR) 150 MG 12 hr tablet Take 1 tablet (150 mg total) by mouth 2 (two) times daily.   cholecalciferol (VITAMIN D3) 25 MCG (1000 UNIT) tablet Take 1,000 Units by mouth daily.   conjugated estrogens  (PREMARIN ) vaginal cream Place 1/2 gram vaginally twice weekly   LORazepam  (ATIVAN ) 0.5 MG tablet Take 1 tablet (0.5 mg total) by mouth daily as needed.  Olopatadine  HCl 0.2 % SOLN Apply 1 drop to eye 2 (two) times daily.   tirzepatide  (MOUNJARO ) 12.5 MG/0.5ML Pen Inject 12.5 mg into the skin once a week.   Vilazodone  HCl (VIIBRYD ) 40 MG TABS Take 1 tablet (40 mg total) by mouth daily.     Allergies:   Patient has no known allergies.   Social History   Socioeconomic History   Marital status: Married    Spouse name: Not on file   Number of children: 1   Years of education: Not on file   Highest education level: Not on file  Occupational History   Occupation: therapist   Tobacco Use   Smoking status: Never    Passive exposure: Never   Smokeless tobacco: Never  Vaping Use   Vaping status: Never Used  Substance and Sexual Activity   Alcohol use: No   Drug use: Never   Sexual activity: Yes    Partners: Male    Birth control/protection: I.U.D.    Comment: Paragard inserted 2022, menarche 38yo, sexual debut 38yo  Other Topics Concern   Not on file  Social History Narrative   Mental Health counselor   Married   1 child -- 34 yo girl   Social Drivers of Corporate Investment Banker Strain: Not on file  Food Insecurity: Not on file  Transportation Needs: Not on file  Physical Activity: Not on file  Stress: Not on file  Social Connections: Not on file     Family History: The patient's family history includes Diabetes in her mother; Hypertension in her father and mother; Thyroid disease in her mother. There is no history of Thyroid cancer.  ROS:   Please see the history of present illness.     All other systems reviewed and are negative.  EKGs/Labs/Other Studies Reviewed:    The following studies were reviewed today: EKG Interpretation Date/Time:  Tuesday October 18 2024 14:00:25 EST Ventricular Rate:  96 PR Interval:  142 QRS Duration:  94 QT Interval:  326 QTC Calculation: 411 R Axis:   42  Text Interpretation: Normal sinus rhythm   EKG WITHIN NORMAL LIMITS When compared with ECG of 21-Jun-2024 11:53, No significant change was found  Confirmed by Thompson Mckim [phone]) on 10/18/2024 2:12:40 PM   EKG Interpretation Date/Time:  Tuesday October 18 2024 14:00:25 EST Ventricular Rate:  96 PR Interval:  142 QRS Duration:  94 QT Interval:  326 QTC Calculation: 411 R Axis:   42  Text Interpretation: Normal sinus rhythm   EKG WITHIN NORMAL LIMITS When compared with ECG of 21-Jun-2024 11:53, No significant change was found  Confirmed by Courtland Coppa [phone]) on 10/18/2024 2:12:40 PM    Recent Labs: 06/21/2024: Hemoglobin 13.8;  Platelets 384 10/18/2024: ALT 27; BUN 16; Creatinine, Ser 0.84; Potassium 4.2; Sodium 137  Recent Lipid Panel    Component Value Date/Time   CHOL 172 08/11/2023 0941   TRIG 171.0 (H) 08/11/2023 0941   HDL 54.00 08/11/2023 0941   CHOLHDL 3 08/11/2023 0941   VLDL 34.2 08/11/2023 0941   LDLCALC 84 08/11/2023 0941     Risk Assessment/Calculations:                Physical Exam:    VS:  BP 126/74 (BP Location: Left Arm, Patient Position: Sitting, Cuff Size: Large)   Pulse 96   Ht 5' 4.5 (1.638 m)   Wt 292 lb 6.4 oz (132.6 kg)   LMP 05/27/2024 (Exact Date)   SpO2 99%  BMI 49.42 kg/m     Wt Readings from Last 3 Encounters:  10/18/24 292 lb 6.4 oz (132.6 kg)  10/18/24 291 lb (132 kg)  09/01/24 283 lb 6.4 oz (128.5 kg)     GEN:  Well nourished, well developed in no acute distress HEENT: Normal NECK: No JVD; No carotid bruits LYMPHATICS: No lymphadenopathy CARDIAC: RRR, no murmurs, rubs, gallops RESPIRATORY:  Clear to auscultation without rales, wheezing or rhonchi  ABDOMEN: Soft, non-tender, non-distended MUSCULOSKELETAL:  No edema; No deformity  SKIN: Warm and dry NEUROLOGIC:  Alert and oriented x 3 PSYCHIATRIC:  Normal affect   ASSESSMENT:    1. Nonspecific abnormal electrocardiogram (ECG) (EKG)    PLAN:    In order of problems listed above:  I personally reviewed her Ecgs and I think her Ecg is normal for her body habitus. No diagnostic Q waves. No history to suggest MI. Patient reassured. No further work up required.            Medication Adjustments/Labs and Tests Ordered: Current medicines are reviewed at length with the patient today.  Concerns regarding medicines are outlined above.  Orders Placed This Encounter  Procedures   EKG 12-Lead   No orders of the defined types were placed in this encounter.   There are no Patient Instructions on file for this visit.   Signed, Puneet Masoner, MD  10/18/2024 2:12 PM    [facility]

## 2024-10-17 ENCOUNTER — Encounter (HOSPITAL_COMMUNITY): Payer: Self-pay

## 2024-10-18 ENCOUNTER — Other Ambulatory Visit: Payer: Self-pay

## 2024-10-18 ENCOUNTER — Encounter: Payer: Self-pay | Admitting: Cardiology

## 2024-10-18 ENCOUNTER — Other Ambulatory Visit (HOSPITAL_COMMUNITY): Payer: Self-pay

## 2024-10-18 ENCOUNTER — Telehealth (HOSPITAL_COMMUNITY): Payer: Self-pay | Admitting: Pharmacist

## 2024-10-18 ENCOUNTER — Ambulatory Visit: Payer: Self-pay | Admitting: Physician Assistant

## 2024-10-18 ENCOUNTER — Encounter: Payer: Self-pay | Admitting: Physician Assistant

## 2024-10-18 ENCOUNTER — Ambulatory Visit: Attending: Cardiology | Admitting: Cardiology

## 2024-10-18 ENCOUNTER — Ambulatory Visit: Admitting: Physician Assistant

## 2024-10-18 VITALS — BP 126/74 | HR 96 | Ht 64.5 in | Wt 292.4 lb

## 2024-10-18 VITALS — BP 136/80 | HR 93 | Temp 98.4°F | Ht 65.5 in | Wt 291.0 lb

## 2024-10-18 DIAGNOSIS — E119 Type 2 diabetes mellitus without complications: Secondary | ICD-10-CM

## 2024-10-18 DIAGNOSIS — Z7985 Long-term (current) use of injectable non-insulin antidiabetic drugs: Secondary | ICD-10-CM | POA: Diagnosis not present

## 2024-10-18 DIAGNOSIS — R9431 Abnormal electrocardiogram [ECG] [EKG]: Secondary | ICD-10-CM

## 2024-10-18 DIAGNOSIS — R37 Sexual dysfunction, unspecified: Secondary | ICD-10-CM

## 2024-10-18 DIAGNOSIS — F988 Other specified behavioral and emotional disorders with onset usually occurring in childhood and adolescence: Secondary | ICD-10-CM | POA: Diagnosis not present

## 2024-10-18 LAB — COMPREHENSIVE METABOLIC PANEL WITH GFR
ALT: 27 U/L (ref 0–35)
AST: 16 U/L (ref 0–37)
Albumin: 4.3 g/dL (ref 3.5–5.2)
Alkaline Phosphatase: 74 U/L (ref 39–117)
BUN: 16 mg/dL (ref 6–23)
CO2: 27 meq/L (ref 19–32)
Calcium: 9.2 mg/dL (ref 8.4–10.5)
Chloride: 102 meq/L (ref 96–112)
Creatinine, Ser: 0.84 mg/dL (ref 0.40–1.20)
GFR: 87.52 mL/min (ref >60.00)
Glucose, Bld: 110 mg/dL — ABNORMAL HIGH (ref 70–99)
Potassium: 4.2 meq/L (ref 3.5–5.1)
Sodium: 137 meq/L (ref 135–145)
Total Bilirubin: 0.4 mg/dL (ref 0.2–1.2)
Total Protein: 6.9 g/dL (ref 6.0–8.3)

## 2024-10-18 LAB — MICROALBUMIN / CREATININE URINE RATIO
Creatinine,U: 113.6 mg/dL
Microalb Creat Ratio: UNDETERMINED mg/g (ref 0.0–30.0)
Microalb, Ur: <0.7 mg/dL

## 2024-10-18 LAB — POCT GLYCOSYLATED HEMOGLOBIN (HGB A1C): Hemoglobin A1C: 5.5 % (ref 4.0–5.6)

## 2024-10-18 MED ORDER — TIRZEPATIDE 12.5 MG/0.5ML ~~LOC~~ SOAJ
12.5000 mg | SUBCUTANEOUS | 1 refills | Status: AC
  Filled 2024-10-18 – 2024-10-24 (×2): qty 2, 28d supply, fill #0
  Filled 2024-12-02: qty 2, 28d supply, fill #1

## 2024-10-18 NOTE — Patient Instructions (Signed)

## 2024-10-18 NOTE — Progress Notes (Signed)
 Tanya Dorsey is a 38 y.o. adult here for a follow up of a pre-existing problem.  History of Present Illness:   Chief Complaint  Patient presents with   ADD    Pt here for f/u, currently taking Adderall Xr 25 mg. Tolerating well.   Weight Management Screening    Pt here for f/u, currently taking Mounjaro  10 mg weekly.    Discussed the use of AI scribe software for clinical note transcription with the patient, who gave verbal consent to proceed.  History of Present Illness   Tanya Dorsey is a 38 year old with type 2 diabetes who presents with concerns about elevated blood sugar levels.  Their blood sugar has been less controlled since stopping Ozempic  around the time of surgery and reducing physical activity. Fasting readings are now 100 to 120 mg/dL compared to prior values under 100 mg/dL, and postprandial levels often rise to 150 to 200 mg/dL. They are taking Mounjaro  10 mg weekly. The last A1c in March was 5.6%.  They have night sweats that started before surgery, which they do not link to their blood sugar. They previously used SSRIs and SNRIs and currently takes Adderall and Viibryd .  They have ongoing sexual dysfunction that they associates with Viibryd  and is considering a dosage change. They note that without Adderall they feel slower and more forgetful but has not stopped it for a full week to see if symptoms adjust.  They report cyclical symptoms that they attribute to hormones despite not having periods after surgery. They have avoided hormone therapy because they feels their body does not respond well to hormones.        Past Medical History:  Diagnosis Date   ADHD    Anemia    Anxiety    Arthritis    B12 deficiency    Depression    Dysmenorrhea    Elevated blood pressure reading in office without diagnosis of hypertension    Eustachian tube dysfunction, bilateral    ENT-- dr forbes. forme[facility] ([facility])  in office s/p bilateral myringotomies tympanotomy ear  tube placement   (06-20-2024  pt stated both tubes are out)   Menorrhagia    Type 2 diabetes mellitus (HCC)    followed by pcp :  treated w/ mounjaro   (06-20-2024  checks blood sugar at home ,  has Libre 3plus ,  fasting average 90--150)   Vitamin D  deficiency    Wears contact lenses    Wears hearing aid in both ears      Social History   Tobacco Use   Smoking status: Never    Passive exposure: Never   Smokeless tobacco: Never  Vaping Use   Vaping status: Never Used  Substance Use Topics   Alcohol use: No   Drug use: Never    Past Surgical History:  Procedure Laterality Date   CESAREAN SECTION N/A 08/02/2014   Procedure: CESAREAN SECTION;  Surgeon: Nena DELENA App, MD;  Location: [facility];  Service: Obstetrics;  Laterality: N/A;   CYSTOSCOPY N/A 06/24/2024   Procedure: CYSTOSCOPY;  Surgeon: Glennon Almarie POUR, MD;  Location: [facility] OR;  Service: Gynecology;  Laterality: N/A;   HYSTERECTOMY, TOTAL, LAPAROSCOPIC, ROBOT-ASSISTED WITH SALPINGECTOMY Bilateral 06/24/2024   Procedure: HYSTERECTOMY, TOTAL, LAPAROSCOPIC, ROBOT-ASSISTED WITH SALPINGECTOMY;  Surgeon: Glennon Almarie POUR, MD;  Location: [facility] OR;  Service: Gynecology;  Laterality: Bilateral;   LAPAROSCOPIC CHOLECYSTECTOMY  2009   OVARIAN CYST REMOVAL N/A 06/24/2024   Procedure: EXCISION, CYST, OVARY;  Surgeon: Glennon Almarie POUR,  MD;  Location: [facility] OR;  Service: Gynecology;  Laterality: N/A;  Dermoid cyst removal   ROBOTIC ASSISTED LAPAROSCOPIC LYSIS OF ADHESION N/A 06/24/2024   Procedure: LYSIS, ADHESIONS, ROBOT-ASSISTED, LAPAROSCOPIC;  Surgeon: Glennon Almarie POUR, MD;  Location: [facility] OR;  Service: Gynecology;  Laterality: N/A;   ROBOTIC ASSISTED LAPAROSCOPIC OVARIAN CYSTECTOMY Right 06/24/2024   Procedure: EXCISION, CYST, OVARY, ROBOT-ASSISTED, LAPAROSCOPIC;  Surgeon: Glennon Almarie POUR, MD;  Location: [facility] OR;  Service: Gynecology;  Laterality: Right;   TONSILLECTOMY  1996   WISDOM TOOTH EXTRACTION      Family History  Problem  Relation Age of Onset   Diabetes Mother    Hypertension Mother    Thyroid disease Mother    Hypertension Father    Thyroid cancer Neg Hx     No Known Allergies  Current Medications:   Current Outpatient Medications:    amphetamine -dextroamphetamine  (ADDERALL XR) 25 MG 24 hr capsule, Take 1 capsule by mouth every morning., Disp: 30 capsule, Rfl: 0   B Complex CAPS, Take 1 capsule by mouth daily., Disp: , Rfl:    buPROPion  (WELLBUTRIN  SR) 150 MG 12 hr tablet, Take 1 tablet (150 mg total) by mouth 2 (two) times daily., Disp: 180 tablet, Rfl: 1   cholecalciferol (VITAMIN D3) 25 MCG (1000 UNIT) tablet, Take 1,000 Units by mouth daily., Disp: , Rfl:    conjugated estrogens  (PREMARIN ) vaginal cream, Place 1/2 gram vaginally twice weekly, Disp: 30 g, Rfl: 6   LORazepam  (ATIVAN ) 0.5 MG tablet, Take 1 tablet (0.5 mg total) by mouth daily as needed., Disp: 20 tablet, Rfl: 0   Olopatadine  HCl 0.2 % SOLN, Apply 1 drop to eye 2 (two) times daily., Disp: 2.5 mL, Rfl: 0   tirzepatide  (MOUNJARO ) 12.5 MG/0.5ML Pen, Inject 12.5 mg into the skin once a week., Disp: 6 mL, Rfl: 1   Vilazodone  HCl (VIIBRYD ) 40 MG TABS, Take 1 tablet (40 mg total) by mouth daily., Disp: 90 tablet, Rfl: 1   Review of Systems:   Negative unless otherwise specified per HPI.  Vitals:   Vitals:   10/18/24 0820  BP: 136/80  Pulse: 93  Temp: 98.4 F (36.9 C)  TempSrc: Temporal  SpO2: 99%  Weight: 291 lb (132 kg)  Height: 5' 5.5 (1.664 m)     Body mass index is 47.69 kg/m.  Physical Exam:   Physical Exam Vitals and nursing note reviewed.  Constitutional:      General: She is not in acute distress.    Appearance: She is well-developed. She is not ill-appearing or toxic-appearing.  Cardiovascular:     Rate and Rhythm: Normal rate and regular rhythm.     Pulses: Normal pulses.     Heart sounds: Normal heart sounds, S1 normal and S2 normal.  Pulmonary:     Effort: Pulmonary effort is normal.     Breath sounds:  Normal breath sounds.  Skin:    General: Skin is warm and dry.  Neurological:     Mental Status: She is alert.     GCS: GCS eye subscore is 4. GCS verbal subscore is 5. GCS motor subscore is 6.  Psychiatric:        Speech: Speech normal.        Behavior: Behavior normal. Behavior is cooperative.    Results for orders placed or performed in visit on 10/18/24  POCT HgB A1C  Result Value Ref Range   Hemoglobin A1C 5.5 4.0 - 5.6 %     Assessment and Plan:  Assessment and Plan    Type 2 diabetes mellitus Elevated blood glucose levels with fasting 100-120 mg/dL and postprandial 849-799 mg/dL. A1c was 5.6% in March. Mounjaro  10 mg weekly is well-tolerated. No hypoglycemia. Night sweats not linked to glucose levels. GLP-1 receptor agonists preferred over metformin . - Increased Mounjaro  dosage. - A1c is great at 5.5 - Checked urine for protein.  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 Previous switch to Viibryd  ineffective. Discussed dose-dependent sexual side effects and trial of lower dose. - Trial dose reduction of Viibryd  by cutting tablets in half. - Reach out we can adjust accordingly  Adderall XR effective but increases pulse and blood pressure at night. Considering Vyvanse for smoother effects. Discussed benefits of Vyvanse for symptom management. - Consider switching from Adderall XR to Vyvanse. - Discuss potential switch to Vyvanse with provider if interested.  General Health Maintenance Discussed hormonal therapy for night sweats. Advised against systemic estrogen due to age and history. Night sweats may be related to SSRI/SNRI use. - Continue to monitor symptoms and consider hormonal therapy if severe vasomotor symptoms develop.      Follow up in 3 months, sooner if concerns   Lucie Buttner, PA-C

## 2024-10-19 ENCOUNTER — Other Ambulatory Visit (HOSPITAL_COMMUNITY): Payer: Self-pay

## 2024-10-21 ENCOUNTER — Other Ambulatory Visit (HOSPITAL_COMMUNITY): Payer: Self-pay

## 2024-10-21 ENCOUNTER — Telehealth (HOSPITAL_COMMUNITY): Payer: Self-pay

## 2024-10-21 NOTE — Telephone Encounter (Signed)
 PA request has been Received. New Encounter has been or will be created for follow up. For additional info see Pharmacy Prior Auth telephone encounter from 10/21/24.

## 2024-10-21 NOTE — Telephone Encounter (Signed)
 Pharmacy Patient Advocate Encounter   Received notification from Pt Calls Messages that prior authorization for Mounjaro  12.5MG /0.5ML auto-injectors  is required/requested.   Insurance verification completed.   The patient is insured through [facility]   Per test claim: PA required; PA submitted to above mentioned insurance via Latent Key/confirmation #/EOC [id number] Status is pending

## 2024-10-24 ENCOUNTER — Other Ambulatory Visit (HOSPITAL_COMMUNITY): Payer: Self-pay

## 2024-10-24 ENCOUNTER — Other Ambulatory Visit (HOSPITAL_BASED_OUTPATIENT_CLINIC_OR_DEPARTMENT_OTHER): Payer: Self-pay

## 2024-10-24 NOTE — Telephone Encounter (Signed)
 Pharmacy Patient Advocate Encounter  Received notification from [facility] that Prior Authorization for Mounjaro  12.5mg /0.20ml has been APPROVED from 10/21/24 to 10/21/25   PA #/Case ID/Reference #: [id number]  Approval letter indexed to media tab

## 2024-10-25 ENCOUNTER — Other Ambulatory Visit: Payer: Self-pay

## 2024-11-02 DIAGNOSIS — F411 Generalized anxiety disorder: Secondary | ICD-10-CM | POA: Diagnosis not present

## 2024-11-13 ENCOUNTER — Other Ambulatory Visit: Payer: Self-pay | Admitting: Physician Assistant

## 2024-11-14 ENCOUNTER — Other Ambulatory Visit (HOSPITAL_COMMUNITY): Payer: Self-pay

## 2024-11-14 ENCOUNTER — Other Ambulatory Visit: Payer: Self-pay | Admitting: Physician Assistant

## 2024-11-14 MED ORDER — AMPHETAMINE-DEXTROAMPHET ER 25 MG PO CP24: 25.0000 mg | ORAL_CAPSULE | ORAL | 0 refills | Status: AC

## 2024-11-14 MED ORDER — AMPHETAMINE-DEXTROAMPHET ER 25 MG PO CP24
25.0000 mg | ORAL_CAPSULE | ORAL | 0 refills | Status: DC
  Filled 2024-12-15: qty 30, 30d supply, fill #0

## 2024-11-14 MED ORDER — AMPHETAMINE-DEXTROAMPHET ER 25 MG PO CP24
25.0000 mg | ORAL_CAPSULE | ORAL | 0 refills | Status: DC
  Filled 2024-11-14: qty 30, 30d supply, fill #0

## 2024-11-14 NOTE — Telephone Encounter (Signed)
 Please review

## 2024-12-05 ENCOUNTER — Encounter: Payer: Self-pay | Admitting: *Deleted

## 2024-12-12 ENCOUNTER — Other Ambulatory Visit: Payer: Self-pay | Admitting: Physician Assistant

## 2024-12-12 ENCOUNTER — Other Ambulatory Visit (HOSPITAL_COMMUNITY): Payer: Self-pay

## 2024-12-12 MED ORDER — BUPROPION HCL ER (SR) 150 MG PO TB12
150.0000 mg | ORAL_TABLET | Freq: Two times a day (BID) | ORAL | 1 refills | Status: AC
  Filled 2024-12-12: qty 180, 90d supply, fill #0

## 2024-12-13 ENCOUNTER — Other Ambulatory Visit (HOSPITAL_COMMUNITY): Payer: Self-pay

## 2024-12-16 ENCOUNTER — Other Ambulatory Visit: Payer: Self-pay

## 2024-12-16 ENCOUNTER — Other Ambulatory Visit (HOSPITAL_COMMUNITY): Payer: Self-pay

## 2024-12-25 ENCOUNTER — Telehealth: Payer: Self-pay | Admitting: Physician Assistant

## 2024-12-25 NOTE — Telephone Encounter (Signed)
 LVM to move 12/27/24 OV to 12/26/24 and change to virtual visit.

## 2024-12-27 ENCOUNTER — Other Ambulatory Visit: Payer: Self-pay

## 2024-12-27 ENCOUNTER — Encounter: Payer: Self-pay | Admitting: Physician Assistant

## 2024-12-27 ENCOUNTER — Ambulatory Visit: Admitting: Physician Assistant

## 2024-12-27 ENCOUNTER — Other Ambulatory Visit (HOSPITAL_COMMUNITY): Payer: Self-pay

## 2024-12-27 VITALS — BP 138/88 | HR 87 | Temp 98.1°F | Ht 64.5 in | Wt 293.0 lb

## 2024-12-27 DIAGNOSIS — F988 Other specified behavioral and emotional disorders with onset usually occurring in childhood and adolescence: Secondary | ICD-10-CM | POA: Diagnosis not present

## 2024-12-27 DIAGNOSIS — E559 Vitamin D deficiency, unspecified: Secondary | ICD-10-CM

## 2024-12-27 DIAGNOSIS — F5101 Primary insomnia: Secondary | ICD-10-CM

## 2024-12-27 DIAGNOSIS — F419 Anxiety disorder, unspecified: Secondary | ICD-10-CM

## 2024-12-27 DIAGNOSIS — Z7985 Long-term (current) use of injectable non-insulin antidiabetic drugs: Secondary | ICD-10-CM | POA: Diagnosis not present

## 2024-12-27 DIAGNOSIS — F32A Depression, unspecified: Secondary | ICD-10-CM

## 2024-12-27 DIAGNOSIS — R61 Generalized hyperhidrosis: Secondary | ICD-10-CM

## 2024-12-27 DIAGNOSIS — E119 Type 2 diabetes mellitus without complications: Secondary | ICD-10-CM | POA: Diagnosis not present

## 2024-12-27 LAB — CBC WITH DIFFERENTIAL/PLATELET
Basophils Absolute: 0 10*3/uL (ref 0.0–0.1)
Basophils Relative: 0.3 % (ref 0.0–3.0)
Eosinophils Absolute: 0.1 10*3/uL (ref 0.0–0.7)
Eosinophils Relative: 1.4 % (ref 0.0–5.0)
HCT: 39.9 % (ref 36.0–46.0)
Hemoglobin: 13.3 g/dL (ref 12.0–15.0)
Lymphocytes Relative: 26.2 % (ref 12.0–46.0)
Lymphs Abs: 1.9 10*3/uL (ref 0.7–4.0)
MCHC: 33.4 g/dL (ref 30.0–36.0)
MCV: 84.5 fl (ref 78.0–100.0)
Monocytes Absolute: 0.7 10*3/uL (ref 0.1–1.0)
Monocytes Relative: 9.4 % (ref 3.0–12.0)
Neutro Abs: 4.4 10*3/uL (ref 1.4–7.7)
Neutrophils Relative %: 62.7 % (ref 43.0–77.0)
Platelets: 343 10*3/uL (ref 150.0–400.0)
RBC: 4.72 Mil/uL (ref 3.87–5.11)
RDW: 13.8 % (ref 11.5–15.5)
WBC: 7.1 10*3/uL (ref 4.0–10.5)

## 2024-12-27 LAB — COMPREHENSIVE METABOLIC PANEL WITH GFR
ALT: 25 U/L (ref 3–35)
AST: 17 U/L (ref 5–37)
Albumin: 4 g/dL (ref 3.5–5.2)
Alkaline Phosphatase: 89 U/L (ref 39–117)
BUN: 15 mg/dL (ref 6–23)
CO2: 25 meq/L (ref 19–32)
Calcium: 9 mg/dL (ref 8.4–10.5)
Chloride: 103 meq/L (ref 96–112)
Creatinine, Ser: 0.88 mg/dL (ref 0.40–1.20)
GFR: 82.66 mL/min
Glucose, Bld: 95 mg/dL (ref 70–99)
Potassium: 4.5 meq/L (ref 3.5–5.1)
Sodium: 136 meq/L (ref 135–145)
Total Bilirubin: 0.2 mg/dL (ref 0.2–1.2)
Total Protein: 7.1 g/dL (ref 6.0–8.3)

## 2024-12-27 LAB — IBC + FERRITIN
Ferritin: 12.2 ng/mL (ref 10.0–291.0)
Iron: 71 ug/dL (ref 42–145)
Saturation Ratios: 15.6 % — ABNORMAL LOW (ref 20.0–50.0)
TIBC: 455 ug/dL — ABNORMAL HIGH (ref 250.0–450.0)
Transferrin: 325 mg/dL (ref 212.0–360.0)

## 2024-12-27 LAB — VITAMIN B12: Vitamin B-12: 779 pg/mL (ref 211–911)

## 2024-12-27 LAB — TSH: TSH: 1.69 u[IU]/mL (ref 0.35–5.50)

## 2024-12-27 LAB — VITAMIN D 25 HYDROXY (VIT D DEFICIENCY, FRACTURES): VITD: 29.13 ng/mL — ABNORMAL LOW (ref 30.00–100.00)

## 2024-12-27 MED ORDER — VILAZODONE HCL 20 MG PO TABS
20.0000 mg | ORAL_TABLET | Freq: Every day | ORAL | 1 refills | Status: AC
  Filled 2024-12-27: qty 90, 90d supply, fill #0

## 2024-12-27 MED ORDER — FREESTYLE LIBRE 3 PLUS SENSOR MISC
3 refills | Status: AC
  Filled 2024-12-27: qty 6, 90d supply, fill #0

## 2024-12-28 ENCOUNTER — Ambulatory Visit: Payer: Self-pay | Admitting: Physician Assistant

## 2025-05-12 ENCOUNTER — Ambulatory Visit (HOSPITAL_BASED_OUTPATIENT_CLINIC_OR_DEPARTMENT_OTHER): Admitting: Radiology
